# Patient Record
Sex: Male | Born: 1980 | Race: White | Hispanic: No | Marital: Married | Smoking: Current every day smoker
Health system: Southern US, Community
[De-identification: ages and names within clinical notes are randomized; demographics above are authoritative.]

## PROBLEM LIST (undated history)

## (undated) DIAGNOSIS — R0683 Snoring: Secondary | ICD-10-CM

## (undated) DIAGNOSIS — E6609 Other obesity due to excess calories: Secondary | ICD-10-CM

## (undated) DIAGNOSIS — G510 Bell's palsy: Secondary | ICD-10-CM

## (undated) DIAGNOSIS — I10 Essential (primary) hypertension: Secondary | ICD-10-CM

## (undated) HISTORY — DX: Snoring: R06.83

## (undated) HISTORY — PX: VASECTOMY: SHX75

## (undated) HISTORY — PX: WISDOM TOOTH EXTRACTION: SHX21

## (undated) HISTORY — DX: Other obesity due to excess calories: E66.09

---

## 2006-01-15 ENCOUNTER — Encounter: Admission: RE | Admit: 2006-01-15 | Discharge: 2006-01-15 | Payer: Self-pay | Admitting: Emergency Medicine

## 2006-02-04 ENCOUNTER — Emergency Department (HOSPITAL_COMMUNITY): Admission: EM | Admit: 2006-02-04 | Discharge: 2006-02-04 | Payer: Self-pay | Admitting: Emergency Medicine

## 2007-08-27 ENCOUNTER — Emergency Department (HOSPITAL_COMMUNITY): Admission: EM | Admit: 2007-08-27 | Discharge: 2007-08-27 | Payer: Self-pay | Admitting: Emergency Medicine

## 2010-02-20 ENCOUNTER — Other Ambulatory Visit: Payer: Self-pay | Admitting: Otolaryngology

## 2010-02-20 DIAGNOSIS — G510 Bell's palsy: Secondary | ICD-10-CM

## 2010-02-27 ENCOUNTER — Ambulatory Visit
Admission: RE | Admit: 2010-02-27 | Discharge: 2010-02-27 | Disposition: A | Discharge status: Home / Self Care | Payer: 59 | Source: Ambulatory Visit | Attending: Otolaryngology | Admitting: Otolaryngology

## 2010-02-27 DIAGNOSIS — G510 Bell's palsy: Secondary | ICD-10-CM

## 2010-02-27 MED ORDER — GADOBENATE DIMEGLUMINE 529 MG/ML IV SOLN
20.0000 mL | Freq: Once | INTRAVENOUS | Status: AC | PRN
  Administered 2010-02-27: 20 mL via INTRAVENOUS

## 2010-03-06 ENCOUNTER — Ambulatory Visit
Admission: RE | Admit: 2010-03-06 | Discharge: 2010-03-06 | Disposition: A | Discharge status: Home / Self Care | Payer: 59 | Source: Ambulatory Visit | Attending: Otolaryngology | Admitting: Otolaryngology

## 2010-03-06 ENCOUNTER — Other Ambulatory Visit: Payer: Self-pay | Admitting: Otolaryngology

## 2010-03-06 DIAGNOSIS — R590 Localized enlarged lymph nodes: Secondary | ICD-10-CM

## 2011-03-29 ENCOUNTER — Ambulatory Visit (INDEPENDENT_AMBULATORY_CARE_PROVIDER_SITE_OTHER): Payer: 59 | Admitting: Internal Medicine

## 2011-03-29 DIAGNOSIS — S335XXA Sprain of ligaments of lumbar spine, initial encounter: Secondary | ICD-10-CM

## 2011-03-29 MED ORDER — MELOXICAM 15 MG PO TABS: 15.0000 mg | ORAL_TABLET | Freq: Every day | ORAL | Status: DC

## 2011-03-29 MED ORDER — CYCLOBENZAPRINE HCL 10 MG PO TABS: 10.0000 mg | ORAL_TABLET | Freq: Every day | ORAL | Status: AC

## 2011-03-29 MED ORDER — HYDROCODONE-ACETAMINOPHEN 7.5-750 MG PO TABS: 1.0000 | ORAL_TABLET | Freq: Four times a day (QID) | ORAL | Status: AC | PRN

## 2011-03-29 NOTE — Progress Notes (Signed)
  Subjective:    Patient ID: Bradley Morales, male    DOB: [birth year], 29 y.o.   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 Also hurts to drive. Pain does not radiate from the low back area    Review of Systems     Objective:   Physical Exam Vital signs are stable/he is in obvious discomfort There is tenderness in the left lower back area over the iliac crest Straight leg raise is negative to 90 and hip exam is normal Pain is reproduced with twisting or forward bend       Assessment & Plan:  Acute low back muscle strain  Flexeril 10 at bedtime Mobic 15 daily Ice/heat/handout given for exercises Hydrocodone 7.5-750 to use every 6 hours as needed Followup one to 2 weeks if not well He prefers to try to continue to do his job without restrictions

## 2011-03-29 NOTE — Patient Instructions (Signed)
Back Exercises Back exercises help treat and prevent back injuries. The goal of back exercises is to increase the strength of your abdominal and back muscles and the flexibility of your back. These exercises should be started when you no longer have back pain. Back exercises include:  Pelvic Tilt. Lie on your back with your knees bent. Tilt your pelvis until the lower part of your back is against the floor. Hold this position 5 to 10 sec and repeat 5 to 10 times.   Knee to Chest. Pull first 1 knee up against your chest and hold for 20 to 30 seconds, repeat this with the other knee, and then both knees. This may be done with the other leg straight or bent, whichever feels better.   Sit-Ups or Curl-Ups. Bend your knees 90 degrees. Start with tilting your pelvis, and do a partial, slow sit-up, lifting your trunk only 30 to 45 degrees off the floor. Take at least 2 to 3 seconds for each sit-up. Do not do sit-ups with your knees out straight. If partial sit-ups are difficult, simply do the above but with only tightening your abdominal muscles and holding it as directed.   Hip-Lift. Lie on your back with your knees flexed 90 degrees. Push down with your feet and shoulders as you raise your hips a couple inches off the floor; hold for 10 seconds, repeat 5 to 10 times.   Back arches. Lie on your stomach, propping yourself up on bent elbows. Slowly press on your hands, causing an arch in your low back. Repeat 3 to 5 times. Any initial stiffness and discomfort should lessen with repetition over time.   Shoulder-Lifts. Lie face down with arms beside your body. Keep hips and torso pressed to floor as you slowly lift your head and shoulders off the floor.  Do not overdo your exercises, especially in the beginning. Exercises may cause you some mild back discomfort which lasts for a few minutes; however, if the pain is more severe, or lasts for more than 15 minutes, do not continue exercises until you see your  caregiver. Improvement with exercise therapy for back problems is slow.  See your caregivers for assistance with developing a proper back exercise program. Document Released: 02/12/2004 Document Revised: 12/24/2010 Document Reviewed: 01/04/2005 [employer] Patient Information 2012 [employer]. 

## 2011-04-11 ENCOUNTER — Ambulatory Visit: Payer: 59

## 2011-04-11 ENCOUNTER — Ambulatory Visit (INDEPENDENT_AMBULATORY_CARE_PROVIDER_SITE_OTHER): Payer: 59 | Admitting: Family Medicine

## 2011-04-11 ENCOUNTER — Encounter: Payer: Self-pay | Admitting: Family Medicine

## 2011-04-11 VITALS — BP 132/84 | HR 62 | Temp 98.3°F | Resp 18

## 2011-04-11 DIAGNOSIS — R55 Syncope and collapse: Secondary | ICD-10-CM

## 2011-04-11 DIAGNOSIS — W19XXXA Unspecified fall, initial encounter: Secondary | ICD-10-CM

## 2011-04-11 DIAGNOSIS — Y92009 Unspecified place in unspecified non-institutional (private) residence as the place of occurrence of the external cause: Secondary | ICD-10-CM

## 2011-04-11 DIAGNOSIS — M545 Low back pain, unspecified: Secondary | ICD-10-CM

## 2011-04-11 DIAGNOSIS — R079 Chest pain, unspecified: Secondary | ICD-10-CM

## 2011-04-11 MED ORDER — LISINOPRIL 40 MG PO TABS: 40.0000 mg | ORAL_TABLET | Freq: Every day | ORAL | Status: DC

## 2011-04-11 MED ORDER — TRAMADOL HCL 50 MG PO TABS
50.0000 mg | ORAL_TABLET | Freq: Three times a day (TID) | ORAL | Status: AC | PRN
Start: 2011-04-11 — End: 2011-04-21

## 2011-04-11 NOTE — Progress Notes (Signed)
31 yo man with hypertension who was voiding after getting up this morning around 10 am and passed out.  He hit left chest and mouth and cut the inside.   He takes atenolol He notes some left anterior rib pain and low back pain. O:  NAD  Patient has through and through lower lip laceration that is 2-3 mm wide and well approximated now on the outside:  dermabonded after cleansed with alcohol and water  No point tenderness or ecchymosis of ribs or back.  Breathing easily and moving well. Chest clear.  [facility] reading (PRIMARY) by  Dr. Milus Glazier  cxr :  neg [facility] reading (PRIMARY) by  Dr. Milus Glazier lumbar 2 view:  Neg   A:  Vasovagal syncope related to atenolol, time of day, and voiding  P:  Change bp med, give pain meds, explain etiology of syncope Increase fluids.

## 2011-11-03 ENCOUNTER — Encounter: Payer: Self-pay | Admitting: Internal Medicine

## 2011-11-03 ENCOUNTER — Ambulatory Visit (INDEPENDENT_AMBULATORY_CARE_PROVIDER_SITE_OTHER): Payer: 59 | Admitting: Internal Medicine

## 2011-11-03 VITALS — BP 122/70 | HR 76 | Temp 98.0°F | Resp 16 | Ht 69.5 in | Wt 321.8 lb

## 2011-11-03 DIAGNOSIS — I10 Essential (primary) hypertension: Secondary | ICD-10-CM

## 2011-11-03 DIAGNOSIS — Z6841 Body Mass Index (BMI) 40.0 and over, adult: Secondary | ICD-10-CM

## 2011-11-03 DIAGNOSIS — F17200 Nicotine dependence, unspecified, uncomplicated: Secondary | ICD-10-CM

## 2011-11-03 DIAGNOSIS — E669 Obesity, unspecified: Secondary | ICD-10-CM

## 2011-11-03 DIAGNOSIS — Z23 Encounter for immunization: Secondary | ICD-10-CM

## 2011-11-03 DIAGNOSIS — J019 Acute sinusitis, unspecified: Secondary | ICD-10-CM

## 2011-11-03 MED ORDER — HYDROCODONE-HOMATROPINE 5-1.5 MG/5ML PO SYRP: 5.0000 mL | ORAL_SOLUTION | Freq: Four times a day (QID) | ORAL | Status: AC | PRN

## 2011-11-03 MED ORDER — AMOXICILLIN 500 MG PO CAPS: 1000.0000 mg | ORAL_CAPSULE | Freq: Two times a day (BID) | ORAL | Status: AC

## 2011-11-03 MED ORDER — LISINOPRIL 20 MG PO TABS: 20.0000 mg | ORAL_TABLET | Freq: Every day | ORAL | Status: DC

## 2011-11-03 NOTE — Progress Notes (Signed)
Subjective:    Patient ID: Bradley Morales, male    DOB: December 12, 1980, 31 y.o.   MRN: [id number]  HPIHere for followup of hypertension/He has had episodes getting up at night where he would be very lightheaded/shows an extensive list of home blood pressure recordings and all are normal/some of the readings are around 105  systolic Also has had a cold with copious postnasal drainage and nocturnal cough for 8-10 days No fever/cough is mostly nonproductive  Continues to smoke almost a pack a day Wife and daughter doing well Work: Well  Review of Systems Has not been able to lose weight   Denies chest pain palpitations shortness of breath edema No headaches or visual changes Objective:   Physical Exam Blood pressure 122/70 Weight 321 pounds-BMI 46 TMs clear Nares with purulent discharge Maxillary sinus tender to percussion Lungs clear to auscultation Heart regular without murmur Extremities with good peripheral pulses and no edema       Assessment & Plan:   Patient Active Problem List  Diagnosis  . HTN (hypertension)  . Nicotine addiction  . BMI 45.0-49.9, adult    -  Sinusitis  Decrease Lisinopril to 20mg /? Hypotensive at night Meds ordered this encounter  Medications  . HYDROcodone-acetaminophen (NORCO/VICODIN) 5-325 MG per tablet    Sig:   . amoxicillin (AMOXIL) 500 MG capsule    Sig: Take 2 capsules (1,000 mg total) by mouth 2 (two) times daily.    Dispense:  40 capsule    Refill:  0  . HYDROcodone-homatropine (HYCODAN) 5-1.5 MG/5ML syrup    Sig: Take 5 mLs by mouth every 6 (six) hours as needed for cough.    Dispense:  120 mL    Refill:  0  . lisinopril (PRINIVIL,ZESTRIL) 20 MG tablet    Sig: Take 1 tablet (20 mg total) by mouth daily.    Dispense:  90 tablet    Refill:  3   Results for orders placed in visit on 11/03/11  CBC WITH DIFFERENTIAL      Component Value Range   WBC 7.3  4.0 - 10.5 K/uL   RBC 5.60  4.22 - 5.81 MIL/uL   Hemoglobin 15.9  13.0 - 17.0  g/dL   HCT 40.9  81.1 - 91.4 %   MCV 80.4  78.0 - 100.0 fL   MCH 28.4  26.0 - 34.0 pg   MCHC 35.3  30.0 - 36.0 g/dL   RDW 78.2  95.6 - 21.3 %   Platelets 216  150 - 400 K/uL   Neutrophils Relative 59  43 - 77 %   Neutro Abs 4.3  1.7 - 7.7 K/uL   Lymphocytes Relative 32  12 - 46 %   Lymphs Abs 2.3  0.7 - 4.0 K/uL   Monocytes Relative 8  3 - 12 %   Monocytes Absolute 0.6  0.1 - 1.0 K/uL   Eosinophils Relative 1  0 - 5 %   Eosinophils Absolute 0.1  0.0 - 0.7 K/uL   Basophils Relative 0  0 - 1 %   Basophils Absolute 0.0  0.0 - 0.1 K/uL   Smear Review Criteria for review not met    COMPREHENSIVE METABOLIC PANEL      Component Value Range   Sodium 140  135 - 145 mEq/L   Potassium 4.4  3.5 - 5.3 mEq/L   Chloride 104  96 - 112 mEq/L   CO2 26  19 - 32 mEq/L   Glucose, Bld 84  70 -  99 mg/dL   BUN 11  6 - 23 mg/dL   Creat 0.86  5.78 - 4.69 mg/dL   Total Bilirubin 0.5  0.3 - 1.2 mg/dL   Alkaline Phosphatase 63  39 - 117 U/L   AST 26  0 - 37 U/L   ALT 33  0 - 53 U/L   Total Protein 7.0  6.0 - 8.3 g/dL   Albumin 4.5  3.5 - 5.2 g/dL   Calcium 9.4  8.4 - 62.9 mg/dL  LIPID PANEL      Component Value Range   Cholesterol 167  0 - 200 mg/dL   Triglycerides 528 (*) <150 mg/dL   HDL 29 (*) >41 mg/dL   Total CHOL/HDL Ratio 5.8     VLDL 38  0 - 40 mg/dL   LDL Cholesterol 324 (*) 0 - 99 mg/dL   Howl notify him of Labs and a need to focus on weight loss and smoking cessation

## 2011-11-03 NOTE — Progress Notes (Deleted)
  Subjective:    Patient ID: Bradley Morales, male    DOB: 09/08/1980, 31 y.o.   MRN: [id number]  HPI    Review of Systems     Objective:   Physical Exam        Assessment & Plan:

## 2011-11-04 LAB — CBC WITH DIFFERENTIAL/PLATELET
Basophils Absolute: 0 10*3/uL (ref 0.0–0.1)
Basophils Relative: 0 % (ref 0–1)
Eosinophils Absolute: 0.1 10*3/uL (ref 0.0–0.7)
Eosinophils Relative: 1 % (ref 0–5)
Lymphocytes Relative: 32 % (ref 12–46)
Lymphs Abs: 2.3 10*3/uL (ref 0.7–4.0)
MCHC: 35.3 g/dL (ref 30.0–36.0)
Monocytes Relative: 8 % (ref 3–12)
Neutro Abs: 4.3 10*3/uL (ref 1.7–7.7)
WBC: 7.3 10*3/uL (ref 4.0–10.5)

## 2011-11-04 LAB — LIPID PANEL
Cholesterol: 167 mg/dL (ref 0–200)
HDL: 29 mg/dL — ABNORMAL LOW (ref >39)
LDL Cholesterol: 100 mg/dL — ABNORMAL HIGH (ref 0–99)
Total CHOL/HDL Ratio: 5.8 Ratio

## 2011-11-04 LAB — COMPREHENSIVE METABOLIC PANEL
Albumin: 4.5 g/dL (ref 3.5–5.2)
Alkaline Phosphatase: 63 U/L (ref 39–117)
Calcium: 9.4 mg/dL (ref 8.4–10.5)
Total Protein: 7 g/dL (ref 6.0–8.3)

## 2011-11-05 ENCOUNTER — Encounter: Payer: Self-pay | Admitting: Internal Medicine

## 2011-11-05 DIAGNOSIS — I10 Essential (primary) hypertension: Secondary | ICD-10-CM | POA: Insufficient documentation

## 2011-11-05 DIAGNOSIS — Z6841 Body Mass Index (BMI) 40.0 and over, adult: Secondary | ICD-10-CM | POA: Insufficient documentation

## 2011-11-05 DIAGNOSIS — F17200 Nicotine dependence, unspecified, uncomplicated: Secondary | ICD-10-CM | POA: Insufficient documentation

## 2011-11-06 ENCOUNTER — Encounter: Payer: Self-pay | Admitting: Internal Medicine

## 2011-11-23 ENCOUNTER — Encounter (HOSPITAL_COMMUNITY): Payer: Self-pay | Admitting: *Deleted

## 2011-11-23 DIAGNOSIS — R002 Palpitations: Secondary | ICD-10-CM | POA: Insufficient documentation

## 2011-11-23 DIAGNOSIS — Z79899 Other long term (current) drug therapy: Secondary | ICD-10-CM | POA: Insufficient documentation

## 2011-11-23 DIAGNOSIS — F17200 Nicotine dependence, unspecified, uncomplicated: Secondary | ICD-10-CM | POA: Insufficient documentation

## 2011-11-23 LAB — CBC WITH DIFFERENTIAL/PLATELET
Basophils Absolute: 0 10*3/uL (ref 0.0–0.1)
Eosinophils Absolute: 0.2 10*3/uL (ref 0.0–0.7)
Eosinophils Relative: 2 % (ref 0–5)
Lymphs Abs: 3.7 10*3/uL (ref 0.7–4.0)
MCV: 83.2 fL (ref 78.0–100.0)
Monocytes Absolute: 0.6 10*3/uL (ref 0.1–1.0)
RBC: 5.49 MIL/uL (ref 4.22–5.81)
RDW: 12.6 % (ref 11.5–15.5)

## 2011-11-23 LAB — POCT I-STAT TROPONIN I: Troponin i, poc: 0 ng/mL (ref 0.00–0.08)

## 2011-11-23 NOTE — ED Notes (Signed)
Pt states he was asleep and woke up with palpations. Pt states this is the first time this has happened to him. Pt states denies extra caffiene, stress, pt denies ETOH. Pt denies pain with the palpations, but states back pain unrelated for a week. Pt denies SOB, N/V.

## 2011-11-24 ENCOUNTER — Emergency Department (HOSPITAL_COMMUNITY)
Admission: EM | Admit: 2011-11-24 | Discharge: 2011-11-24 | Disposition: A | Discharge status: Home / Self Care | Payer: 59 | Attending: Emergency Medicine | Admitting: Emergency Medicine

## 2011-11-24 ENCOUNTER — Emergency Department (HOSPITAL_COMMUNITY): Payer: 59

## 2011-11-24 DIAGNOSIS — R002 Palpitations: Secondary | ICD-10-CM

## 2011-11-24 HISTORY — DX: Bell's palsy: G51.0

## 2011-11-24 HISTORY — DX: Essential (primary) hypertension: I10

## 2011-11-24 LAB — COMPREHENSIVE METABOLIC PANEL
AST: 22 U/L (ref 0–37)
Chloride: 100 mEq/L (ref 96–112)
GFR calc Af Amer: >90 mL/min (ref >90)
Glucose, Bld: 118 mg/dL — ABNORMAL HIGH (ref 70–99)
Sodium: 136 mEq/L (ref 135–145)

## 2011-11-24 NOTE — ED Notes (Signed)
Patient transported to X-ray 

## 2011-11-24 NOTE — ED Provider Notes (Signed)
History     CSN: [id number]  Arrival date & time 11/23/11  2303   First MD Initiated Contact with Patient 11/24/11 0154      Chief Complaint  Patient presents with  . Irregular Heart Beat    (Consider location/radiation/quality/duration/timing/severity/associated sxs/prior treatment) The history is provided by the patient.   31 year old male presents to emergency apartment with the complaint of heart palpitations. Patient was asleep and woke up with his heart going fast. This first time that this is ever happen no chest pain associated with it. No shortness of breath no nausea no vomiting. Patient's primary care doctors Dr. Merla Riches.  Past Medical History  Diagnosis Date  . Hypertension   . Bell's palsy     History reviewed. No pertinent past surgical history.  History reviewed. No pertinent family history.  History  Substance Use Topics  . Smoking status: Current Every Day Smoker -- 1.0 packs/day  . Smokeless tobacco: Not on file  . Alcohol Use: No      Review of Systems  Constitutional: Negative for fever and diaphoresis.  HENT: Negative for congestion and neck pain.   Eyes: Negative for visual disturbance.  Respiratory: Negative for shortness of breath.   Cardiovascular: Positive for palpitations. Negative for chest pain.  Gastrointestinal: Negative for nausea, vomiting and abdominal pain.  Genitourinary: Negative for dysuria.  Musculoskeletal: Positive for back pain.  Skin: Negative for rash.  Neurological: Negative for dizziness, seizures, syncope, speech difficulty, weakness, light-headedness and headaches.  Hematological: Does not bruise/bleed easily.    Allergies  Review of patient's allergies indicates no known allergies.  Home Medications   Current Outpatient Rx  Name  Route  Sig  Dispense  Refill  . VITAMIN C 1000 MG PO TABS   Oral   Take 1,000 mg by mouth daily.         Marland Kitchen VITAMIN D 1000 UNITS PO TABS   Oral   Take 2,000 Units by mouth  daily.         Marland Kitchen HYDROCODONE-ACETAMINOPHEN 5-500 MG PO TABS   Oral   Take 1 tablet by mouth daily as needed.         Marland Kitchen LISINOPRIL 40 MG PO TABS   Oral   Take 20 mg by mouth daily.           BP 134/76  Pulse 67  Temp 97.8 F (36.6 C) (Oral)  Resp 18  SpO2 100%  Physical Exam  Nursing note and vitals reviewed. Constitutional: He is oriented to person, place, and time. He appears well-developed and well-nourished. No distress.  HENT:  Head: Normocephalic and atraumatic.  Mouth/Throat: Oropharynx is clear and moist.  Eyes: Conjunctivae normal and EOM are normal. Pupils are equal, round, and reactive to light.  Neck: Normal range of motion. Neck supple.  Cardiovascular: Normal rate, regular rhythm, normal heart sounds and intact distal pulses.   No murmur heard. Pulmonary/Chest: Effort normal and breath sounds normal. No respiratory distress. He has no wheezes. He has no rales. He exhibits no tenderness.  Abdominal: Soft. Bowel sounds are normal.  Musculoskeletal: Normal range of motion.  Neurological: He is alert and oriented to person, place, and time. No cranial nerve deficit. He exhibits normal muscle tone. Coordination normal.  Skin: Skin is warm. No rash noted.    ED Course  Procedures (including critical care time)  Labs Reviewed  COMPREHENSIVE METABOLIC PANEL - Abnormal; Notable for the following:    Potassium 3.3 (*)     Glucose,  Bld 118 (*)     All other components within normal limits  CBC WITH DIFFERENTIAL  POCT I-STAT TROPONIN I   Dg Chest 2 View  11/24/2011  *RADIOLOGY REPORT*  Clinical Data: Irregular heartbeat and hypertension.  CHEST - 2 VIEW  Comparison: Chest radiograph performed 04/11/2011  Findings: The lungs are well-aerated and clear.  There is no evidence of focal opacification, pleural effusion or pneumothorax.  The heart is normal in size; the mediastinal contour is within normal limits.  No acute osseous abnormalities are seen.  IMPRESSION:  No acute cardiopulmonary process seen.   Original Report Authenticated By: Tonia Ghent, M.D.    Results for orders placed during the hospital encounter of 11/24/11  CBC WITH DIFFERENTIAL      Component Value Range   WBC 9.4  4.0 - 10.5 K/uL   RBC 5.49  4.22 - 5.81 MIL/uL   Hemoglobin 15.9  13.0 - 17.0 g/dL   HCT 16.1  09.6 - 04.5 %   MCV 83.2  78.0 - 100.0 fL   MCH 29.0  26.0 - 34.0 pg   MCHC 34.8  30.0 - 36.0 g/dL   RDW 40.9  81.1 - 91.4 %   Platelets 182  150 - 400 K/uL   Neutrophils Relative 53  43 - 77 %   Neutro Abs 5.0  1.7 - 7.7 K/uL   Lymphocytes Relative 40  12 - 46 %   Lymphs Abs 3.7  0.7 - 4.0 K/uL   Monocytes Relative 6  3 - 12 %   Monocytes Absolute 0.6  0.1 - 1.0 K/uL   Eosinophils Relative 2  0 - 5 %   Eosinophils Absolute 0.2  0.0 - 0.7 K/uL   Basophils Relative 0  0 - 1 %   Basophils Absolute 0.0  0.0 - 0.1 K/uL  COMPREHENSIVE METABOLIC PANEL      Component Value Range   Sodium 136  135 - 145 mEq/L   Potassium 3.3 (*) 3.5 - 5.1 mEq/L   Chloride 100  96 - 112 mEq/L   CO2 25  19 - 32 mEq/L   Glucose, Bld 118 (*) 70 - 99 mg/dL   BUN 13  6 - 23 mg/dL   Creatinine, Ser 7.82  0.50 - 1.35 mg/dL   Calcium 9.0  8.4 - 95.6 mg/dL   Total Protein 7.2  6.0 - 8.3 g/dL   Albumin 4.0  3.5 - 5.2 g/dL   AST 22  0 - 37 U/L   ALT 30  0 - 53 U/L   Alkaline Phosphatase 69  39 - 117 U/L   Total Bilirubin 0.3  0.3 - 1.2 mg/dL   GFR calc non Af Amer >90  >90 mL/min   GFR calc Af Amer >90  >90 mL/min  POCT I-STAT TROPONIN I      Component Value Range   Troponin i, poc 0.00  0.00 - 0.08 ng/mL   Comment 3             Date: 11/24/2011  Rate: 94  Rhythm: normal sinus rhythm and sinus arrhythmia  QRS Axis: normal  Intervals: normal  ST/T Wave abnormalities: nonspecific ST changes  Conduction Disutrbances:none  Narrative Interpretation:   Old EKG Reviewed: unchanged From 02/04/06   1. Heart palpitations       MDM  Patient with a history of heart palpitations rapid  heart rate. Resolved by the time he get here. He started shortly before arrival. Patient has any problems  with these in the past. No chest pain associated with it. Workup without any specific findings. Patient's primary care Dr. to followup with. He will return for recurrent symptoms lasting 40 minutes or longer.        Shelda Jakes, MD 11/24/11 [phone]

## 2011-11-29 ENCOUNTER — Ambulatory Visit (INDEPENDENT_AMBULATORY_CARE_PROVIDER_SITE_OTHER): Payer: 59 | Admitting: Internal Medicine

## 2011-11-29 ENCOUNTER — Encounter: Payer: Self-pay | Admitting: Internal Medicine

## 2011-11-29 VITALS — BP 142/82 | HR 79 | Temp 97.6°F | Resp 16 | Ht 71.5 in | Wt 328.0 lb

## 2011-11-29 DIAGNOSIS — E876 Hypokalemia: Secondary | ICD-10-CM

## 2011-11-29 DIAGNOSIS — M545 Low back pain: Secondary | ICD-10-CM

## 2011-11-29 DIAGNOSIS — R002 Palpitations: Secondary | ICD-10-CM

## 2011-11-29 LAB — POCT CBC
Granulocyte percent: 62 %G (ref 37–80)
Hemoglobin: 15 g/dL (ref 14.1–18.1)
Lymph, poc: 3.3 (ref 0.6–3.4)
Platelet Count, POC: 258 10*3/uL (ref 142–424)
RBC: 5.37 M/uL (ref 4.69–6.13)
RDW, POC: 13.4 %
WBC: 10.3 10*3/uL — AB (ref 4.6–10.2)

## 2011-11-29 NOTE — Progress Notes (Signed)
  Subjective:    Patient ID: Bradley Morales, male    DOB: [birth year], 31 y.o.   MRN: [id number]  HPIFollowup after emergency room visit Awakened in the overnight with a heart rate of 150 and a vague sense of disease No chest pain nausea vomiting diaphoresis ER observed overnight with no arrhythmia noted and both heart rate and blood pressure stable/chest x-ray was normal/EKG was normal/labs were normal except potassium 3.3 He has remained fine since discharge but is perplexed about what happened  He's also had back pain from lifting at work and has taken some leftover Vicodin for the discomfort He is stiff in the morning but improves during the day unless he sits for a long time and in his back will ache again There no peripheral neuro symptoms    Review of Systems No fever or night sweats No weight loss No chest pain or dizziness No new fatigue   Continues to smoke Objective:   Physical Exam Filed Vitals:   11/29/11 1836  BP: 142/82  Pulse: 79  Temp: 97.6 F (36.4 C)  Resp: 16   Weight 328 Chest clear Heart regular without murmur No thyromegaly       Assessment & Plan:   Patient Active Problem List  Diagnosis  . HTN (hypertension)  . Nicotine addiction  . BMI 45.0-49.9, adult     -  History of palpitations/probable PAT   -  Lumbar muscle strain   -  Mild hypokalemia unclear etiology  Started back on exercise program for back Explained Valsalva maneuvers if palpitations recur Will order Holter monitor if this happens Recheck potassium Check TSH

## 2011-11-30 LAB — COMPREHENSIVE METABOLIC PANEL
AST: 21 U/L (ref 0–37)
Albumin: 4.8 g/dL (ref 3.5–5.2)
Alkaline Phosphatase: 59 U/L (ref 39–117)
BUN: 13 mg/dL (ref 6–23)
CO2: 26 mEq/L (ref 19–32)
Chloride: 102 mEq/L (ref 96–112)
Creat: 0.9 mg/dL (ref 0.50–1.35)
Glucose, Bld: 78 mg/dL (ref 70–99)
Total Bilirubin: 0.5 mg/dL (ref 0.3–1.2)

## 2011-11-30 LAB — TSH: TSH: 0.762 u[IU]/mL (ref 0.350–4.500)

## 2011-12-29 ENCOUNTER — Ambulatory Visit (INDEPENDENT_AMBULATORY_CARE_PROVIDER_SITE_OTHER): Payer: 59 | Admitting: Internal Medicine

## 2011-12-29 ENCOUNTER — Encounter: Payer: Self-pay | Admitting: Internal Medicine

## 2011-12-29 VITALS — BP 126/85 | HR 81 | Temp 98.1°F | Resp 16 | Ht 71.0 in | Wt 330.0 lb

## 2011-12-29 DIAGNOSIS — R202 Paresthesia of skin: Secondary | ICD-10-CM

## 2011-12-29 DIAGNOSIS — R1011 Right upper quadrant pain: Secondary | ICD-10-CM

## 2011-12-29 DIAGNOSIS — Z6841 Body Mass Index (BMI) 40.0 and over, adult: Secondary | ICD-10-CM

## 2011-12-29 LAB — COMPREHENSIVE METABOLIC PANEL
ALT: 22 U/L (ref 0–53)
AST: 16 U/L (ref 0–37)
Albumin: 4.3 g/dL (ref 3.5–5.2)
Alkaline Phosphatase: 65 U/L (ref 39–117)
Potassium: 4.2 mEq/L (ref 3.5–5.3)
Sodium: 140 mEq/L (ref 135–145)
Total Bilirubin: 0.4 mg/dL (ref 0.3–1.2)
Total Protein: 6.9 g/dL (ref 6.0–8.3)

## 2011-12-29 NOTE — Progress Notes (Signed)
  Subjective:    Patient ID: Bradley Morales, male    DOB: 06-07-1980, 31 y.o.   MRN: [id number]  HPI complaining of right upper quadrant abdominal pain postprandially over the past month No bloating or burping/no reflux/unsure if certain food groups cause this Resolves in one to 2 hours/no nausea or vomiting/no nocturnal symptoms  Also complaining of occasional numbness in the left arm when he wakes in the morning especially after sleeping on his stomach/no neck or shoulder pain  Continues to smoke but is not interested in quitting at this point He has switched to electronic cigarettes most of the time and feels that this has allowed his appetite to increase and for him to start gaining weight= 10 pounds since October Would be interested in a supplement to treat this    Review of Systems No fever chills night sweats fatigue No chest pain or shortness of breath No peripheral edema No diarrhea or constipation/no bloody bowel movements No genitourinary symptoms    Objective:   Physical Exam Overweight at 330 pounds Rest of vital signs are stable The abdomen is soft nontender and nondistended with no organomegaly or masses No CVA tenderness to percussion The left arm has full sensation and full motor/shoulder intact/wrist intact Extension of the neck is limited by discomfort which radiates toward the left shoulder mildly       Assessment & Plan:  Problem #1 right upper quadrant abdominal pain Check CBC and metabolic profile to consider gallbladder disease  Problem #2 paresthesias-related to cervical spondylosis and sleep position  Problem #3 overweight-discussede the addition of green tea and flaxseed  Will schedule gallbladder ultrasound in addition to labs

## 2011-12-30 LAB — CBC WITH DIFFERENTIAL/PLATELET
Basophils Absolute: 0 10*3/uL (ref 0.0–0.1)
Basophils Relative: 0 % (ref 0–1)
Eosinophils Absolute: 0.1 10*3/uL (ref 0.0–0.7)
Hemoglobin: 15.2 g/dL (ref 13.0–17.0)
MCH: 28.2 pg (ref 26.0–34.0)
MCHC: 34.4 g/dL (ref 30.0–36.0)
Monocytes Relative: 9 % (ref 3–12)
Neutro Abs: 4.7 10*3/uL (ref 1.7–7.7)
Neutrophils Relative %: 57 % (ref 43–77)
Platelets: 228 10*3/uL (ref 150–400)

## 2011-12-31 NOTE — Addendum Note (Signed)
Addended by: Tonye Pearson on: 12/31/2011 01:28 PM   Modules accepted: Orders

## 2012-01-07 ENCOUNTER — Encounter: Payer: Self-pay | Admitting: Internal Medicine

## 2012-01-07 ENCOUNTER — Ambulatory Visit
Admission: RE | Admit: 2012-01-07 | Discharge: 2012-01-07 | Disposition: A | Discharge status: Home / Self Care | Payer: 59 | Source: Ambulatory Visit | Attending: Internal Medicine | Admitting: Internal Medicine

## 2012-01-07 DIAGNOSIS — R1011 Right upper quadrant pain: Secondary | ICD-10-CM

## 2012-03-06 ENCOUNTER — Ambulatory Visit (INDEPENDENT_AMBULATORY_CARE_PROVIDER_SITE_OTHER): Payer: 59 | Admitting: Internal Medicine

## 2012-03-06 VITALS — BP 128/80 | HR 111 | Temp 97.9°F | Resp 20 | Ht 70.0 in | Wt 327.2 lb

## 2012-03-06 DIAGNOSIS — L309 Dermatitis, unspecified: Secondary | ICD-10-CM

## 2012-03-06 DIAGNOSIS — M79609 Pain in unspecified limb: Secondary | ICD-10-CM

## 2012-03-06 DIAGNOSIS — L259 Unspecified contact dermatitis, unspecified cause: Secondary | ICD-10-CM

## 2012-03-06 DIAGNOSIS — M79606 Pain in leg, unspecified: Secondary | ICD-10-CM

## 2012-03-06 DIAGNOSIS — G4733 Obstructive sleep apnea (adult) (pediatric): Secondary | ICD-10-CM | POA: Insufficient documentation

## 2012-03-06 MED ORDER — FLUOCINONIDE-E 0.05 % EX CREA: TOPICAL_CREAM | Freq: Two times a day (BID) | CUTANEOUS | Status: DC

## 2012-03-06 NOTE — Progress Notes (Signed)
  Subjective:    Patient ID: Bradley Morales, male    DOB: 05/17/1980, 32 y.o.   MRN: [id number]  HPI pain and right shin for 5 days following injury Swollen/bruising Gait okay  Issue lesion left anterior thigh for one month/not spreading/no new exposures  Patient Active Problem List  Diagnosis  . HTN (hypertension)  . Nicotine addiction  . BMI 45.0-49.9, adult  . OSA (obstructive sleep apnea)   stable for now    Review of Systems No new symptoms    Objective:   Physical Exam Vital signs stable except obese Right anterior shin with ecchymoses and mild swelling over the mid quadrant/tender to touch Stressors to the bone are non-painful/gait is normal Left anterior thigh with a 1 cm area of scaling without clearly demarcated borders and no central clearing       Assessment & Plan:  Problem #1 contusion leg Should resolve without treatment Problem #2 nummular eczema Lidex cream for 2 weeks  Followup as planned for other problems

## 2012-03-15 ENCOUNTER — Emergency Department (HOSPITAL_COMMUNITY)
Admission: EM | Admit: 2012-03-15 | Discharge: 2012-03-15 | Disposition: A | Discharge status: Home / Self Care | Payer: 59 | Attending: Emergency Medicine | Admitting: Emergency Medicine

## 2012-03-15 ENCOUNTER — Encounter (HOSPITAL_COMMUNITY): Payer: Self-pay | Admitting: *Deleted

## 2012-03-15 ENCOUNTER — Emergency Department (HOSPITAL_COMMUNITY): Payer: 59

## 2012-03-15 DIAGNOSIS — I10 Essential (primary) hypertension: Secondary | ICD-10-CM | POA: Insufficient documentation

## 2012-03-15 DIAGNOSIS — F17200 Nicotine dependence, unspecified, uncomplicated: Secondary | ICD-10-CM | POA: Insufficient documentation

## 2012-03-15 DIAGNOSIS — I498 Other specified cardiac arrhythmias: Secondary | ICD-10-CM | POA: Insufficient documentation

## 2012-03-15 DIAGNOSIS — R002 Palpitations: Secondary | ICD-10-CM

## 2012-03-15 DIAGNOSIS — Z8669 Personal history of other diseases of the nervous system and sense organs: Secondary | ICD-10-CM | POA: Insufficient documentation

## 2012-03-15 DIAGNOSIS — E860 Dehydration: Secondary | ICD-10-CM

## 2012-03-15 DIAGNOSIS — Z79899 Other long term (current) drug therapy: Secondary | ICD-10-CM | POA: Insufficient documentation

## 2012-03-15 DIAGNOSIS — R42 Dizziness and giddiness: Secondary | ICD-10-CM | POA: Insufficient documentation

## 2012-03-15 LAB — COMPREHENSIVE METABOLIC PANEL
AST: 20 U/L (ref 0–37)
Albumin: 4.4 g/dL (ref 3.5–5.2)
BUN: 14 mg/dL (ref 6–23)
Chloride: 103 mEq/L (ref 96–112)
Creatinine, Ser: 1 mg/dL (ref 0.50–1.35)
GFR calc Af Amer: >90 mL/min (ref >90)
Glucose, Bld: 101 mg/dL — ABNORMAL HIGH (ref 70–99)
Sodium: 139 mEq/L (ref 135–145)
Total Bilirubin: 0.4 mg/dL (ref 0.3–1.2)
Total Protein: 8 g/dL (ref 6.0–8.3)

## 2012-03-15 LAB — CBC WITH DIFFERENTIAL/PLATELET
Basophils Absolute: 0 10*3/uL (ref 0.0–0.1)
Basophils Relative: 0 % (ref 0–1)
Eosinophils Absolute: 0.1 10*3/uL (ref 0.0–0.7)
HCT: 46.3 % (ref 39.0–52.0)
Hemoglobin: 16.5 g/dL (ref 13.0–17.0)
MCH: 29.8 pg (ref 26.0–34.0)
MCHC: 35.6 g/dL (ref 30.0–36.0)
Monocytes Absolute: 0.7 10*3/uL (ref 0.1–1.0)
Monocytes Relative: 6 % (ref 3–12)
Neutro Abs: 6.8 10*3/uL (ref 1.7–7.7)
Neutrophils Relative %: 65 % (ref 43–77)
RDW: 12.7 % (ref 11.5–15.5)

## 2012-03-15 LAB — POCT I-STAT TROPONIN I
Troponin i, poc: 0 ng/mL (ref 0.00–0.08)
Troponin i, poc: 0 ng/mL (ref 0.00–0.08)

## 2012-03-15 MED ORDER — SODIUM CHLORIDE 0.9 % IV BOLUS (SEPSIS)
1000.0000 mL | Freq: Once | INTRAVENOUS | Status: AC
  Administered 2012-03-15: 1000 mL via INTRAVENOUS

## 2012-03-15 NOTE — ED Notes (Signed)
Pt unhooked from monitor to go to restroom.

## 2012-03-15 NOTE — ED Notes (Signed)
Patient says he started feeling light headedness and

## 2012-03-15 NOTE — ED Notes (Signed)
Pt was at work and felt heart racing with dizziness.  Pt states some 'muscle pain' on left chest side,  No sob

## 2012-03-15 NOTE — ED Provider Notes (Signed)
Medical screening examination/treatment/procedure(s) were performed by non-physician practitioner and as supervising physician I was immediately available for consultation/collaboration.   Richardean Canal, MD 03/15/12 2104

## 2012-03-15 NOTE — ED Notes (Signed)
Patient is alert and orientedx4.  Patient was explained discharge instructions and they understood them with no questions.  Melissa Linson, his wife is taking him home.

## 2012-03-15 NOTE — ED Provider Notes (Signed)
History     CSN: [id number]  Arrival date & time 03/15/12  1430   First MD Initiated Contact with Patient 03/15/12 1736      Chief Complaint  Patient presents with  . Heart Racing     (Consider location/radiation/quality/duration/timing/severity/associated sxs/prior treatment) The history is provided by the patient, the spouse and medical records. No language interpreter was used.   Bradley Morales is a 32 y.o. male  with a hx of hypertension, Bell's palsy presents to the Emergency Department complaining of acute onset of lightheadedness and palpitations approximately one hour prior to arrival which resolved spontaneously within 20 minutes. He is in no other associated symptoms.  Nothing makes it better and nothing makes it worse.  Patient states he attempted vagal maneuvers as was recommended to him previously by his primary care physician and he is unsure whether or not these helped. Pt denies fever, chills, neck pain, chest pain, shortness of breath, abdominal pain, nausea, vomiting, diarrhea, weakness, dizziness, syncope, dysuria, hematuria.  Patient was seen and evaluated for an episode similar to this several months ago. The emergency department felt as if his symptoms may be treated to low potassium which they were placed in the department and he followup with his primary care physician who rechecked potassium which was within normal limits. He has not been reevaluated by cardiologist.  He has no personal or family cardiac history.  Patient smokes cigarettes, greater than one pack per day and drinks 4-6, 20 oz  Dr. Alcus Dad per day without any water intake.   Past Medical History  Diagnosis Date  . Hypertension   . Bell's palsy     Past Surgical History  Procedure Laterality Date  . Vasectomy      Family History  Problem Relation Age of Onset  . Hypertension Mother   . Hypertension Father     History  Substance Use Topics  . Smoking status: Current Every Day Smoker  .  Smokeless tobacco: Not on file     Comment: uses an electronic cigarette  . Alcohol Use: No      Review of Systems  Constitutional: Negative for fever, diaphoresis, appetite change, fatigue and unexpected weight change.  HENT: Negative for mouth sores and neck stiffness.   Eyes: Negative for visual disturbance.  Respiratory: Negative for cough, chest tightness, shortness of breath and wheezing.   Cardiovascular: Positive for palpitations. Negative for chest pain and leg swelling.  Gastrointestinal: Negative for nausea, vomiting, abdominal pain, diarrhea and constipation.  Endocrine: Negative for polydipsia, polyphagia and polyuria.  Genitourinary: Negative for dysuria, urgency, frequency and hematuria.  Musculoskeletal: Negative for back pain.  Skin: Negative for rash.  Allergic/Immunologic: Negative for immunocompromised state.  Neurological: Positive for light-headedness. Negative for syncope and headaches.  Hematological: Does not bruise/bleed easily.  Psychiatric/Behavioral: Negative for sleep disturbance. The patient is not nervous/anxious.   All other systems reviewed and are negative.    Allergies  Review of patient's allergies indicates no known allergies.  Home Medications   Current Outpatient Rx  Name  Route  Sig  Dispense  Refill  . Ascorbic Acid (VITAMIN C) 1000 MG tablet   Oral   Take 1,000 mg by mouth daily.         Janene Madeira Yeast TABS   Oral   Take 1 tablet by mouth daily.          . cholecalciferol (VITAMIN D) 1000 UNITS tablet   Oral   Take 2,000 Units by mouth daily.         Marland Kitchen  Flaxseed, Linseed, (FLAX SEED OIL PO)   Oral   Take 1 tablet by mouth daily.         . fluocinonide-emollient (LIDEX-E) 0.05 % cream   Topical   Apply 1 application topically 2 (two) times daily.         Chilton Si Tea, Camillia sinensis, (GREEN TEA PO)   Oral   Take 1 capsule by mouth daily.         Marland Kitchen lisinopril (PRINIVIL,ZESTRIL) 20 MG tablet   Oral   Take  20 mg by mouth daily.           BP 147/80  Pulse 100  Temp(Src) 98.2 F (36.8 C) (Oral)  Resp 14  SpO2 100%  Physical Exam  Nursing note and vitals reviewed. Constitutional: He is oriented to person, place, and time. He appears well-developed and well-nourished. No distress.  HENT:  Head: Normocephalic and atraumatic.  Mouth/Throat: Oropharynx is clear and moist. No oropharyngeal exudate.  Eyes: Conjunctivae and EOM are normal. Pupils are equal, round, and reactive to light. No scleral icterus.  Neck: Normal range of motion. Neck supple. No muscular tenderness present. Normal range of motion present.  Cardiovascular: Normal rate, S1 normal, S2 normal, normal heart sounds and intact distal pulses.  An irregular rhythm present. Exam reveals no gallop and no friction rub.   No murmur heard. Pulses:      Radial pulses are 2+ on the right side, and 2+ on the left side.       Dorsalis pedis pulses are 2+ on the right side, and 2+ on the left side.       Posterior tibial pulses are 2+ on the right side, and 2+ on the left side.  Sinus arrhythmia on monitor  Pulmonary/Chest: Effort normal and breath sounds normal. No accessory muscle usage. Not tachypneic. No respiratory distress. He has no decreased breath sounds. He has no wheezes. He has no rhonchi. He has no rales. He exhibits no tenderness.  Abdominal: Soft. Bowel sounds are normal. He exhibits no distension and no mass. There is no tenderness. There is no rigidity, no rebound, no guarding, no CVA tenderness, no tenderness at McBurney's point and negative Murphy's sign.  Musculoskeletal: Normal range of motion. He exhibits no edema and no tenderness.  Lymphadenopathy:    He has no cervical adenopathy.  Neurological: He is alert and oriented to person, place, and time. No cranial nerve deficit. He exhibits normal muscle tone. Coordination normal.  Speech is clear and goal oriented Moves extremities without ataxia  Skin: Skin is warm  and dry. No rash noted. He is not diaphoretic. No erythema.  Psychiatric: He has a normal mood and affect.    ED Course  Procedures (including critical care time)  Labs Reviewed  COMPREHENSIVE METABOLIC PANEL - Abnormal; Notable for the following:    Glucose, Bld 101 (*)    All other components within normal limits  CBC WITH DIFFERENTIAL  POCT I-STAT TROPONIN I  POCT I-STAT TROPONIN I   Dg Chest 2 View  03/15/2012  *RADIOLOGY REPORT*  Clinical Data: Chest pain.  CHEST - 2 VIEW  Comparison: The PA and lateral chest 11/24/2011.  Findings: Lungs are clear.  Heart size is normal.  No pneumothorax or pleural effusion.  IMPRESSION: Negative chest.   Original Report Authenticated By: Holley Dexter, M.D.    ECG:   Date: 03/15/2012  Rate: 98  Rhythm: sinus arrhythmia  QRS Axis: normal  Intervals: normal  ST/T Wave abnormalities:  nonspecific ST changes  Conduction Disutrbances:none  Narrative Interpretation:  Mild ST depression II, unchanged from previous  Old EKG Reviewed: unchanged '   1. Palpitations   2. Dehydration       MDM  Bradley C Prins presents after episode of palpitations and lightheadedness. Patient history consistent with episode of SVT.  Troponin and delta troponin negative, CMP unremarkable. The patient is not hypokalemic. CBC unremarkable, ECG nonischemic.  Chest x-ray without evidence of pneumothorax, pleural effusion, cardiomegaly or infiltrate.  Patient orthostatic here in the department.  1 L normal saline administered. Patient technically orthostatic with increase in heart rate from 71-100 however he is not tachycardic and his blood pressure remained stable during orthostatics.  Patient does not want to stay for further fluid administration. The patient is likely chronically dehydrated that may have triggered today's episode. I recommended followup with cardiology for further evaluation.  I have also discussed reasons to return immediately to the ER.  Patient  expresses understanding and agrees with plan.   1. Medications: usual home medications 2. Treatment: rest, drink plenty of fluids, decrease caffeine and nicotine intake. 3. Follow Up: Please followup with your primary doctor for discussion of your diagnoses and further evaluation after today's visit; followup with [facility] cardiology for further evaluation of today's event      Dierdre Forth, PA-C 03/15/12 2049

## 2012-03-19 ENCOUNTER — Ambulatory Visit (INDEPENDENT_AMBULATORY_CARE_PROVIDER_SITE_OTHER): Payer: 59 | Admitting: Internal Medicine

## 2012-03-19 VITALS — BP 116/76 | HR 80 | Temp 98.0°F | Resp 16 | Ht 71.5 in | Wt 326.0 lb

## 2012-03-19 DIAGNOSIS — I10 Essential (primary) hypertension: Secondary | ICD-10-CM

## 2012-03-19 MED ORDER — CYCLOBENZAPRINE HCL 10 MG PO TABS: 10.0000 mg | ORAL_TABLET | Freq: Every day | ORAL | Status: DC

## 2012-03-20 NOTE — Progress Notes (Signed)
  Subjective:    Patient ID: Bradley Morales, male    DOB: Oct 10, 1980, 32 y.o.   MRN: [id number]  HPIf/u p er visit This was a second recent visit for him to ER for reasons that included feeling dizzy with the sense that something was wrong with his heart. Both evaluations there was a suggestion that dehydration played a role, but he has no reasons that he might be dehydrated and no changes in what he usually does. 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 He brings in a list of blood pressures with Pulse since his last ER visit and over these last several days everything has been normal. He continues on Lisinopril 20(down from 40 a few months ago)   Review of Systems He describes waking with paresthesias in his left hand and neck stiffness in recent weeks. He has had ongoing problems with his neck and back associated with work but has never had the diagnosis of a herniated disc has never had any motor function problems or persistent sensory problems or even persistent pain.    Objective:   Physical Exam BP 116/76  Pulse 80  Temp(Src) 98 F (36.7 C) (Oral)  Resp 16  Ht 5' 11.5" (1.816 m)  Wt 326 lb (147.873 kg)  BMI 44.84 kg/m2 HEENT clear Heart regular without murmur or click No peripheral edema Neck has a good range of motion without radicular symptoms      Assessment & Plan:  Problem #1 recurrent episodes of dizziness He has an appointment with Dr. Graciela Husbands for cardiac evaluation  Problem #2 hypertension Problem #3 nicotine use Problem #4 near morbid obesity Problem #5 history of sleep apnea/rarely uses mask Problem #6 increasing neck pain--to start exercises, and use Flexeril at night. Recheck 1 month. Regardless of the cause of these episodes he clearly has a multitude of risk factors to address with regard to his health. He is hard-working and well-meaning, and a good husband and father, but he has no motivation to lose weight, quit  smoking, improve his sleep apnea.

## 2012-04-01 ENCOUNTER — Ambulatory Visit (INDEPENDENT_AMBULATORY_CARE_PROVIDER_SITE_OTHER): Payer: 59 | Admitting: Emergency Medicine

## 2012-04-01 VITALS — BP 122/80 | HR 86 | Temp 97.9°F | Resp 16 | Ht 70.0 in | Wt 323.0 lb

## 2012-04-01 DIAGNOSIS — L309 Dermatitis, unspecified: Secondary | ICD-10-CM

## 2012-04-01 DIAGNOSIS — R21 Rash and other nonspecific skin eruption: Secondary | ICD-10-CM

## 2012-04-01 DIAGNOSIS — L259 Unspecified contact dermatitis, unspecified cause: Secondary | ICD-10-CM

## 2012-04-01 LAB — POCT SKIN KOH: Skin KOH, POC: NEGATIVE

## 2012-04-01 MED ORDER — TRIAMCINOLONE 0.1 % CREAM:EUCERIN CREAM 1:1: 1.0000 "application " | TOPICAL_CREAM | Freq: Three times a day (TID) | CUTANEOUS | Status: DC

## 2012-04-01 NOTE — Patient Instructions (Addendum)

## 2012-04-01 NOTE — Progress Notes (Signed)
  Subjective:    Patient ID: Bradley Morales, male    DOB: 23-Sep-1980, 32 y.o.   MRN: [id number]  HPI here with a rash on his left anterior thigh. He's been using Lidex cream but apparently this causes irritation. The area does not hurt but it doesn't itch. He has had no other rash develop    Review of Systems     Objective:   Physical Exam There is a 3 x 3" circular dry patch of skin on the top of his left thigh the area is not indurated and shows no signs of infection. KOH prep was obtained.  Results for orders placed in visit on 04/01/12  POCT SKIN KOH      Result Value Range   Skin KOH, POC Negative         Assessment & Plan:  This looks like a patch of eczema. Will treat with combination of triamcinolone and Eucerin and see with a moisturizer it will improve . If this does not resolve the problems we'll go ahead and biopsy the area

## 2012-04-06 ENCOUNTER — Encounter: Payer: Self-pay | Admitting: Internal Medicine

## 2012-04-06 ENCOUNTER — Ambulatory Visit (INDEPENDENT_AMBULATORY_CARE_PROVIDER_SITE_OTHER): Payer: 59 | Admitting: Internal Medicine

## 2012-04-06 VITALS — BP 134/79 | HR 83 | Ht 70.0 in | Wt 330.1 lb

## 2012-04-06 DIAGNOSIS — R002 Palpitations: Secondary | ICD-10-CM

## 2012-04-06 DIAGNOSIS — Z6841 Body Mass Index (BMI) 40.0 and over, adult: Secondary | ICD-10-CM

## 2012-04-06 NOTE — Progress Notes (Signed)
ELECTROPHYSIOLOGY CONSULT NOTE  Patient ID: Bradley Morales, MRN: [id number], DOB/AGE: August 29, 1980 32 y.o. Admit date: (Not on file) Date of Consult: 04/06/2012  Primary Physician: Tonye Pearson, MD Primary Cardiologist: new  Chief Complaint: heart racing   HPI Bradley Morales is a 32 y.o. male  Referred from the emergency room having presented in February 2014 with a history of lightheadedness and palpitations it lasted about an hour.  He also had a similar spell in the fall of 2013 when he awakened at night. He take his blood pressure although somewhat elevated as was his heart rate--about 120. By the time he got to the emergency room on both occasions his heart rate has normalized.  Is not particularly sit. He has poorly treated sleep apnea. He has hypertension.      Past Medical History  Diagnosis Date  . Hypertension   . Bell's palsy       Surgical History:  Past Surgical History  Procedure Laterality Date  . Vasectomy       Home Meds: Prior to Admission medications   Medication Sig Start Date End Date Taking? Authorizing Provider  Ascorbic Acid (VITAMIN C) 1000 MG tablet Take 1,000 mg by mouth daily.   Yes Historical Provider, MD  Brewers Yeast TABS Take 1 tablet by mouth daily.    Yes Historical Provider, MD  cholecalciferol (VITAMIN D) 1000 UNITS tablet Take 2,000 Units by mouth daily.   Yes Historical Provider, MD  cyclobenzaprine (FLEXERIL) 10 MG tablet Take 1 tablet (10 mg total) by mouth at bedtime. 03/19/12  Yes Tonye Pearson, MD  Flaxseed, Linseed, (FLAX SEED OIL PO) Take 1 tablet by mouth daily.   Yes Historical Provider, MD  Chilton Si Tea, Camillia sinensis, (GREEN TEA PO) Take 1 capsule by mouth daily.   Yes Historical Provider, MD  lisinopril (PRINIVIL,ZESTRIL) 20 MG tablet Take 20 mg by mouth daily.   Yes Historical Provider, MD  Triamcinolone Acetonide (TRIAMCINOLONE 0.1 % CREAM : EUCERIN) CREA Apply 1 application topically 3 (three) times daily. 04/01/12   Yes Collene Gobble, MD    Allergies: No Known Allergies  History   Social History  . Marital Status: Married    Spouse Name: N/A    Number of Children: N/A  . Years of Education: N/A   Occupational History  . 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 Smoking status: Current Every Day Smoker  . Smokeless tobacco: Not on file     Comment: uses an electronic cigarette  . Alcohol Use: No  . Drug Use: No  . Sexually Active: Yes     Comment: 1 partner in last 12 months   Other Topics Concern  . Not on file   Social History Narrative  . No narrative on file     Family History  Problem Relation Age of Onset  . Hypertension Mother   . Hypertension Father      ROS:  Please see the history of present illness.   Negative except fatigue  All other systems reviewed and negative.    Physical Exam:   Blood pressure 134/79, pulse 83, height 5\' 10"  (1.778 m), weight 330 lb 1.9 oz (149.741 kg). General: Well developed, morbildy obese well nourished male in no acute distress. Head: Normocephalic, atraumatic, sclera non-icteric, no xanthomas, nares are without discharge. EENT: normal Lymph Nodes:  none Back: without scoliosis/kyphosis , no CVA tendersness Neck: Negative for carotid bruits. JVD not elevated. Lungs: Clear  bilaterally to auscultation without wheezes, rales, or rhonchi. Breathing is unlabored. Heart: RRR with S1 S2. No  murmur , rubs, or gallops appreciated. Abdomen: Soft, non-tender, non-distended with normoactive bowel sounds. No hepatomegaly. No rebound/guarding. No obvious abdominal masses. Msk:  Strength and tone appear normal for age. Extremities: No clubbing or cyanosis. No edema.  Distal pedal pulses are 2+ and equal bilaterally. Skin: Warm and Dry;tatooed Neuro: Alert and oriented X 3. CN III-XII intact Grossly normal sensory and motor function . Psych:  Responds to questions appropriately with a normal affect.      Labs: Cardiac  Enzymes No results found for this basename: CKTOTAL, CKMB, TROPONINI,  in the last 72 hours CBC Lab Results  Component Value Date   WBC 10.4 03/15/2012   HGB 16.5 03/15/2012   HCT 46.3 03/15/2012   MCV 83.6 03/15/2012   PLT 210 03/15/2012   PROTIME: No results found for this basename: LABPROT, INR,  in the last 72 hours Chemistry No results found for this basename: NA, K, CL, CO2, BUN, CREATININE, CALCIUM, LABALBU, PROT, BILITOT, ALKPHOS, ALT, AST, GLUCOSE,  in the last 168 hours Lipids Lab Results  Component Value Date   CHOL 167 11/03/2011   HDL 29* 11/03/2011   LDLCALC 100* 11/03/2011   TRIG 191* 11/03/2011   BNP No results found for this basename: probnp   Miscellaneous No results found for this basename: DDIMER    Radiology/Studies:  Dg Chest 2 View  03/15/2012  *RADIOLOGY REPORT*  Clinical Data: Chest pain.  CHEST - 2 VIEW  Comparison: The PA and lateral chest 11/24/2011.  Findings: Lungs are clear.  Heart size is normal.  No pneumothorax or pleural effusion.  IMPRESSION: Negative chest.   Original Report Authenticated By: Holley Dexter, M.D.     EKG: sinus rhythm at 79 Intervals 15/08/35 Otherwise normal. Electrocardiograms were reviewed from November 13 in February 14 over which demonstrated sinus rhythm. There is some variability in RR intervals no changes and he was morphologies.  Assessment and Plan:    Sherryl Manges   hypertension

## 2012-04-06 NOTE — Assessment & Plan Note (Signed)
i am not sure what this was,  I have asked him to with a recurrence to present himself to the local firedepartment for a recording  We would be glad to see him after this

## 2012-04-06 NOTE — Patient Instructions (Addendum)
Your physician wants you to follow-up in: 1 year, You will receive a reminder letter in the mail two months in advance. If you don't receive a letter, please call our office to schedule the follow-up appointment.  Your physician recommends that you continue on your current medications as directed. Please refer to the Current Medication list given to you today. 

## 2012-04-06 NOTE — Assessment & Plan Note (Signed)
Encouraged him to lose weight

## 2012-04-13 ENCOUNTER — Other Ambulatory Visit: Payer: Self-pay | Admitting: Internal Medicine

## 2012-04-18 ENCOUNTER — Ambulatory Visit (INDEPENDENT_AMBULATORY_CARE_PROVIDER_SITE_OTHER): Payer: 59 | Admitting: Physician Assistant

## 2012-04-18 VITALS — BP 138/90 | HR 73 | Temp 97.7°F | Resp 16 | Ht 70.0 in | Wt 321.0 lb

## 2012-04-18 DIAGNOSIS — J069 Acute upper respiratory infection, unspecified: Secondary | ICD-10-CM

## 2012-04-18 MED ORDER — HYDROCODONE-HOMATROPINE 5-1.5 MG/5ML PO SYRP: 5.0000 mL | ORAL_SOLUTION | Freq: Every evening | ORAL | Status: DC | PRN

## 2012-04-18 MED ORDER — IPRATROPIUM BROMIDE 0.03 % NA SOLN: 2.0000 | Freq: Two times a day (BID) | NASAL | Status: DC

## 2012-04-18 MED ORDER — BENZONATATE 100 MG PO CAPS: 100.0000 mg | ORAL_CAPSULE | Freq: Three times a day (TID) | ORAL | Status: DC | PRN

## 2012-04-18 NOTE — Progress Notes (Signed)
  Subjective:    Patient ID: Bradley Morales, male    DOB: August 23, 1980, 32 y.o.   MRN: [id number]  HPI   Mr. Bradley Morales is a 32 yr old male here with concern for illness.  States that his daughter was diagnosed with strep 7 days ago, treated with antibiotics.  About 5-6 days ago he began feeling bad.  Started taking some amoxicillin that he had left over at his house.  Has taken this for about 5 days, does not think it is helping.  States that during the day he feels ok, but at night he coughs a lot.  And in the mornings he blows a lot of yellow congestion out of his nose.  He is concerned for infection.  Denies fever or chills.  Denies sore throat.  Denies SOB, wheezing.     Review of Systems  Constitutional: Negative for fever and chills.  HENT: Positive for ear pain, congestion and rhinorrhea. Negative for sore throat.   Respiratory: Positive for cough. Negative for shortness of breath and wheezing.   Cardiovascular: Negative.   Gastrointestinal: Negative.   Musculoskeletal: Negative.   Skin: Negative.   Neurological: Negative.        Objective:   Physical Exam  Vitals reviewed. Constitutional: He is oriented to person, place, and time. He appears well-developed and well-nourished. No distress.  HENT:  Head: Normocephalic and atraumatic.  Right Ear: Tympanic membrane and ear canal normal.  Left Ear: Tympanic membrane and ear canal normal.  Nose: Mucosal edema and rhinorrhea present. Right sinus exhibits no maxillary sinus tenderness and no frontal sinus tenderness. Left sinus exhibits no maxillary sinus tenderness and no frontal sinus tenderness.  Mouth/Throat: Uvula is midline, oropharynx is clear and moist and mucous membranes are normal.  Eyes: Conjunctivae are normal. No scleral icterus.  Neck: Neck supple.  Cardiovascular: Normal rate, regular rhythm and normal heart sounds.  Exam reveals no gallop and no friction rub.   No murmur heard. Pulmonary/Chest: Effort normal and breath sounds  normal. He has no wheezes. He has no rales.  Lymphadenopathy:    He has no cervical adenopathy.  Neurological: He is alert and oriented to person, place, and time.  Skin: Skin is warm and dry.  Psychiatric: He has a normal mood and affect. His behavior is normal.     Filed Vitals:   04/18/12 0753  BP: 138/90  Pulse: 73  Temp: 97.7 F (36.5 C)  Resp: 16        Assessment & Plan:  Acute upper respiratory infections of unspecified site - Plan: ipratropium (ATROVENT) 0.03 % nasal spray, benzonatate (TESSALON) 100 MG capsule, HYDROcodone-homatropine (HYCODAN) 5-1.5 MG/5ML syrup   Mr. Bradley Morales is a 32 yr old male here with URI.  He is afebrile, lungs CTA, no sinus tenderness.  Instructed pt to stop taking his left over amoxicillin as this will not help his viral URI.  Will start Atrovent nasal spray for congestion.  Tessalon and Hycodan for cough.  Push fluids.  Plenty of rest.  If worsening or not improving, pt will RTC.

## 2012-04-18 NOTE — Patient Instructions (Addendum)
Begin using the Atrovent nasal spray twice daily to help with nasal congestion.  Tessalon Perles for cough during the day.  Hycodan for cough at night - this will make you sleepy so only use at night.  Plenty of fluids (water is best!) and rest.   STOP taking the left over amoxicillin.  Please let us know if you are worsening or not improving.   Upper Respiratory Infection, Adult An upper respiratory infection (URI) is also sometimes known as the common cold. The upper respiratory tract includes the nose, sinuses, throat, trachea, and bronchi. Bronchi are the airways leading to the lungs. Most people improve within 1 week, but symptoms can last up to 2 weeks. A residual cough may last even longer.  CAUSES Many different viruses can infect the tissues lining the upper respiratory tract. The tissues become irritated and inflamed and often become very moist. Mucus production is also common. A cold is contagious. You can easily spread the virus to others by oral contact. This includes kissing, sharing a glass, coughing, or sneezing. Touching your mouth or nose and then touching a surface, which is then touched by another person, can also spread the virus. SYMPTOMS  Symptoms typically develop 1 to 3 days after you come in contact with a cold virus. Symptoms vary from person to person. They may include:  Runny nose.  Sneezing.  Nasal congestion.  Sinus irritation.  Sore throat.  Loss of voice (laryngitis).  Cough.  Fatigue.  Muscle aches.  Loss of appetite.  Headache.  Low-grade fever. DIAGNOSIS  You might diagnose your own cold based on familiar symptoms, since most people get a cold 2 to 3 times a year. Your caregiver can confirm this based on your exam. Most importantly, your caregiver can check that your symptoms are not due to another disease such as strep throat, sinusitis, pneumonia, asthma, or epiglottitis. Blood tests, throat tests, and X-rays are not necessary to diagnose a  common cold, but they may sometimes be helpful in excluding other more serious diseases. Your caregiver will decide if any further tests are required. RISKS AND COMPLICATIONS  You may be at risk for a more severe case of the common cold if you smoke cigarettes, have chronic heart disease (such as heart failure) or lung disease (such as asthma), or if you have a weakened immune system. The very young and very old are also at risk for more serious infections. Bacterial sinusitis, middle ear infections, and bacterial pneumonia can complicate the common cold. The common cold can worsen asthma and chronic obstructive pulmonary disease (COPD). Sometimes, these complications can require emergency medical care and may be life-threatening. PREVENTION  The best way to protect against getting a cold is to practice good hygiene. Avoid oral or hand contact with people with cold symptoms. Wash your hands often if contact occurs. There is no clear evidence that vitamin C, vitamin E, echinacea, or exercise reduces the chance of developing a cold. However, it is always recommended to get plenty of rest and practice good nutrition. TREATMENT  Treatment is directed at relieving symptoms. There is no cure. Antibiotics are not effective, because the infection is caused by a virus, not by bacteria. Treatment may include:  Increased fluid intake. Sports drinks offer valuable electrolytes, sugars, and fluids.  Breathing heated mist or steam (vaporizer or shower).  Eating chicken soup or other clear broths, and maintaining good nutrition.  Getting plenty of rest.  Using gargles or lozenges for comfort.  Controlling fevers with  ibuprofen or acetaminophen as directed by your caregiver.  Increasing usage of your inhaler if you have asthma. Zinc gel and zinc lozenges, taken in the first 24 hours of the common cold, can shorten the duration and lessen the severity of symptoms. Pain medicines may help with fever, muscle  aches, and throat pain. A variety of non-prescription medicines are available to treat congestion and runny nose. Your caregiver can make recommendations and may suggest nasal or lung inhalers for other symptoms.  HOME CARE INSTRUCTIONS   Only take over-the-counter or prescription medicines for pain, discomfort, or fever as directed by your caregiver.  Use a warm mist humidifier or inhale steam from a shower to increase air moisture. This may keep secretions moist and make it easier to breathe.  Drink enough water and fluids to keep your urine clear or pale yellow.  Rest as needed.  Return to work when your temperature has returned to normal or as your caregiver advises. You may need to stay home longer to avoid infecting others. You can also use a face mask and careful hand washing to prevent spread of the virus. SEEK MEDICAL CARE IF:   After the first few days, you feel you are getting worse rather than better.  You need your caregiver's advice about medicines to control symptoms.  You develop chills, worsening shortness of breath, or brown or red sputum. These may be signs of pneumonia.  You develop yellow or brown nasal discharge or pain in the face, especially when you bend forward. These may be signs of sinusitis.  You develop a fever, swollen neck glands, pain with swallowing, or white areas in the back of your throat. These may be signs of strep throat. SEEK IMMEDIATE MEDICAL CARE IF:   You have a fever.  You develop severe or persistent headache, ear pain, sinus pain, or chest pain.  You develop wheezing, a prolonged cough, cough up blood, or have a change in your usual mucus (if you have chronic lung disease).  You develop sore muscles or a stiff neck. Document Released: 06/30/2000 Document Revised: 03/29/2011 Document Reviewed: 05/08/2010 [facility] Patient Information 2013 [place], [place].

## 2012-05-14 ENCOUNTER — Ambulatory Visit (INDEPENDENT_AMBULATORY_CARE_PROVIDER_SITE_OTHER): Payer: 59 | Admitting: Family Medicine

## 2012-05-14 VITALS — BP 128/84 | HR 93 | Temp 98.1°F | Resp 18 | Ht 70.25 in | Wt 324.0 lb

## 2012-05-14 DIAGNOSIS — J329 Chronic sinusitis, unspecified: Secondary | ICD-10-CM

## 2012-05-14 MED ORDER — AMOXICILLIN 875 MG PO TABS: 875.0000 mg | ORAL_TABLET | Freq: Two times a day (BID) | ORAL | Status: DC

## 2012-05-14 NOTE — Patient Instructions (Signed)

## 2012-05-14 NOTE — Progress Notes (Signed)
Patient ID: [place] C Maroney MRN: [id number], DOB: Jul 24, 1980, 32 y.o. Date of Encounter: 05/14/2012, 8:03 AM  Primary Physician: Tonye Pearson, MD  Chief Complaint:  Chief Complaint  Patient presents with  . Cough    3 days  . Headache  . Sinusitis    HPI: 32 y.o. year old male presents with 3 day history of nasal congestion, post nasal drip, sore throat, sinus pressure, and cough. Afebrile. No chills. Nasal congestion thick and green/yellow. Sinus pressure is the worst symptom. Cough is productive secondary to post nasal drip and not associated with time of day. Ears feel full, leading to sensation of muffled hearing. Has tried OTC cold preps without success. No GI complaints. Appetite less with the green tea  No recent antibiotics, recent travels, or sick contacts   No leg trauma, sedentary periods, h/o cancer, or tobacco use.  Past Medical History  Diagnosis Date  . Hypertension   . Bell's palsy      Home Meds: Prior to Admission medications   Medication Sig Start Date End Date Taking? Authorizing Provider  Ascorbic Acid (VITAMIN C) 1000 MG tablet Take 1,000 mg by mouth daily.   Yes Historical Provider, MD  Brewers Yeast TABS Take 1 tablet by mouth daily.    Yes Historical Provider, MD  cholecalciferol (VITAMIN D) 1000 UNITS tablet Take 2,000 Units by mouth daily.   Yes Historical Provider, MD  cyclobenzaprine (FLEXERIL) 10 MG tablet Take 1 tablet (10 mg total) by mouth at bedtime. 03/19/12  Yes Tonye Pearson, MD  Flaxseed, Linseed, (FLAX SEED OIL PO) Take 1 tablet by mouth daily.   Yes Historical Provider, MD  Chilton Si Tea, Camillia sinensis, (GREEN TEA PO) Take 1 capsule by mouth daily.   Yes Historical Provider, MD  HYDROcodone-homatropine (HYCODAN) 5-1.5 MG/5ML syrup Take 5 mLs by mouth at bedtime as needed for cough. 04/18/12  Yes Eleanore E Egan, PA-C  ipratropium (ATROVENT) 0.03 % nasal spray Place 2 sprays into the nose 2 (two) times daily. 04/18/12  Yes Eleanore E  Egan, PA-C  lisinopril (PRINIVIL,ZESTRIL) 20 MG tablet Take 20 mg by mouth daily.   Yes Historical Provider, MD  benzonatate (TESSALON) 100 MG capsule Take 1-2 capsules (100-200 mg total) by mouth 3 (three) times daily as needed for cough. 04/18/12   Eleanore Delia Chimes, PA-C  Triamcinolone Acetonide (TRIAMCINOLONE 0.1 % CREAM : EUCERIN) CREA Apply 1 application topically 3 (three) times daily. 04/01/12   Collene Gobble, MD    Allergies: No Known Allergies  History   Social History  . Marital Status: Married    Spouse Name: N/A    Number of Children: N/A  . Years of Education: N/A   Occupational History  . 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 Smoking status: Current Every Day Smoker  . Smokeless tobacco: Not on file     Comment: uses an electronic cigarette  . Alcohol Use: No  . Drug Use: No  . Sexually Active: Yes     Comment: 1 partner in last 12 months   Other Topics Concern  . Not on file   Social History Narrative  . No narrative on file     Review of Systems:  Constitutional: negative for chills, fever, night sweats or weight changes Cardiovascular: negative for chest pain or palpitations Respiratory: negative for hemoptysis, wheezing, or shortness of breath Abdominal: negative for abdominal pain, nausea, vomiting or diarrhea Dermatological: negative for rash Neurologic: negative  for headache   Physical Exam: Blood pressure 128/84, pulse 93, temperature 98.1 F (36.7 C), temperature source Oral, resp. rate 18, height 5' 10.25" (1.784 m), weight 324 lb (146.965 kg), SpO2 99.00%., Body mass index is 46.18 kg/(m^2). General: Well developed, well nourished, in no acute distress. Head: Normocephalic, atraumatic, eyes without discharge, sclera non-icteric, nares are congested. Bilateral auditory canals clear, TM's are without perforation, pearly grey with reflective cone of light bilaterally. Serous effusion bilaterally behind TM's. Maxillary sinus  TTP. Oral cavity moist, dentition normal. Posterior pharynx with post nasal drip and mild erythema. No peritonsillar abscess or tonsillar exudate. Neck: Supple. No thyromegaly. Full ROM. No lymphadenopathy. Lungs: Clear bilaterally to auscultation without wheezes, rales, or rhonchi. Breathing is unlabored.  Heart: RRR with S1 S2. No murmurs, rubs, or gallops appreciated. Msk:  Strength and tone normal for age. Extremities: No clubbing or cyanosis. No edema. Neuro: Alert and oriented X 3. Moves all extremities spontaneously. CNII-XII grossly in tact. Psych:  Responds to questions appropriately with a normal affect.   Labs:   ASSESSMENT AND PLAN:  32 y.o. year old male with sinusitis -No diagnosis found. Amoxicillin  875 bid x 10 days.   -Tylenol/Motrin prn -Rest/fluids -RTC precautions -RTC 3-5 days if no improvement  Signed, Elvina Sidle, MD 05/14/2012 8:03 AM

## 2012-05-21 ENCOUNTER — Ambulatory Visit (INDEPENDENT_AMBULATORY_CARE_PROVIDER_SITE_OTHER): Payer: 59 | Admitting: Emergency Medicine

## 2012-05-21 VITALS — BP 127/82 | HR 82 | Temp 97.8°F | Resp 16 | Ht 70.0 in | Wt 322.0 lb

## 2012-05-21 DIAGNOSIS — N476 Balanoposthitis: Secondary | ICD-10-CM

## 2012-05-21 DIAGNOSIS — R197 Diarrhea, unspecified: Secondary | ICD-10-CM

## 2012-05-21 DIAGNOSIS — N481 Balanitis: Secondary | ICD-10-CM

## 2012-05-21 DIAGNOSIS — R51 Headache: Secondary | ICD-10-CM

## 2012-05-21 DIAGNOSIS — R1011 Right upper quadrant pain: Secondary | ICD-10-CM

## 2012-05-21 DIAGNOSIS — J309 Allergic rhinitis, unspecified: Secondary | ICD-10-CM

## 2012-05-21 LAB — CLOSTRIDIUM DIFFICILE EIA: CDIFTX: NEGATIVE

## 2012-05-21 LAB — POCT CBC
Granulocyte percent: 66 %G (ref 37–80)
HCT, POC: 47.4 % (ref 43.5–53.7)
Hemoglobin: 15.2 g/dL (ref 14.1–18.1)
MCV: 86.3 fL (ref 80–97)
Platelet Count, POC: 250 10*3/uL (ref 142–424)
RBC: 5.49 M/uL (ref 4.69–6.13)
WBC: 8.4 10*3/uL (ref 4.6–10.2)

## 2012-05-21 MED ORDER — KETOCONAZOLE 2 % EX CREA: TOPICAL_CREAM | Freq: Every day | CUTANEOUS | Status: DC

## 2012-05-21 MED ORDER — FLUTICASONE PROPIONATE 50 MCG/ACT NA SUSP: 2.0000 | Freq: Every day | NASAL | Status: DC

## 2012-05-21 NOTE — Patient Instructions (Addendum)
Diarrhea Infections caused by germs (bacterial) or a virus commonly cause diarrhea. Your caregiver has determined that with time, rest and fluids, the diarrhea should improve. In general, eat normally while drinking more water than usual. Although water may prevent dehydration, it does not contain salt and minerals (electrolytes). Broths, weak tea without caffeine and oral rehydration solutions (ORS) replace fluids and electrolytes. Small amounts of fluids should be taken frequently. Large amounts at one time may not be tolerated. Plain water may be harmful in infants and the elderly. Oral rehydrating solutions (ORS) are available at pharmacies and grocery stores. ORS replace water and important electrolytes in proper proportions. Sports drinks are not as effective as ORS and may be harmful due to sugars worsening diarrhea.  ORS is especially recommended for use in children with diarrhea. As a general guideline for children, replace any new fluid losses from diarrhea and/or vomiting with ORS as follows:  If your child weighs 22 pounds or under (10 kg or less), give 60-120 mL ( -  cup or 2 - 4 ounces) of ORS for each episode of diarrheal stool or vomiting episode.  If your child weighs more than 22 pounds (more than 10 kgs), give 120-240 mL ( - 1 cup or 4 - 8 ounces) of ORS for each diarrheal stool or episode of vomiting.  While correcting for dehydration, children should eat normally. However, foods high in sugar should be avoided because this may worsen diarrhea. Large amounts of carbonated soft drinks, juice, gelatin desserts and other highly sugared drinks should be avoided.  After correction of dehydration, other liquids that are appealing to the child may be added. Children should drink small amounts of fluids frequently and fluids should be increased as tolerated. Children should drink enough fluids to keep urine clear or pale yellow.  Adults should eat normally while drinking more fluids than  usual. Drink small amounts of fluids frequently and increase as tolerated. Drink enough fluids to keep urine clear or pale yellow. Broths, weak decaffeinated tea, lemon lime soft drinks (allowed to go flat) and ORS replace fluids and electrolytes.  Avoid:  Carbonated drinks.  Juice.  Extremely hot or cold fluids.  Caffeine drinks.  Fatty, greasy foods.  Alcohol.  Tobacco.  Too much intake of anything at one time.  Gelatin desserts.  Probiotics are active cultures of beneficial bacteria. They may lessen the amount and number of diarrheal stools in adults. Probiotics can be found in yogurt with active cultures and in supplements.  Wash hands well to avoid spreading bacteria and virus.  Anti-diarrheal medications are not recommended for infants and children.  Only take over-the-counter or prescription medicines for pain, discomfort or fever as directed by your caregiver. Do not give aspirin to children because it may cause Reye's Syndrome.  For adults, ask your caregiver if you should continue all prescribed and over-the-counter medicines.  If your caregiver has given you a follow-up appointment, it is very important to keep that appointment. Not keeping the appointment could result in a chronic or permanent injury, and disability. If there is any problem keeping the appointment, you must call back to this facility for assistance. SEEK IMMEDIATE MEDICAL CARE IF:   You or your child is unable to keep fluids down or other symptoms or problems become worse in spite of treatment.  Vomiting or diarrhea develops and becomes persistent.  There is vomiting of blood or bile (green material).  There is blood in the stool or the stools are black and   tarry.  There is no urine output in 6-8 hours or there is only a small amount of very dark urine.  Abdominal pain develops, increases or localizes.  You have a fever.  Your baby is older than 3 months with a rectal temperature of 102 F  (38.9 C) or higher.  Your baby is 3 months old or younger with a rectal temperature of 100.4 F (38 C) or higher.  You or your child develops excessive weakness, dizziness, fainting or extreme thirst.  You or your child develops a rash, stiff neck, severe headache or become irritable or sleepy and difficult to awaken. MAKE SURE YOU:   Understand these instructions.  Will watch your condition.  Will get help right away if you are not doing well or get worse. Document Released: 12/25/2001 Document Revised: 03/29/2011 Document Reviewed: 11/11/2008 [employer] Patient Information 2013 [employer].  

## 2012-05-21 NOTE — Progress Notes (Signed)
Subjective:    Patient ID: Bradley Morales, male    DOB: 01/31/1980, 32 y.o.   MRN: [id number]  HPI patient seen one week ago with allergy symptoms and sinusitis. He was placed on amoxicillin he has been taking this twice a day. He now is having 4-5 watery brown stools per day. He has some mild crampy abdominal pain but not severe. He denies any fever or chills he continues to have sinus symptoms with sinus congestion and has choking sensation in the morning when he wakes up with his CPAP machine. He is also having a flare of his balanitis which she has a history of and would like a refill on his feet, sulfa cream.    Review of Systems     Objective:   Physical Exam patient is alert and cooperative . His mucous membranes are moist. He does have some nasal congestion. His chest is clear. Heart regular rate no murmurs. The abdomen is obese there are no definite areas of tenderness and no masses are felt .  Results for orders placed in visit on 05/21/12  POCT CBC      Result Value Range   WBC 8.4  4.6 - 10.2 K/uL   Lymph, poc 2.1  0.6 - 3.4   POC LYMPH PERCENT 25.0  10 - 50 %L   MID (cbc) 0.8  0 - 0.9   POC MID % 9.0  0 - 12 %M   POC Granulocyte 5.5  2 - 6.9   Granulocyte percent 66.0  37 - 80 %G   RBC 5.49  4.69 - 6.13 M/uL   Hemoglobin 15.2  14.1 - 18.1 g/dL   HCT, POC 78.2  95.6 - 53.7 %   MCV 86.3  80 - 97 fL   MCH, POC 27.7  27 - 31.2 pg   MCHC 32.1  31.8 - 35.4 g/dL   RDW, POC 21.3     Platelet Count, POC 250  142 - 424 K/uL   MPV 10.9  0 - 99.8 fL  GLUCOSE, POCT (MANUAL RESULT ENTRY)      Result Value Range   POC Glucose 80  70 - 99 mg/dl        Assessment & Plan:   Results for orders placed in visit on 05/21/12  POCT CBC      Result Value Range   WBC 8.4  4.6 - 10.2 K/uL   Lymph, poc 2.1  0.6 - 3.4   POC LYMPH PERCENT 25.0  10 - 50 %L   MID (cbc) 0.8  0 - 0.9   POC MID % 9.0  0 - 12 %M   POC Granulocyte 5.5  2 - 6.9   Granulocyte percent 66.0  37 - 80 %G   RBC  5.49  4.69 - 6.13 M/uL   Hemoglobin 15.2  14.1 - 18.1 g/dL   HCT, POC 08.6  57.8 - 53.7 %   MCV 86.3  80 - 97 fL   MCH, POC 27.7  27 - 31.2 pg   MCHC 32.1  31.8 - 35.4 g/dL   RDW, POC 46.9     Platelet Count, POC 250  142 - 424 K/uL   MPV 10.9  0 - 99.8 fL  GLUCOSE, POCT (MANUAL RESULT ENTRY)      Result Value Range   POC Glucose 80  70 - 99 mg/dl   patient given a Brat diet. We'll check a stool for C. difficile. He was given Flonase to  help with his allergy symptoms and given a refill of his ketoconazole cream he uses for balanitis

## 2012-05-24 ENCOUNTER — Emergency Department (HOSPITAL_COMMUNITY): Payer: 59

## 2012-05-24 ENCOUNTER — Emergency Department (HOSPITAL_COMMUNITY)
Admission: EM | Admit: 2012-05-24 | Discharge: 2012-05-24 | Disposition: A | Discharge status: Home / Self Care | Payer: 59 | Attending: Emergency Medicine | Admitting: Emergency Medicine

## 2012-05-24 ENCOUNTER — Encounter (HOSPITAL_COMMUNITY): Payer: Self-pay | Admitting: Emergency Medicine

## 2012-05-24 DIAGNOSIS — M79609 Pain in unspecified limb: Secondary | ICD-10-CM | POA: Insufficient documentation

## 2012-05-24 DIAGNOSIS — I10 Essential (primary) hypertension: Secondary | ICD-10-CM | POA: Insufficient documentation

## 2012-05-24 DIAGNOSIS — Z8669 Personal history of other diseases of the nervous system and sense organs: Secondary | ICD-10-CM | POA: Insufficient documentation

## 2012-05-24 DIAGNOSIS — R079 Chest pain, unspecified: Secondary | ICD-10-CM | POA: Insufficient documentation

## 2012-05-24 DIAGNOSIS — R209 Unspecified disturbances of skin sensation: Secondary | ICD-10-CM | POA: Insufficient documentation

## 2012-05-24 DIAGNOSIS — F17200 Nicotine dependence, unspecified, uncomplicated: Secondary | ICD-10-CM | POA: Insufficient documentation

## 2012-05-24 DIAGNOSIS — IMO0002 Reserved for concepts with insufficient information to code with codable children: Secondary | ICD-10-CM | POA: Insufficient documentation

## 2012-05-24 DIAGNOSIS — Z79899 Other long term (current) drug therapy: Secondary | ICD-10-CM | POA: Insufficient documentation

## 2012-05-24 DIAGNOSIS — R197 Diarrhea, unspecified: Secondary | ICD-10-CM | POA: Insufficient documentation

## 2012-05-24 DIAGNOSIS — R51 Headache: Secondary | ICD-10-CM | POA: Insufficient documentation

## 2012-05-24 LAB — POCT I-STAT TROPONIN I

## 2012-05-24 LAB — CBC WITH DIFFERENTIAL/PLATELET
Basophils Absolute: 0 10*3/uL (ref 0.0–0.1)
HCT: 43.2 % (ref 39.0–52.0)
Hemoglobin: 15.1 g/dL (ref 13.0–17.0)
Lymphocytes Relative: 33 % (ref 12–46)
Lymphs Abs: 2.8 10*3/uL (ref 0.7–4.0)
Monocytes Absolute: 0.6 10*3/uL (ref 0.1–1.0)
Neutro Abs: 4.8 10*3/uL (ref 1.7–7.7)
RBC: 5.32 MIL/uL (ref 4.22–5.81)
RDW: 12.7 % (ref 11.5–15.5)
WBC: 8.3 10*3/uL (ref 4.0–10.5)

## 2012-05-24 LAB — COMPREHENSIVE METABOLIC PANEL
ALT: 22 U/L (ref 0–53)
AST: 19 U/L (ref 0–37)
CO2: 27 mEq/L (ref 19–32)
Chloride: 100 mEq/L (ref 96–112)
Creatinine, Ser: 1.03 mg/dL (ref 0.50–1.35)
GFR calc non Af Amer: >90 mL/min (ref >90)
Glucose, Bld: 121 mg/dL — ABNORMAL HIGH (ref 70–99)
Sodium: 136 mEq/L (ref 135–145)
Total Bilirubin: 0.6 mg/dL (ref 0.3–1.2)

## 2012-05-24 MED ORDER — DIPHENHYDRAMINE HCL 50 MG/ML IJ SOLN
12.5000 mg | Freq: Once | INTRAMUSCULAR | Status: AC
  Administered 2012-05-24: 12.5 mg via INTRAVENOUS
  Filled 2012-05-24: qty 1

## 2012-05-24 MED ORDER — METOCLOPRAMIDE HCL 5 MG/ML IJ SOLN
5.0000 mg | Freq: Once | INTRAMUSCULAR | Status: AC
  Administered 2012-05-24: 5 mg via INTRAVENOUS
  Filled 2012-05-24: qty 2

## 2012-05-24 MED ORDER — SODIUM CHLORIDE 0.9 % IV BOLUS (SEPSIS)
500.0000 mL | Freq: Once | INTRAVENOUS | Status: AC
  Administered 2012-05-24: 500 mL via INTRAVENOUS

## 2012-05-24 NOTE — ED Provider Notes (Signed)
Medical screening examination/treatment/procedure(s) were performed by non-physician practitioner and as supervising physician I was immediately available for consultation/collaboration.  Jones Skene, M.D.     Jones Skene, MD 05/24/12 [phone]

## 2012-05-24 NOTE — ED Notes (Signed)
Pt. States he went to see he PMD recently for c/o nasal congestion and was diagnosed with sinusitis and placed on Augmentin. He took it for 7/10 days and was having diarrhea and his PMD took him off of it and placed him on a nasal spray only. States the diarrhea stopped.

## 2012-05-24 NOTE — ED Notes (Signed)
PT. REPORTS DIARRHEA , HEADACHE AND BILATERAL LEG PAIN FOR SEVERAL DAYS , DENIES INJURY , NO FEVER OR CHILLS. AMBULATORY .

## 2012-05-24 NOTE — ED Notes (Signed)
Patient is resting comfortably. Family at bedside.  

## 2012-05-24 NOTE — ED Provider Notes (Addendum)
History     CSN: [id number]  Arrival date & time 05/24/12  0248   First MD Initiated Contact with Patient 05/24/12 [phone]      Chief Complaint  Patient presents with  . Diarrhea  . Headache  . Leg Pain    (Consider location/radiation/quality/duration/timing/severity/associated sxs/prior treatment) HPI Comments: Patient states, that he was treated by his primary care physician for sinusitis with Augmentin, which gave him diarrhea.  He, subsequently stopped taking the meds in the diarrhea has stopped, but he has noticed, that for the past 3, days.  He has had a left sided headache.  He has a daily morning cough and congestion.  He recently stopped smoking for cough and congestion.  Has gotten worse since that time.  He also has left arm tingling, and point tenderness in his left chest for the last 12 hours, not associated with nausea, or vomiting, or diaphoresis.  He also reports, that he's had 3-4, days of bilateral leg discomfort.  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  She's had laser correction surgery on one, leg.  He's not had laser correction surgery on the other leg on a daily basis while at work.  He wears a compression stocking on the leg.  It is not have surgery on the weekends.  He does not wear his compression stocking.  Does not report an increase in edema to his lower extremities.  No shortness of breath.  He was seen by her cardiologist, without a definitive diagnosis for his ongoing chest pain, that he has been experiencing for the past year.  His cardiologist told him that the next time.  He had this discomfort to brush to the emergency department, and get an EKG so that they can see if there is any change in his rhythm.  He has not had a stress test and echocardiogram are other definitive cardiac workup.  He, states he thinks his physician.  Has drawn, TSH, but is unsure  Patient is a 32 y.o. male presenting with diarrhea, headaches, and leg pain. The history is provided by the  patient.  Diarrhea Progression:  Improving Associated symptoms: headaches   Headache Associated symptoms: diarrhea   Leg Pain   Past Medical History  Diagnosis Date  . Hypertension   . Bell's palsy     Past Surgical History  Procedure Laterality Date  . Vasectomy      Family History  Problem Relation Age of Onset  . Hypertension Mother   . Hypertension Father     History  Substance Use Topics  . Smoking status: Current Every Day Smoker  . Smokeless tobacco: Not on file     Comment: uses an electronic cigarette  . Alcohol Use: No      Review of Systems  Gastrointestinal: Positive for diarrhea.  Neurological: Positive for headaches.  All other systems reviewed and are negative.    Allergies  Review of patient's allergies indicates no known allergies.  Home Medications   Current Outpatient Rx  Name  Route  Sig  Dispense  Refill  . Ascorbic Acid (VITAMIN C) 1000 MG tablet   Oral   Take 1,000 mg by mouth daily.         Janene Madeira Yeast TABS   Oral   Take 1 tablet by mouth daily.          . cholecalciferol (VITAMIN D) 1000 UNITS tablet   Oral   Take 2,000 Units by mouth daily.         Marland Kitchen  Flaxseed, Linseed, (FLAX SEED OIL PO)   Oral   Take 1 tablet by mouth daily.         . fluticasone (FLONASE) 50 MCG/ACT nasal spray   Nasal   Place 2 sprays into the nose daily.   16 g   6   . Green Tea, Camillia sinensis, (GREEN TEA PO)   Oral   Take 1 capsule by mouth daily.         Marland Kitchen ketoconazole (NIZORAL) 2 % cream   Topical   Apply topically daily.   30 g   0   . lisinopril (PRINIVIL,ZESTRIL) 10 MG tablet   Oral   Take 10 mg by mouth daily.         . butalbital-acetaminophen-caffeine (FIORICET, ESGIC) 50-325-40 MG per tablet   Oral   Take 1 tablet by mouth every 6 (six) hours as needed for headache.   20 tablet   0   . Eszopiclone 3 MG TABS   Oral   Take 1 tablet (3 mg total) by mouth at bedtime. Take immediately before bedtime    30 tablet   2     BP 122/62  Pulse 65  Temp(Src) 97.7 F (36.5 C) (Oral)  Resp 15  SpO2 98%  Physical Exam  Nursing note and vitals reviewed. Constitutional: He appears well-developed and well-nourished.  HENT:  Head: Normocephalic.  Neck: Normal range of motion.  Cardiovascular: Normal rate.   Pulmonary/Chest: Effort normal.  Musculoskeletal: Normal range of motion. He exhibits no edema and no tenderness.  Neurological: He is alert.  Skin: Skin is warm and dry. No rash noted. No erythema.    ED Course  Procedures (including critical care time)  Labs Reviewed  COMPREHENSIVE METABOLIC PANEL - Abnormal; Notable for the following:    Glucose, Bld 121 (*)    All other components within normal limits  CBC WITH DIFFERENTIAL  TSH  T4, FREE  POCT I-STAT TROPONIN I   No results found.   1. Headache   2. Chest pain       MDM   Signed out to oncoming shift for disposition        Arman Filter, NP 05/26/12 2140  Arman Filter, NP 06/05/12 2005  Arman Filter, NP 06/09/12 1610

## 2012-05-24 NOTE — ED Notes (Signed)
Patient is alert and orientedx4.  Patient was explained discharge instructions and they understood them with no questions.  The patient's wife, Oddie Bottger, is taking the patient home.

## 2012-05-24 NOTE — ED Provider Notes (Signed)
6:14 AM Patient signed out to me by Earley Favor, NP. Patient will be discharged after receiving medications and IV fluids.   8:04 AM Patient feeling better after IV fluids and medication. Patient instructed to follow up with PCP and return to the ED with worsening or concerning symptoms. Patient afebrile with stable vitals.    Date: 05/24/2012  Rate: 66  Rhythm: normal sinus rhythm  QRS Axis: normal  Intervals: normal  ST/T Wave abnormalities: normal  Conduction Disutrbances:none  Narrative Interpretation: NSR unchanged from previous  Old EKG Reviewed: unchanged    Emilia Beck, PA-C 05/24/12 [phone]

## 2012-05-29 ENCOUNTER — Ambulatory Visit (INDEPENDENT_AMBULATORY_CARE_PROVIDER_SITE_OTHER): Payer: 59 | Admitting: Internal Medicine

## 2012-05-29 VITALS — BP 148/94 | HR 70 | Temp 98.4°F | Resp 18 | Ht 71.0 in | Wt 325.0 lb

## 2012-05-29 DIAGNOSIS — R51 Headache: Secondary | ICD-10-CM

## 2012-05-29 DIAGNOSIS — G4700 Insomnia, unspecified: Secondary | ICD-10-CM

## 2012-05-29 DIAGNOSIS — I10 Essential (primary) hypertension: Secondary | ICD-10-CM

## 2012-05-29 MED ORDER — BUTALBITAL-APAP-CAFFEINE 50-325-40 MG PO TABS: 1.0000 | ORAL_TABLET | Freq: Four times a day (QID) | ORAL | Status: DC | PRN

## 2012-05-29 MED ORDER — ESZOPICLONE 3 MG PO TABS: 3.0000 mg | ORAL_TABLET | Freq: Every day | ORAL | Status: DC

## 2012-05-29 NOTE — Progress Notes (Signed)
  Subjective:    Patient ID: Bradley Morales, male    DOB: [birth year], 32 y.o.   MRN: [id number]  HPI  Bradley Morales to er for heart racing/dizzy-again. He was given reglan and benadryl and that helped his head. CT head,CXR and EKG were normal--BP was 90/60 at one point---see hx recent dx HTN but erratic med response-dose recently lowered. Brings in home BPs off meds and all readings (12) are normal off meds.  dx w/ AR-Patient did not get nasal spray cause one Side eff was headaches.  He is having a bad headache today.  No drainage but is sneezing.    He has headache for more than a week. Wakes up with it. Bifrontal. No N,V, Vision chg, photophobia.   He cannot sleep for 1 month or more. Has known sleep apnea.  He tosses and turns constantly and pulls mask off.( Trying to use the cpap).  Has taken lunesta before but Remus Loffler does not work for him.     Was here two weeks ago and seen by Dr. Cleta Alberts.  Took him off of antibiotic Augmentin because of stomach issues.   Stomach has him straining in the am and then bowel movements are ok rest of day.   Legs have been hurting him.  He complains about pain after work.  Hurts in big muscles going into calves.  Some slight swelling. See hx varicosities.   Review of Systems No fever No purulent d/c No cough No further chest pain or palpitations Appetite OK     Objective:   Physical Exam BP 148/94  Pulse 70  Temp(Src) 98.4 F (36.9 C) (Oral)  Resp 18  Ht 5\' 11"  (1.803 m)  Wt 325 lb (147.419 kg)  BMI 45.35 kg/m2  SpO2 99% HEENT clear Heart regular Neuro intact       Assessment & Plan:  P#1 Insomnia/sleep apnea/mask problems--HAs due to sleep deprivation  lunesta  Use mask  esgic prn at work  Reck 3-4 weeks P#2 prolonged GI upset s/p antibios  Add probiotics P#3 HTN unlikely  -stay off meds/follow home BPs P#4 leg pain -unclear etio-follow  Needs to work on weight and smoking as before Meds ordered this encounter  Medications  . Eszopiclone 3  MG TABS    Sig: Take 1 tablet (3 mg total) by mouth at bedtime. Take immediately before bedtime    Dispense:  30 tablet    Refill:  2  . butalbital-acetaminophen-caffeine (FIORICET, ESGIC) 50-325-40 MG per tablet    Sig: Take 1 tablet by mouth every 6 (six) hours as needed for headache.    Dispense:  20 tablet    Refill:  0

## 2012-05-30 ENCOUNTER — Encounter: Payer: Self-pay | Admitting: Internal Medicine

## 2012-05-30 NOTE — ED Provider Notes (Signed)
Medical screening examination/treatment/procedure(s) were performed by non-physician practitioner and as supervising physician I was immediately available for consultation/collaboration.  Jones Skene, M.D.    Jones Skene, MD 05/30/12 1516

## 2012-06-06 NOTE — ED Provider Notes (Signed)
Medical screening examination/treatment/procedure(s) were performed by non-physician practitioner and as supervising physician I was immediately available for consultation/collaboration.  Jones Skene, M.D.     Jones Skene, MD 06/06/12 [phone]

## 2012-06-19 NOTE — ED Provider Notes (Signed)
Medical screening examination/treatment/procedure(s) were performed by non-physician practitioner and as supervising physician I was immediately available for consultation/collaboration.  Jones Skene, M.D.     Jones Skene, MD 06/19/12 1610

## 2012-07-11 ENCOUNTER — Ambulatory Visit (INDEPENDENT_AMBULATORY_CARE_PROVIDER_SITE_OTHER): Payer: 59 | Admitting: Internal Medicine

## 2012-07-11 VITALS — BP 152/98 | HR 72 | Temp 98.1°F | Resp 16 | Ht 70.0 in | Wt 316.0 lb

## 2012-07-11 DIAGNOSIS — R7309 Other abnormal glucose: Secondary | ICD-10-CM

## 2012-07-11 DIAGNOSIS — M25569 Pain in unspecified knee: Secondary | ICD-10-CM

## 2012-07-11 DIAGNOSIS — R739 Hyperglycemia, unspecified: Secondary | ICD-10-CM

## 2012-07-11 DIAGNOSIS — I10 Essential (primary) hypertension: Secondary | ICD-10-CM

## 2012-07-11 DIAGNOSIS — R3915 Urgency of urination: Secondary | ICD-10-CM

## 2012-07-11 DIAGNOSIS — M25561 Pain in right knee: Secondary | ICD-10-CM

## 2012-07-11 LAB — POCT CBC
Lymph, poc: 2.4 (ref 0.6–3.4)
MCH, POC: 29.1 pg (ref 27–31.2)
MCV: 88.9 fL (ref 80–97)
MID (cbc): 0.6 (ref 0–0.9)
POC LYMPH PERCENT: 28.4 %L (ref 10–50)
Platelet Count, POC: 214 10*3/uL (ref 142–424)
RBC: 5.15 M/uL (ref 4.69–6.13)
WBC: 8.3 10*3/uL (ref 4.6–10.2)

## 2012-07-11 LAB — POCT URINALYSIS DIPSTICK
Ketones, UA: NEGATIVE
Leukocytes, UA: NEGATIVE
Nitrite, UA: NEGATIVE
Protein, UA: NEGATIVE
Urobilinogen, UA: 0.2

## 2012-07-11 LAB — POCT UA - MICROSCOPIC ONLY: Yeast, UA: NEGATIVE

## 2012-07-11 LAB — COMPREHENSIVE METABOLIC PANEL
Albumin: 4.5 g/dL (ref 3.5–5.2)
CO2: 23 mEq/L (ref 19–32)
Glucose, Bld: 82 mg/dL (ref 70–99)
Sodium: 141 mEq/L (ref 135–145)
Total Bilirubin: 0.8 mg/dL (ref 0.3–1.2)
Total Protein: 7 g/dL (ref 6.0–8.3)

## 2012-07-11 MED ORDER — LISINOPRIL 5 MG PO TABS: 5.0000 mg | ORAL_TABLET | Freq: Every day | ORAL | Status: DC

## 2012-07-11 NOTE — Patient Instructions (Addendum)
For your gastrointestinal tract start taking a probiotic called Align We will call your appointment for the ultrasound

## 2012-07-11 NOTE — Progress Notes (Signed)
Subjective:    Patient ID: Bradley Morales, male    DOB: 02/26/1980, 32 y.o.   MRN: [id number]  HPIc/o bruise behind R knee for 6 weeks without injury//in the last 2 weeks has had cramping in the calf and thigh both day and night History of venous insufficiency with varicosities on the left cared for by Dr. Ardyth Gal 2 yr ago No chest pain cough or palpitations or shortness of breath  HAs continued but are better not requiring medicine at the current time GI-since the episode of diarrhea for 5 weeks ago he has continued to have the feeling that he has to have a BM but without much results/no constipation/stool still soft/no blood  At last office visit blood sugar was 121  He has had episodes about every 2 months of urinary urgency with small or no void for about 30 or 40 minutes occurring mainly at work one time then disappearing  Has noticed a gradual drift of blood pressure back to high levels since medicine was discontinued 3 months ago/now 145/90 at night home though still normal in the morning///remember that blood pressure medicine was discontinued when he was having syncope, dizziness with blood pressure of 90-100 systolic  Review of Systems No fever chills or night sweats 10 pound weight loss due to heat Still has poor adaptation to CPAP started 3-4 months ago (advised to call home health) Waking frequently     Objective:   Physical Exam BP 152/98  Pulse 72  Temp(Src) 98.1 F (36.7 C) (Oral)  Resp 16  Ht 5\' 10"  (1.778 m)  Wt 316 lb (143.337 kg)  BMI 45.34 kg/m2  SpO2 99% HEENT clear Heart regular Lungs clear Right lower extremity is not swollen and has no edema but there is a 2 cm ecchymoses in the right popliteal area and he is tender deep in the calf and deep in the posterior thigh without demonstrable cord or mass and with a negative Homans Good peripheral pulses       Results for orders placed in visit on 07/11/12  POCT CBC      Result Value Range   WBC 8.3   4.6 - 10.2 K/uL   Lymph, poc 2.4  0.6 - 3.4   POC LYMPH PERCENT 28.4  10 - 50 %L   MID (cbc) 0.6  0 - 0.9   POC MID % 7.8  0 - 12 %M   POC Granulocyte 5.3  2 - 6.9   Granulocyte percent 63.8  37 - 80 %G   RBC 5.15  4.69 - 6.13 M/uL   Hemoglobin 15.0  14.1 - 18.1 g/dL   HCT, POC 78.2  95.6 - 53.7 %   MCV 88.9  80 - 97 fL   MCH, POC 29.1  27 - 31.2 pg   MCHC 32.8  31.8 - 35.4 g/dL   RDW, POC 21.3     Platelet Count, POC 214  142 - 424 K/uL   MPV 10.0  0 - 99.8 fL  GLUCOSE, POCT (MANUAL RESULT ENTRY)      Result Value Range   POC Glucose 81  70 - 99 mg/dl  POCT GLYCOSYLATED HEMOGLOBIN (HGB A1C)      Result Value Range   Hemoglobin A1C 4.8    POCT URINALYSIS DIPSTICK      Result Value Range   Color, UA yellow     Clarity, UA clear     Glucose, UA neg     Bilirubin, UA neg  Ketones, UA neg     Spec Grav, UA 1.020     Blood, UA neg     pH, UA 6.5     Protein, UA neg     Urobilinogen, UA 0.2     Nitrite, UA neg     Leukocytes, UA Negative    POCT UA - MICROSCOPIC ONLY      Result Value Range   WBC, Ur, HPF, POC 0-1     RBC, urine, microscopic 0-2     Bacteria, U Microscopic neg     Mucus, UA neg     Epithelial cells, urine per micros 0-1     Crystals, Ur, HPF, POC neg     Casts, Ur, LPF, POC neg     Yeast, UA neg      Assessment & Plan:  Morbid obesity--needs to continue this weight loss  Urgency of urination - reassured with normal lab  Pain in joint, lower leg, right - Plan: US Venous Img Lower Unilateral Right,-hopefully at Dr. Ranelle Oyster office where he had this done before  Hyperglycemia -with studies revealing normal fasting glucose and normal hemoglobin A1c at this visit  HTN (hypertension) -restart lisinopril at 5 mg and follow home blood pressures  Obstructive sleep apnea with poor response to CPAP--- to have home health [facility] troubleshoot  Post diarrhea symptoms of stool discomfort-will try probiotics before further workup  Metabolic profile  pending

## 2012-07-12 NOTE — Progress Notes (Signed)
Left msg for pt to schedule 6-7 week f/up with Dr. Merla Riches.

## 2012-07-13 ENCOUNTER — Telehealth: Payer: Self-pay | Admitting: Radiology

## 2012-07-13 ENCOUNTER — Telehealth: Payer: Self-pay

## 2012-07-13 NOTE — Telephone Encounter (Signed)
Please get this report to me when it arrives.

## 2012-07-13 NOTE — Telephone Encounter (Signed)
Carollee Herter has called from [facility] and was wanting to let Dr. Merla Riches know that they are about to fax over a report from Jorah's visit. Call back number if needed is [phone]

## 2012-07-13 NOTE — Telephone Encounter (Signed)
Patient has no DVT or superficial clots. He is advised. 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 Left message for him to advise no clots, do you have anything further to recommend?

## 2012-07-13 NOTE — Telephone Encounter (Signed)
At your desk on top of your keyboard at the nurses station.

## 2012-07-13 NOTE — Telephone Encounter (Signed)
You can call me or text me w/ results if not normal

## 2012-07-14 NOTE — Telephone Encounter (Signed)
He has 3 other things to focus on HTN (hypertension) -restart lisinopril at 5 mg and follow home blood pressures  Obstructive sleep apnea with poor response to CPAP--- to have home health [facility] troubleshoot  Post diarrhea symptoms of stool discomfort-will try probiotics before further workup

## 2012-07-14 NOTE — Telephone Encounter (Signed)
Pt CB and I gave him instr's from Dr Merla Riches. Pt was aware of these plans and is in agreement. He has scheduled a 6 week f/up.

## 2012-07-14 NOTE — Telephone Encounter (Signed)
Called pt left mssg for him to call me back.

## 2012-07-19 ENCOUNTER — Ambulatory Visit (INDEPENDENT_AMBULATORY_CARE_PROVIDER_SITE_OTHER): Payer: 59 | Admitting: Emergency Medicine

## 2012-07-19 ENCOUNTER — Telehealth: Payer: Self-pay | Admitting: Family Medicine

## 2012-07-19 VITALS — BP 126/80 | HR 76 | Temp 98.1°F | Resp 16 | Ht 70.0 in | Wt 320.0 lb

## 2012-07-19 DIAGNOSIS — S335XXA Sprain of ligaments of lumbar spine, initial encounter: Secondary | ICD-10-CM

## 2012-07-19 DIAGNOSIS — J029 Acute pharyngitis, unspecified: Secondary | ICD-10-CM

## 2012-07-19 MED ORDER — NAPROXEN SODIUM 550 MG PO TABS: 550.0000 mg | ORAL_TABLET | Freq: Two times a day (BID) | ORAL | Status: DC

## 2012-07-19 MED ORDER — CYCLOBENZAPRINE HCL 10 MG PO TABS: 10.0000 mg | ORAL_TABLET | Freq: Three times a day (TID) | ORAL | Status: DC | PRN

## 2012-07-19 MED ORDER — HYDROCODONE-ACETAMINOPHEN 5-325 MG PO TABS: 1.0000 | ORAL_TABLET | ORAL | Status: DC | PRN

## 2012-07-19 NOTE — Progress Notes (Signed)
[facility] [address], [place] [address] [phone]  Date:  07/19/2012   Name:  Bradley Morales   DOB:  1980/06/16   MRN:  [id number]  PCP:  Tonye Pearson, MD    Chief Complaint: Back Pain   History of Present Illness:  Bradley C Dehner is a 32 y.o. very pleasant male patient who presents with the following:  Pain in low back into left leg since Friday.  Says that he awakens in the morning and the leg is asleep.  Thinks he may have hurt his back fashioning a pig cooker out of a 275 gallon fuel tank and fitting it to a trailer.  No improvement with over the counter medications or other home remedies. No weakness in leg.  Says pain in leg worse with supine posture and not sitting or standing.  Denies other complaint or health concern today.   Patient Active Problem List   Diagnosis Date Noted  . Palpitations 04/06/2012  . OSA (obstructive sleep apnea) 03/06/2012  . Nicotine addiction 11/05/2011  . BMI 45.0-49.9, adult 11/05/2011    Past Medical History  Diagnosis Date  . Hypertension   . Bell's palsy     Past Surgical History  Procedure Laterality Date  . Vasectomy      History  Substance Use Topics  . Smoking status: Current Every Day Smoker  . Smokeless tobacco: Not on file     Comment: uses an electronic cigarette  . Alcohol Use: No    Family History  Problem Relation Age of Onset  . Hypertension Mother   . Hypertension Father     No Known Allergies  Medication list has been reviewed and updated.  Current Outpatient Prescriptions on File Prior to Visit  Medication Sig Dispense Refill  . Ascorbic Acid (VITAMIN C) 1000 MG tablet Take 1,000 mg by mouth daily.      Janene Madeira Yeast TABS Take 1 tablet by mouth daily.       . cholecalciferol (VITAMIN D) 1000 UNITS tablet Take 2,000 Units by mouth daily.      . Eszopiclone 3 MG TABS Take 1 tablet (3 mg total) by mouth at bedtime. Take immediately before bedtime  30 tablet  2  .  Flaxseed, Linseed, (FLAX SEED OIL PO) Take 1 tablet by mouth daily.      Chilton Si Tea, Camillia sinensis, (GREEN TEA PO) Take 1 capsule by mouth daily.      Marland Kitchen lisinopril (PRINIVIL,ZESTRIL) 5 MG tablet Take 1 tablet (5 mg total) by mouth daily.  90 tablet  3  . butalbital-acetaminophen-caffeine (FIORICET, ESGIC) 50-325-40 MG per tablet Take 1 tablet by mouth every 6 (six) hours as needed for headache.  20 tablet  0  . fluticasone (FLONASE) 50 MCG/ACT nasal spray Place 2 sprays into the nose daily.  16 g  6  . ketoconazole (NIZORAL) 2 % cream Apply topically daily.  30 g  0   No current facility-administered medications on file prior to visit.    Review of Systems:  As per HPI, otherwise negative.    Physical Examination: Filed Vitals:   07/19/12 1608  BP: 126/80  Pulse: 76  Temp: 98.1 F (36.7 C)  Resp: 16   Filed Vitals:   07/19/12 1608  Height: 5\' 10"  (1.778 m)  Weight: 320 lb (145.151 kg)   Body mass index is 45.92 kg/(m^2). Ideal Body Weight: Weight in (lb) to have BMI = 25: 173.9  GEN: WDWN, NAD, Non-toxic, Alert & Oriented x 3 HEENT: Atraumatic, Normocephalic.  Ears and Nose: No external deformity. EXTR: No clubbing/cyanosis/edema NEURO: Normal gait.  PSYCH: Normally interactive. Conversant. Not depressed or anxious appearing.  Calm demeanor.  LEFT lower back tender sacral area.  Neuro intact   Assessment and Plan: Lumbar strain Anaprox Flexeril Hydrocodone  Signed,  Phillips Odor, MD

## 2012-07-19 NOTE — Telephone Encounter (Signed)
Message copied by PETTY, Stann Mainland on Wed Jul 19, 2012  1:52 PM ------      Message from: Tonye Pearson      Created: Tue Jul 11, 2012  9:06 PM       Needs f/u appt at 104 for HTN in 6-7 weeks ------

## 2012-07-19 NOTE — Patient Instructions (Addendum)

## 2012-07-19 NOTE — Telephone Encounter (Signed)
Message copied by PETTY, Stann Mainland on Wed Jul 19, 2012  2:41 PM ------      Message from: Tonye Pearson      Created: Tue Jul 11, 2012  9:06 PM       Needs f/u appt at 104 for HTN in 6-7 weeks ------

## 2012-07-19 NOTE — Telephone Encounter (Signed)
Pt has an appt. In Aug. with Dr. Merla Riches

## 2012-07-19 NOTE — Telephone Encounter (Signed)
LM to CB

## 2012-07-23 ENCOUNTER — Ambulatory Visit: Payer: 59

## 2012-07-23 ENCOUNTER — Ambulatory Visit (INDEPENDENT_AMBULATORY_CARE_PROVIDER_SITE_OTHER): Payer: 59 | Admitting: Family Medicine

## 2012-07-23 VITALS — BP 136/80 | HR 84 | Temp 98.2°F | Resp 16 | Ht 70.0 in | Wt 319.0 lb

## 2012-07-23 DIAGNOSIS — M25529 Pain in unspecified elbow: Secondary | ICD-10-CM

## 2012-07-23 DIAGNOSIS — M25522 Pain in left elbow: Secondary | ICD-10-CM

## 2012-07-23 DIAGNOSIS — K5900 Constipation, unspecified: Secondary | ICD-10-CM

## 2012-07-23 DIAGNOSIS — R202 Paresthesia of skin: Secondary | ICD-10-CM

## 2012-07-23 DIAGNOSIS — R209 Unspecified disturbances of skin sensation: Secondary | ICD-10-CM

## 2012-07-23 MED ORDER — MELOXICAM 7.5 MG PO TABS: ORAL_TABLET | ORAL | Status: DC

## 2012-07-23 NOTE — Patient Instructions (Addendum)
Ulnar Nerve Contusion with Rehab The ulnar nerve lies near the surface of the skin as it passes by the elbow. This location causes it to be susceptible to injury. An ulnar nerve contusion is a bruise of the nerve. It is the result of direct trauma to the elbow. Ulnar nerve contusions are characterized by pain, weakness, and loss of feeling in the hand. SYMPTOMS   Signs of nerve damage include: tingling, numbness, weakness, and/or loss of feeling in the hand, specifically the little finger and ring finger.  Sharp pains that may shoot from the elbow to the wrist and hand.  Decreased hand function.  Tenderness and/ or inflammation in the elbow.  Muscle wasting (atrophy) in the hand. CAUSES  Ulnar nerve contusions are caused by direct trauma to the elbow that results in bleeding which enters the nerve. RISK INCREASES WITH:  Contact sports (football, soccer, or rugby).  Bleeding disorders.  Taking blood thinning medicine (warfarin [Coumadin], aspirin, or nonsteroidal anti-inflammatory medications).  Diabetes mellitus.  Underactive thyroid gland (hypothyroidism). PREVENTION  Wear properly fitted and padded protective equipment.  Only take blood thinning medication when necessary. PROGNOSIS  Ulnar nerve contusions usually heal within 6 weeks. Healing often occurs spontaneously, but treatment helps reduce symptoms.  RELATED COMPLICATIONS   Permanent nerve damage, including pain, numbness, tingling, or weakness in the hand (rare).  Weak grip.  Prolonged healing time, if improperly treated or re-injured. TREATMENT  Treatment initially involves resting from any activities that aggravate the symptoms, and the use of ice and medications to help reduce pain and inflammation. The use of strengthening and stretching exercises may help reduce pain with activity. These exercises may be performed at home or with referral to a therapist. Your caregiver may recommend that you splint the elbow at  night to help healing of the nerve. If symptoms persist despite conservative (non-surgical) treatment, then surgery may be recommended to free the nerve. MEDICATION   If pain medication is necessary, then nonsteroidal anti-inflammatory medications, such as aspirin and ibuprofen, or other minor pain relievers, such as acetaminophen, are often recommended.  Do not take pain medication within 7 days before surgery.  Prescription pain relievers may be given if deemed necessary by your caregiver. Use only as directed and only as much as you need. COLD THERAPY  Cold treatment (icing) relieves pain and reduces inflammation. Cold treatment should be applied for 10 to 15 minutes every 2 to 3 hours for inflammation and pain and immediately after any activity that aggravates your symptoms. Use ice packs or massage the area with a piece of ice (ice massage). SEEK MEDICAL CARE IF:   Treatment seems to offer no benefit, or the condition worsens.  Any medications produce adverse side effects. EXERCISES RANGE OF MOTION (ROM) AND STRETCHING EXERCISES - Ulnar Nerve Contusion These exercises may help you when beginning to rehabilitate your injury. Do not begin these exercises until your physician, physical therapist or athletic trainer advises you to do so. Discontinue any exercise that worsens your symptoms. Contact your physician with instructions on how to continue. Your symptoms may resolve with or without further involvement from your physician, physical therapist or athletic trainer. While completing these exercises, remember:  Restoring tissue flexibility helps normal motion to return to the joints. This allows healthier, less painful movement and activity.  An effective stretch should be held for at least 30 seconds.  A stretch should never be painful. You should only feel a gentle lengthening or release in the stretched tissue. RANGE   OF MOTION  Extension  Hold your right / left arm at your side and  straighten your elbow as far as you can using your right / left arm muscles.  Straighten the right / left elbow farther by gently pushing down on your forearm until you feel a gentle stretch on the inside of your elbow. Hold this position for __________ seconds.  Slowly return to the starting position. Repeat __________ times. Complete this exercise __________ times per day.  RANGE OF MOTION  Flexion  Hold your right / left arm at your side and bend your elbow as far as you can using your right / left arm muscles.  Bend the right / left elbow farther by gently pushing up on your forearm until you feel a gentle stretch on the outside of your elbow. Hold this position for __________ seconds.  Slowly return to the starting position. Repeat __________ times. Complete this exercise __________ times per day.  RANGE OF MOTION  Wrist Flexion, Active-Assisted  Extend your right / left elbow with your fingers pointing down.*  Gently pull the back of your hand towards you until you feel a gentle stretch on the top of your forearm.  Hold this position for __________ seconds. Repeat __________ times. Complete this exercise __________ times per day.  *If directed by your physician, physical therapist or athletic trainer, complete this stretch with your elbow bent rather than extended. RANGE OF MOTION  Wrist Extension, Active-Assisted  Extend your right / left elbow and turn your palm upwards.*  Gently pull your palm/fingertips back so your wrist extends and your fingers point more toward the ground.  You should feel a gentle stretch on the inside of your forearm.  Hold this position for __________ seconds. Repeat __________ times. Complete this exercise __________ times per day. *If directed by your physician, physical therapist or athletic trainer, complete this stretch with your elbow bent, rather than extended. RANGE OF MOTION  Supination, Active  Stand or sit with your elbows at your side.  Bend your right / left elbow to 90 degrees.  Turn your palm upward until you feel a gentle stretch on the inside of your forearm.  Hold this position for __________ seconds. Slowly release and return to the starting position. Repeat __________ times. Complete this stretch __________ times per day.  RANGE OF MOTION  Pronation, Active  Stand or sit with your elbows at your side. Bend your right / left elbow to 90 degrees.  Turn your palm downward until you feel a gentle stretch on the top of your forearm.  Hold this position for __________ seconds. Slowly release and return to the starting position. Repeat __________ times. Complete this stretch __________ times per day.  STRETCH - Wrist Flexion   Place the back of your right / left hand on a tabletop leaving your elbow slightly bent. Your fingers should point away from your body.  Gently press the back of your hand down onto the table by straightening your elbow. You should feel a stretch on the top of your forearm.  Hold this position for __________ seconds. Repeat __________ times. Complete this stretch __________ times per day.  STRETCH  Wrist Extension   Place your right / left fingertips on a tabletop leaving your elbow slightly bent. Your fingers should point backwards.  Gently press your fingers and palm down onto the table by straightening your elbow. You should feel a stretch on the inside of your forearm.  Hold this position for __________   seconds. Repeat __________ times. Complete this stretch __________ times per day.  STRENGTHENING EXERCISES - Ulnar Nerve Contusion These exercises may help you when beginning to rehabilitate your injury. Do not begin these exercises until your physician, physical therapist or athletic trainer advises you to do so. Discontinue any exercise that worsens your symptoms. Contact your physician for instructions on how to continue. They may resolve your symptoms with or without further involvement  from your physician, physical therapist or athletic trainer. While completing these exercises, remember:   Muscles can gain both the endurance and the strength needed for everyday activities through controlled exercises.  Complete these exercises as instructed by your physician, physical therapist or athletic trainer. Progress with the resistance and repetition exercises only as your caregiver advises. STRENGTH Wrist Flexors  Sit with your right / left forearm palm-up and fully supported. Your elbow should be resting below the height of your shoulder. Allow your wrist to extend over the edge of the surface.  Loosely holding a __________ weight or a piece of rubber exercise band/tubing, slowly curl your hand up toward your forearm.  Hold this position for __________ seconds. Slowly lower the wrist back to the starting position in a controlled manner. Repeat __________ times. Complete this exercise __________ times per day.  STRENGTH  Wrist Extensors  Sit with your right / left forearm palm-down and fully supported. Your elbow should be resting below the height of your shoulder. Allow your wrist to extend over the edge of the surface.  Loosely holding a __________ weight or a piece of rubber exercise band/tubing, slowly curl your hand up toward your forearm.  Hold this position for __________ seconds. Slowly lower the wrist back to the starting position in a controlled manner. Repeat __________ times. Complete this exercise __________ times per day.  STRENGTH - Ulnar Deviators  Stand with a ____________________ weight in your right / left hand or sit holding on to the rubber exercise band/tubing with your opposite arm supported.  Move your wrist so that your pinkie travels toward your forearm and your thumb moves away from your forearm.  Hold this position for __________ seconds and then slowly lower the wrist back to the starting position. Repeat __________ times. Complete this exercise  __________ times per day STRENGTH - Radial Deviators  Stand with a ____________________ weight in your right / left hand or sit holding on to the rubber exercise band/tubing with your arm supported.  Raise your hand upward in front of you or pull up on the rubber tubing.  Hold this position for __________ seconds and then slowly lower the wrist back to the starting position. Repeat __________ times. Complete this exercise __________ times per day. STRENGTH - Grip  Grasp a tennis ball, a dense sponge, or a large, rolled sock in your hand.  Squeeze as hard as you can without increasing any pain.  Hold this position for __________ seconds. Release your grip slowly. Repeat __________ times. Complete this exercise __________ times per day.  Document Released: 01/04/2005 Document Revised: 03/29/2011 Document Reviewed: 04/18/2008 [facility] Patient Information 2014 [place], [place]. Constipation, Adult Constipation is when a person has fewer than 3 bowel movements a week; has difficulty having a bowel movement; or has stools that are dry, hard, or larger than normal. As people grow older, constipation is more common. If you try to fix constipation with medicines that make you have a bowel movement (laxatives), the problem may get worse. Long-term laxative use may cause the muscles of the colon  to become weak. A low-fiber diet, not taking in enough fluids, and taking certain medicines may make constipation worse. CAUSES   Certain medicines, such as antidepressants, pain medicine, iron supplements, antacids, and water pills.   Certain diseases, such as diabetes, irritable bowel syndrome (IBS), thyroid disease, or depression.   Not drinking enough water.   Not eating enough fiber-rich foods.   Stress or travel.  Lack of physical activity or exercise.  Not going to the restroom when there is the urge to have a bowel movement.  Ignoring the urge to have a bowel movement.  Using laxatives  too much. SYMPTOMS   Having fewer than 3 bowel movements a week.   Straining to have a bowel movement.   Having hard, dry, or larger than normal stools.   Feeling full or bloated.   Pain in the lower abdomen.  Not feeling relief after having a bowel movement. DIAGNOSIS  Your caregiver will take a medical history and perform a physical exam. Further testing may be done for severe constipation. Some tests may include:   A barium enema X-ray to examine your rectum, colon, and sometimes, your small intestine.  A sigmoidoscopy to examine your lower colon.  A colonoscopy to examine your entire colon. TREATMENT  Treatment will depend on the severity of your constipation and what is causing it. Some dietary treatments include drinking more fluids and eating more fiber-rich foods. Lifestyle treatments may include regular exercise. If these diet and lifestyle recommendations do not help, your caregiver may recommend taking over-the-counter laxative medicines to help you have bowel movements. Prescription medicines may be prescribed if over-the-counter medicines do not work.  HOME CARE INSTRUCTIONS   Increase dietary fiber in your diet, such as fruits, vegetables, whole grains, and beans. Limit high-fat and processed sugars in your diet, such as [place] fries, hamburgers, cookies, candies, and soda.   A fiber supplement may be added to your diet if you cannot get enough fiber from foods.   Drink enough fluids to keep your urine clear or pale yellow.   Exercise regularly or as directed by your caregiver.   Go to the restroom when you have the urge to go. Do not hold it.  Only take medicines as directed by your caregiver. Do not take other medicines for constipation without talking to your caregiver first. SEEK IMMEDIATE MEDICAL CARE IF:   You have bright red blood in your stool.   Your constipation lasts for more than 4 days or gets worse.   You have abdominal or rectal pain.    You have thin, pencil-like stools.  You have unexplained weight loss. MAKE SURE YOU:   Understand these instructions.  Will watch your condition.  Will get help right away if you are not doing well or get worse. Document Released: 10/03/2003 Document Revised: 03/29/2011 Document Reviewed: 12/08/2010 [facility] Patient Information 2014 [place], [place].

## 2012-07-23 NOTE — Progress Notes (Signed)
[facility]:  Office Visit  Chief Complaint:  Chief Complaint  Patient presents with  . Abdominal Pain    x 1 week  . Constipation  . Numbness    Left 5th digit-x 3-4 days    HPI: Bradley Morales is a 32 y.o. male who complains of: 1. Abdominal pain x 1 week. He feels like food he eats gets stuck in the middle, + burping, Denies nausea, Deneis diarrhea. He has to strain during [facility], he had one this morning. Dr. Merla Riches put him on probiotics for 1 month. No melena, no hematuria. 2. He is right handed.  He has numbness and occ tingling in left pinky finger x 3-4 days.  He has some neck pain, he uses his CPAP and has not gotten used to it. He can't sleep very well. He has some shoulder problems with certain ROM. Achy He does a lot of repetitive motion, sanding at work. Achey  pain that radiates around shoulder joint. Mainly on left but sometimes on both. + weakness, but every once in a while he loses his grip 1-2 time a day.   Past Medical History  Diagnosis Date  . Hypertension   . Bell's palsy    Past Surgical History  Procedure Laterality Date  . Vasectomy     History   Social History  . Marital Status: Married    Spouse Name: N/A    Number of Children: N/A  . Years of Education: N/A   Occupational History  . 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 Smoking status: Current Every Day Smoker  . Smokeless tobacco: None     Comment: uses an electronic cigarette  . Alcohol Use: No  . Drug Use: No  . Sexually Active: Yes    Birth Control/ Protection: None     Comment: 1 partner in last 12 months   Other Topics Concern  . None   Social History Narrative  . None   Family History  Problem Relation Age of Onset  . Hypertension Mother   . Hypertension Father    No Known Allergies Prior to Admission medications   Medication Sig Start Date End Date Taking? Authorizing Provider  Ascorbic Acid (VITAMIN C) 1000 MG tablet Take 1,000  mg by mouth daily.   Yes Historical Provider, MD  Brewers Yeast TABS Take 1 tablet by mouth daily.    Yes Historical Provider, MD  cholecalciferol (VITAMIN D) 1000 UNITS tablet Take 2,000 Units by mouth daily.   Yes Historical Provider, MD  cyclobenzaprine (FLEXERIL) 10 MG tablet Take 1 tablet (10 mg total) by mouth 3 (three) times daily as needed for muscle spasms. 07/19/12  Yes Phillips Odor, MD  Eszopiclone 3 MG TABS Take 1 tablet (3 mg total) by mouth at bedtime. Take immediately before bedtime 05/29/12  Yes Tonye Pearson, MD  [place] Si Tea, Camillia sinensis, (GREEN TEA PO) Take 1 capsule by mouth daily.   Yes Historical Provider, MD  HYDROcodone-acetaminophen (NORCO) 5-325 MG per tablet Take 1-2 tablets by mouth every 4 (four) hours as needed for pain. 07/19/12  Yes Phillips Odor, MD  lisinopril (PRINIVIL,ZESTRIL) 5 MG tablet Take 1 tablet (5 mg total) by mouth daily. 07/11/12  Yes Tonye Pearson, MD  naproxen sodium (ANAPROX DS) 550 MG tablet Take 1 tablet (550 mg total) by mouth 2 (two) times daily with a meal. 07/19/12 07/19/13 Yes Phillips Odor, MD  butalbital-acetaminophen-caffeine (FIORICET, ESGIC) 50-325-40 MG per  tablet Take 1 tablet by mouth every 6 (six) hours as needed for headache. 05/29/12   Tonye Pearson, MD     ROS: The patient denies fevers, chills, night sweats, unintentional weight loss, chest pain, palpitations, wheezing, dyspnea on exertion, nausea, vomiting, abdominal pain, dysuria, hematuria, melena, weakness; + numbness, , +  tingling.   All other systems have been reviewed and were otherwise negative with the exception of those mentioned in the HPI and as above.    PHYSICAL EXAM: Filed Vitals:   07/23/12 0852  BP: 136/80  Pulse: 84  Temp: 98.2 F (36.8 C)  Resp: 16   Filed Vitals:   07/23/12 0852  Height: 5\' 10"  (1.778 m)  Weight: 319 lb (144.697 kg)   Body mass index is 45.77 kg/(m^2).  General: Alert, no acute distress HEENT:  Normocephalic,  atraumatic, oropharynx patent.  Cardiovascular:  Regular rate and rhythm, no rubs murmurs or gallops.  No Carotid bruits, radial pulse intact. No pedal edema.  Respiratory: Clear to auscultation bilaterally.  No wheezes, rales, or rhonchi.  No cyanosis, no use of accessory musculature GI: No organomegaly, abdomen is soft and non-tender, positive bowel sounds.  No masses. Skin: No rashes. Neurologic: Facial musculature symmetric. Psychiatric: Patient is appropriate throughout our interaction. Lymphatic: No cervical lymphadenopathy Musculoskeletal: Gait intact. Neck-nl, neg spurling Shoulder-nl, no impingement Elbow-+ tinel's on ulnar side Wrist-normal 5/5 strength, 2/2 DTRs   LABS: Results for orders placed in visit on 07/11/12  COMPREHENSIVE METABOLIC PANEL      Result Value Range   Sodium 141  135 - 145 mEq/L   Potassium 4.1  3.5 - 5.3 mEq/L   Chloride 107  96 - 112 mEq/L   CO2 23  19 - 32 mEq/L   Glucose, Bld 82  70 - 99 mg/dL   BUN 6  6 - 23 mg/dL   Creat 4.09  8.11 - 9.14 mg/dL   Total Bilirubin 0.8  0.3 - 1.2 mg/dL   Alkaline Phosphatase 62  39 - 117 U/L   AST 23  0 - 37 U/L   ALT 27  0 - 53 U/L   Total Protein 7.0  6.0 - 8.3 g/dL   Albumin 4.5  3.5 - 5.2 g/dL   Calcium 9.5  8.4 - 78.2 mg/dL  POCT CBC      Result Value Range   WBC 8.3  4.6 - 10.2 K/uL   Lymph, poc 2.4  0.6 - 3.4   POC LYMPH PERCENT 28.4  10 - 50 %L   MID (cbc) 0.6  0 - 0.9   POC MID % 7.8  0 - 12 %M   POC Granulocyte 5.3  2 - 6.9   Granulocyte percent 63.8  37 - 80 %G   RBC 5.15  4.69 - 6.13 M/uL   Hemoglobin 15.0  14.1 - 18.1 g/dL   HCT, POC 95.6  21.3 - 53.7 %   MCV 88.9  80 - 97 fL   MCH, POC 29.1  27 - 31.2 pg   MCHC 32.8  31.8 - 35.4 g/dL   RDW, POC 08.6     Platelet Count, POC 214  142 - 424 K/uL   MPV 10.0  0 - 99.8 fL  GLUCOSE, POCT (MANUAL RESULT ENTRY)      Result Value Range   POC Glucose 81  70 - 99 mg/dl  POCT GLYCOSYLATED HEMOGLOBIN (HGB A1C)      Result Value Range    Hemoglobin A1C 4.8  POCT URINALYSIS DIPSTICK      Result Value Range   Color, UA yellow     Clarity, UA clear     Glucose, UA neg     Bilirubin, UA neg     Ketones, UA neg     Spec Grav, UA 1.020     Blood, UA neg     pH, UA 6.5     Protein, UA neg     Urobilinogen, UA 0.2     Nitrite, UA neg     Leukocytes, UA Negative    POCT UA - MICROSCOPIC ONLY      Result Value Range   WBC, Ur, HPF, POC 0-1     RBC, urine, microscopic 0-2     Bacteria, U Microscopic neg     Mucus, UA neg     Epithelial cells, urine per micros 0-1     Crystals, Ur, HPF, POC neg     Casts, Ur, LPF, POC neg     Yeast, UA neg       EKG/XRAY:   Primary read interpreted by Dr. Conley Rolls at [facility]. No dislocation/fractures   ASSESSMENT/PLAN: Encounter Diagnoses  Name Primary?  . Paresthesia of hand Yes  . Elbow joint pain, left   . Unspecified constipation    Rx Mobic 7.5 mg x 2 pills PO daily then adjust to 1 pill daily prn DC hydrocodone or at least hold until constipation resolved Miralax and colace F/u prn    Halia Franey PHUONG, DO 07/23/2012 10:38 AM

## 2012-08-20 ENCOUNTER — Ambulatory Visit (INDEPENDENT_AMBULATORY_CARE_PROVIDER_SITE_OTHER): Payer: 59 | Admitting: Internal Medicine

## 2012-08-20 ENCOUNTER — Ambulatory Visit: Payer: 59

## 2012-08-20 VITALS — BP 133/75 | HR 74 | Temp 98.0°F | Resp 18 | Wt 313.0 lb

## 2012-08-20 DIAGNOSIS — M549 Dorsalgia, unspecified: Secondary | ICD-10-CM

## 2012-08-20 MED ORDER — IBUPROFEN 600 MG PO TABS: 600.0000 mg | ORAL_TABLET | Freq: Three times a day (TID) | ORAL | Status: DC | PRN

## 2012-08-20 MED ORDER — METHOCARBAMOL 750 MG PO TABS: 750.0000 mg | ORAL_TABLET | Freq: Four times a day (QID) | ORAL | Status: DC

## 2012-08-20 NOTE — Progress Notes (Signed)
  Subjective:    Patient ID: Bradley Morales, male    DOB: Dec 26, 1980, 32 y.o.   MRN: [id number]  HPI  32 YO male patient here with lumbar back and left hip pain. He was building a deck at his residence and fell off the deck. It was roughly 3.5 feet. He landed on his left side. He denies hitting his head. He continued to work on his deck the day of the injury.   It hurts more in the mornings. He has taken Norco and Flexeril to try to relieve the pain. States that he has some tingling in his left leg. Denies any trouble with bowel or bladder control.   He has a history of back injury. In March this year he strained his back lifting materials. He wants to go to work tomorrow, does truck body work. No weakness, numbness, incontinence, or function loss.  Review of Systems     Objective:   Physical Exam  Constitutional: He is oriented to person, place, and time. He appears well-developed and well-nourished.  HENT:  Right Ear: External ear normal.  Left Ear: External ear normal.  Eyes: EOM are normal. No scleral icterus.  Neck: Neck supple.  Cardiovascular: Normal rate.   Pulmonary/Chest: Effort normal.  Musculoskeletal: He exhibits tenderness. He exhibits no edema.       Lumbar back: He exhibits decreased range of motion, tenderness, bony tenderness, pain and spasm. He exhibits no swelling, no edema, no deformity and normal pulse.  Neurological: He is alert and oriented to person, place, and time. He has normal strength and normal reflexes. No cranial nerve deficit or sensory deficit. He exhibits normal muscle tone. Coordination and gait abnormal.  Psychiatric: He has a normal mood and affect.    [facility] reading (PRIMARY) by  Dr Perrin Maltese no fx seen        Assessment & Plan:  Back manual and care/RICE Robaxin/Motrin

## 2012-08-20 NOTE — Patient Instructions (Addendum)
Back Pain, Adult  Low back pain is very common. About 1 in 5 people have back pain. The cause of low back pain is rarely dangerous. The pain often gets better over time. About half of people with a sudden onset of back pain feel better in just 2 weeks. About 8 in 10 people feel better by 6 weeks.   CAUSES  Some common causes of back pain include:  · Strain of the muscles or ligaments supporting the spine.  · Wear and tear (degeneration) of the spinal discs.  · Arthritis.  · Direct injury to the back.  DIAGNOSIS  Most of the time, the direct cause of low back pain is not known. However, back pain can be treated effectively even when the exact cause of the pain is unknown. Answering your caregiver's questions about your overall health and symptoms is one of the most accurate ways to make sure the cause of your pain is not dangerous. If your caregiver needs more information, he or she may order lab work or imaging tests (X-rays or MRIs). However, even if imaging tests show changes in your back, this usually does not require surgery.  HOME CARE INSTRUCTIONS  For many people, back pain returns. Since low back pain is rarely dangerous, it is often a condition that people can learn to manage on their own.   · Remain active. It is stressful on the back to sit or stand in one place. Do not sit, drive, or stand in one place for more than 30 minutes at a time. Take short walks on level surfaces as soon as pain allows. Try to increase the length of time you walk each day.  · Do not stay in bed. Resting more than 1 or 2 days can delay your recovery.  · Do not avoid exercise or work. Your body is made to move. It is not dangerous to be active, even though your back may hurt. Your back will likely heal faster if you return to being active before your pain is gone.  · Pay attention to your body when you  bend and lift. Many people have less discomfort when lifting if they bend their knees, keep the load close to their bodies, and  avoid twisting. Often, the most comfortable positions are those that put less stress on your recovering back.  · Find a comfortable position to sleep. Use a firm mattress and lie on your side with your knees slightly bent. If you lie on your back, put a pillow under your knees.  · Only take over-the-counter or prescription medicines as directed by your caregiver. Over-the-counter medicines to reduce pain and inflammation are often the most helpful. Your caregiver may prescribe muscle relaxant drugs. These medicines help dull your pain so you can more quickly return to your normal activities and healthy exercise.  · Put ice on the injured area.  · Put ice in a plastic bag.  · Place a towel between your skin and the bag.  · Leave the ice on for 15-20 minutes, 3-4 times a day for the first 2 to 3 days. After that, ice and heat may be alternated to reduce pain and spasms.  · Ask your caregiver about trying back exercises and gentle massage. This may be of some benefit.  · Avoid feeling anxious or stressed. Stress increases muscle tension and can worsen back pain. It is important to recognize when you are anxious or stressed and learn ways to manage it. Exercise is a great option.  SEEK MEDICAL CARE IF:  · You have pain that is not relieved with rest or   medicine.  · You have pain that does not improve in 1 week.  · You have new symptoms.  · You are generally not feeling well.  SEEK IMMEDIATE MEDICAL CARE IF:   · You have pain that radiates from your back into your legs.  · You develop new bowel or bladder control problems.  · You have unusual weakness or numbness in your arms or legs.  · You develop nausea or vomiting.  · You develop abdominal pain.  · You feel faint.  Document Released: 01/04/2005 Document Revised: 07/06/2011 Document Reviewed: 05/25/2010  ExitCare® Patient Information ©2014 [employer].

## 2012-08-21 ENCOUNTER — Ambulatory Visit: Payer: 59

## 2012-08-21 ENCOUNTER — Ambulatory Visit (INDEPENDENT_AMBULATORY_CARE_PROVIDER_SITE_OTHER): Payer: 59 | Admitting: Family Medicine

## 2012-08-21 VITALS — BP 132/86 | HR 95 | Temp 97.9°F | Resp 18 | Ht 71.0 in | Wt 315.0 lb

## 2012-08-21 DIAGNOSIS — M545 Low back pain: Secondary | ICD-10-CM

## 2012-08-21 DIAGNOSIS — M25552 Pain in left hip: Secondary | ICD-10-CM

## 2012-08-21 DIAGNOSIS — M25559 Pain in unspecified hip: Secondary | ICD-10-CM

## 2012-08-21 MED ORDER — PREDNISONE 20 MG PO TABS: ORAL_TABLET | ORAL | Status: DC

## 2012-08-21 MED ORDER — HYDROCODONE-ACETAMINOPHEN 5-325 MG PO TABS: ORAL_TABLET | ORAL | Status: DC

## 2012-08-21 NOTE — Patient Instructions (Addendum)
If you are not feeling better in the next few days please let us know-Sooner if worse.

## 2012-08-21 NOTE — Progress Notes (Signed)
[facility] [address], [place] [address] [phone]  Date:  08/21/2012   Name:  Bradley Morales   DOB:  14-Jul-1980   MRN:  [id number]  PCP:  Tonye Pearson, MD    Chief Complaint: Follow-up   History of Present Illness:  Bradley C Sommers is a 32 y.o. very pleasant male patient who presents with the following:  On 08/18/12 he was building a deck at his home.  He fell backwards off of the deck and landed on his left buttock- he broke his fall with his arm.  He was here yesterday due to back pain, but is here today with pain that seems to be more into his left leg.   He notes pain now going down his left leg. No numbness of his leg- "maybe a little tingling."   No bowel or bladder incontinence.    He has had some back strains in the past, but never had any surgery, etc.    He took flexeril last night- he has not yet picked up the robaxin.  He had leftover hydrocodone at home which he has used- he just has a couple of these left.     Patient Active Problem List   Diagnosis Date Noted  . Palpitations 04/06/2012  . OSA (obstructive sleep apnea) 03/06/2012  . Nicotine addiction 11/05/2011  . BMI 45.0-49.9, adult 11/05/2011    Past Medical History  Diagnosis Date  . Hypertension   . Bell's palsy     Past Surgical History  Procedure Laterality Date  . Vasectomy      History  Substance Use Topics  . Smoking status: Current Every Day Smoker  . Smokeless tobacco: Not on file     Comment: uses an electronic cigarette  . Alcohol Use: Yes    Family History  Problem Relation Age of Onset  . Hypertension Mother   . Hypertension Father     No Known Allergies  Medication list has been reviewed and updated.  Current Outpatient Prescriptions on File Prior to Visit  Medication Sig Dispense Refill  . Ascorbic Acid (VITAMIN C) 1000 MG tablet Take 1,000 mg by mouth daily.      Janene Madeira Yeast TABS Take 1 tablet by mouth daily.       .  butalbital-acetaminophen-caffeine (FIORICET, ESGIC) 50-325-40 MG per tablet Take 1 tablet by mouth every 6 (six) hours as needed for headache.  20 tablet  0  . cholecalciferol (VITAMIN D) 1000 UNITS tablet Take 2,000 Units by mouth daily.      . cyclobenzaprine (FLEXERIL) 10 MG tablet Take 1 tablet (10 mg total) by mouth 3 (three) times daily as needed for muscle spasms.  30 tablet  0  . Eszopiclone 3 MG TABS Take 1 tablet (3 mg total) by mouth at bedtime. Take immediately before bedtime  30 tablet  2  . Green Tea, Camillia sinensis, (GREEN TEA PO) Take 1 capsule by mouth daily.      Marland Kitchen HYDROcodone-acetaminophen (NORCO) 5-325 MG per tablet Take 1-2 tablets by mouth every 4 (four) hours as needed for pain.  30 tablet  0  . ibuprofen (ADVIL,MOTRIN) 600 MG tablet Take 1 tablet (600 mg total) by mouth every 8 (eight) hours as needed for pain.  30 tablet  0  . lisinopril (PRINIVIL,ZESTRIL) 5 MG tablet Take 1 tablet (5 mg total) by mouth daily.  90 tablet  3  . meloxicam (MOBIC) 7.5 MG tablet Take 2 tabs  PO daily then prn  30 tablet  0  . methocarbamol (ROBAXIN-750) 750 MG tablet Take 1 tablet (750 mg total) by mouth 4 (four) times daily.  40 tablet  1   No current facility-administered medications on file prior to visit.    Review of Systems:  As per HPI- otherwise negative.   Physical Examination: Filed Vitals:   08/21/12 1406  BP: 152/80  Pulse: 95  Temp: 97.9 F (36.6 C)  Resp: 18   Filed Vitals:   08/21/12 1406  Height: 5\' 11"  (1.803 m)  Weight: 315 lb (142.883 kg)   Body mass index is 43.95 kg/(m^2). Ideal Body Weight: Weight in (lb) to have BMI = 25: 178.9  GEN: WDWN, NAD, Non-toxic, A & O x 3, obese HEENT: Atraumatic, Normocephalic. Neck supple. No masses, No LAD. Ears and Nose: No external deformity. CV: RRR, No M/G/R. No JVD. No thrill. No extra heart sounds. PULM: CTA B, no wheezes, crackles, rhonchi. No retractions. No resp. distress. No accessory muscle use. ABD: S, NT,  ND, +BS. No rebound. No HSM. EXTR: No c/c/e NEURO Normal gait. He has normal LE strength and sensation, normal DTR.   Back: he has tenderness over the left lumbar muscles, more so over the left sciatic notch.  Positive pain with SLR on the left.   PSYCH: Normally interactive. Conversant. Not depressed or anxious appearing.  Calm demeanor.   [facility] reading (PRIMARY) by  Dr. Patsy Lager. Left hip: negative LEFT HIP - COMPLETE 2+ VIEW  Comparison: None  Findings: Normal alignment and no fracture. Negative for AVN. Mild joint space narrowing in the hips bilaterally.  IMPRESSION: Mild degenerative change of both hips. No acute abnormality.  Clinically significant discrepancy from primary report, if provided: None  Assessment and Plan: Pain in left hip - Plan: DG Hip Complete Left, CANCELED: DG Pelvis 1-2 Views  Back pain, lumbosacral - Plan: predniSONE (DELTASONE) 20 MG tablet, HYDROcodone-acetaminophen (NORCO) 5-325 MG per tablet  Pain in left hip and lower back after a fall from a porch.  He has no used the ibuprofen, but is just taking some leftover flexeril and hydrocodone that he had at home.  Refilled hydrocodone and gave rx for prednisone, avoid NSAIDs while on prednisone.  Let me know if not better.  Cautioned regarding sedation with these medications.    Signed Abbe Amsterdam, MD

## 2012-08-30 ENCOUNTER — Ambulatory Visit (INDEPENDENT_AMBULATORY_CARE_PROVIDER_SITE_OTHER): Payer: 59 | Admitting: Internal Medicine

## 2012-08-30 ENCOUNTER — Encounter: Payer: Self-pay | Admitting: Internal Medicine

## 2012-08-30 VITALS — BP 142/84 | HR 102 | Temp 97.9°F | Resp 16 | Ht 71.0 in | Wt 308.0 lb

## 2012-08-30 DIAGNOSIS — M549 Dorsalgia, unspecified: Secondary | ICD-10-CM

## 2012-08-30 DIAGNOSIS — J019 Acute sinusitis, unspecified: Secondary | ICD-10-CM

## 2012-08-30 DIAGNOSIS — B079 Viral wart, unspecified: Secondary | ICD-10-CM

## 2012-08-30 DIAGNOSIS — I10 Essential (primary) hypertension: Secondary | ICD-10-CM

## 2012-08-30 MED ORDER — TRAMADOL HCL 50 MG PO TABS: 50.0000 mg | ORAL_TABLET | Freq: Four times a day (QID) | ORAL | Status: DC | PRN

## 2012-08-30 MED ORDER — AMOXICILLIN 500 MG PO CAPS: 1000.0000 mg | ORAL_CAPSULE | Freq: Two times a day (BID) | ORAL | Status: AC

## 2012-08-30 NOTE — Progress Notes (Signed)
  Subjective:    Patient ID: Bradley Morales, male    DOB: 1980/09/27, 32 y.o.   MRN: [id number]  HPI followup for hypertension Also complaining of persistent warty growths on both feet that are sensitive/he has cut these out but they continue to come back/never any bleeding/ His abdominal symptoms are better at this point He continues to use CPAP for most of the night but not for the last few hours however he denies daytime hypersomnolence at this point  Home blood pressures are good  For the past week he has had purulent postnasal drainage with sinus pressure causing headaches and followed catching a virus from his child.  Note recent visits where he had a fall injury created back pain. He is slowly improving.   Review of Systems No weight loss No chest pain or palpitations No daytime hypersomnolence Unable to quit smoking Has a habit of eating a large meal at bedtime but otherwise does not eat during the day    Objective:   Physical Exam BP 142/84  Pulse 102  Temp(Src) 97.9 F (36.6 C) (Oral)  Resp 16  Ht 5\' 11"  (1.803 m)  Wt 308 lb (139.708 kg)  BMI 42.98 kg/m2  SpO2 99% Nares boggy with purulent discharge/tender maxillary sinuses bilaterally Oropharynx clear No thyromegaly Heart regular without murmur Lungs clear No peripheral edema/good peripheral pulses Straight leg raise to 90 within normal limits bilaterally Tender generally over the lumbar area No peripheral sensory or motor losses Both plantar surfaces have growths that are 0.5 cm and clear on the surface although the right side is tender      Assessment & Plan:  HTN (hypertension)--- no change in medication  Acute sinusitis, unspecified--- amoxicillin  Back pain--continue medication/continue exercises/okay to refill Tram  Wart versus callus--will wear moleskin for several weeks to have surface more defined

## 2012-09-27 ENCOUNTER — Other Ambulatory Visit: Payer: Self-pay | Admitting: Internal Medicine

## 2012-10-18 ENCOUNTER — Encounter: Payer: Self-pay | Admitting: Internal Medicine

## 2012-12-27 ENCOUNTER — Ambulatory Visit (INDEPENDENT_AMBULATORY_CARE_PROVIDER_SITE_OTHER): Payer: 59 | Admitting: Internal Medicine

## 2012-12-27 ENCOUNTER — Encounter: Payer: Self-pay | Admitting: Internal Medicine

## 2012-12-27 VITALS — BP 138/87 | HR 81 | Temp 97.8°F | Resp 16 | Ht 70.0 in | Wt 308.0 lb

## 2012-12-27 DIAGNOSIS — F17200 Nicotine dependence, unspecified, uncomplicated: Secondary | ICD-10-CM

## 2012-12-27 DIAGNOSIS — Z0000 Encounter for general adult medical examination without abnormal findings: Secondary | ICD-10-CM

## 2012-12-27 DIAGNOSIS — Z6841 Body Mass Index (BMI) 40.0 and over, adult: Secondary | ICD-10-CM

## 2012-12-27 DIAGNOSIS — I10 Essential (primary) hypertension: Secondary | ICD-10-CM

## 2012-12-27 DIAGNOSIS — G4733 Obstructive sleep apnea (adult) (pediatric): Secondary | ICD-10-CM

## 2012-12-27 NOTE — Progress Notes (Signed)
   Subjective:    Patient ID: Bradley Morales, male    DOB: 22-Mar-1980, 32 y.o.   MRN: [id number]  HPI    Review of Systems  HENT: Positive for sinus pressure.   Musculoskeletal: Positive for back pain and neck stiffness.  Allergic/Immunologic: Positive for environmental allergies.       Objective:   Physical Exam        Assessment & Plan:

## 2012-12-27 NOTE — Progress Notes (Signed)
   Subjective:    Patient ID: Bradley Morales, male    DOB: 01-08-1981, 32 y.o.   MRN: [id number]  HPI Patient presents today for CPE  Last CPE- never Flu vaccine- this year Tetanus- 01/18/10 Dentist- this year Eye- this year  Blood pressure runs 105-140s/70-80s, brought in extensive record today. Notices that it increases in the afternoons.  Has had CPAP machine for 6 years-apria. Doesn't feel like it works well. Thinks he needs a new machine. Making funny noise. Has never beeen happy with this machine.  eval 2008--Dr D---8cm pressure  Exercise- none x work  Diet- doesn't watch what he eats. Drinks 3-4 20 oz regular Dr. Reino Kent sodas a day. Eats piece of bread in the morning, no lunch, large dinner.  Married with 36 year old daughter.  Fell 3-4 months ago while building a deck. Landed on left hip/buttock. Has pain with straight leg raise, no numbness, no tingling.  Pain is slowly improving, doesn't require any meds for pain. Sometimes radicular fr L LS.  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 Does not require meds for pain.  Seasonal allergies not currently bothering him. Has nasal congestion in the morning, believes this is from CPAP.  Review of Systems  Constitutional: Negative.   HENT: Positive for sinus pressure.   Eyes: Negative.   Respiratory: Negative.   Cardiovascular: Negative.   Gastrointestinal: Negative.   Endocrine: Negative.   Genitourinary: Negative.   Musculoskeletal: Positive for back pain and neck pain.  Allergic/Immunologic: Positive for environmental allergies.  Neurological: Negative.   Hematological: Negative.   Psychiatric/Behavioral: Negative.        Objective:   Physical Exam  Nursing note and vitals reviewed. Constitutional: He is oriented to person, place, and time. He appears well-developed and well-nourished.  HENT:  Head: Normocephalic.  Right Ear: Tympanic membrane, external ear and ear canal normal.  Left Ear: Tympanic  membrane, external ear and ear canal normal.  Nose: Nose normal.  Mouth/Throat: Oropharynx is clear and moist.  Eyes: Pupils are equal, round, and reactive to light.  Neck: Normal range of motion. Neck supple.  Cardiovascular: Normal rate, regular rhythm, normal heart sounds and intact distal pulses.   Pulmonary/Chest: Effort normal and breath sounds normal.  Abdominal: Soft. Bowel sounds are normal.  Musculoskeletal: Normal range of motion. He exhibits no edema and no tenderness.  Tender L gr troch to palp and hip stretch slr pos 85  Neurological: He is alert and oriented to person, place, and time. He has normal reflexes.  Skin: Skin is warm and dry.  Soles of feet with thickened skin. No cracking or redness.  Psychiatric: He has a normal mood and affect. His behavior is normal. Judgment and thought content normal.   Weight down 22 pounds this year.     Assessment & Plan:  CPE -HTN (hypertension)-mild /cont low dose  Continue lisinopril 5 mg and continue to monitor blood pressure  -BMI 45.0-49.9, adult Encouraged weight loss- decreased soda consumption, decreased portions, increased activity.  -OSA-needs reeval//ref dr Dohmeier  -Hx bell's pals w/ sl residual  -GERD stable  -Varicosities under rx Dr Beaulah Corin.  -Nic Addic hx  Has meds     -I have completed the patient encounter in its entirety as documented by FNP Leone Payor with editing by me where necessary. Lequita Meadowcroft P. Merla Riches, M.D.

## 2013-01-03 ENCOUNTER — Ambulatory Visit (INDEPENDENT_AMBULATORY_CARE_PROVIDER_SITE_OTHER): Payer: 59 | Admitting: Neurology

## 2013-01-03 ENCOUNTER — Encounter: Payer: Self-pay | Admitting: Neurology

## 2013-01-03 ENCOUNTER — Encounter (INDEPENDENT_AMBULATORY_CARE_PROVIDER_SITE_OTHER): Payer: Self-pay

## 2013-01-03 VITALS — BP 129/72 | HR 62 | Resp 16 | Ht 70.0 in | Wt 314.0 lb

## 2013-01-03 DIAGNOSIS — G4733 Obstructive sleep apnea (adult) (pediatric): Secondary | ICD-10-CM

## 2013-01-03 DIAGNOSIS — R0609 Other forms of dyspnea: Secondary | ICD-10-CM

## 2013-01-03 DIAGNOSIS — R0683 Snoring: Secondary | ICD-10-CM | POA: Insufficient documentation

## 2013-01-03 DIAGNOSIS — G510 Bell's palsy: Secondary | ICD-10-CM

## 2013-01-03 NOTE — Progress Notes (Addendum)
[facility]  Provider:  Melvyn Novas, M D  Referring Provider: Tonye Pearson, MD Primary Care Physician:  Tonye Pearson, MD  Chief Complaint  Patient presents with  . SLEEP CONSULT    INT.REFERAL/OSA    HPI:  Bradley Morales is a 32 y.o. male  Is seen here as a referral  from Dr. Merla Riches for a sleep consultation :  The [facility] an [facility]. 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 He is followed by the D.O T. recommendations. The patient has a history of obesity as well as for her variable blood pressure is. About 6 years ago he was evaluated for sleep apnea after he developed chest pains, was tested  at the [facility] heart and sleep lab and after the sleep study was contacted  To pick up a CPAP machine - He concluded  He was given an explanation, per prescription he as provided a CPAP  machine through a durable medical equipment company.  [employer] Allegra) . He states that he has not found any usable assistance at the DME .  First of all  : only one interface  had been tried on him - he was never refitted , settings were never  changed and the machine was making funny noises:  in other words he has never been happy or compliant with his machines. Over 6 years ago it was set  to 8 cm pressure. The patient has used and nasal mask and he does have facial hair which would make a full face mask heart effect.  Is remarkable that the patient uses the machine about  Half of the night before he will take the mask off , mostly  the after bathroom break.  Sleep habits extremity normal : Bedroom is cool, quiet and dark.   Bedtime 9.30 PM,  he will promptly fall asleep. He will pretty regularly wake up at 2 in the morning and has no external  reason for it, he will have one bathroom break at  2 AM, but his  bathroom call oesn't wake him.  He has to arise at 5:30 in the morning to work. He seems to  be mostly spontaneously up and awake before his alarm rings.  He does not use any coffee in the morning. Drinks water during the day mostly,  sometimes  A SODA, does not longer exceed one  Every day.  He will have toast in the morning,  he often skips lunch, dinner is her regular family affair and his largest meal of the day.  The patient shares a bedroom with his wife. His wife reports that he is a sleep talker, restless, kicking or boxing has not been noticed. The patient reports that it doesn't seem to make a difference if he uses CPAP are not in that he is not salt refreshed not in the morning. He went one year without using it and didn't see a difference at all. Meanwhile in comparison to a study 6 years ago he has gained some additional weight and this is likely a sign that he may half apnea perhaps even to a higher degree. He does remember that he was told initially that his sleep apnea was borderline and that treatment was optional.  Review of Systems: Out of a complete 14 system review, the patient complains of only the following symptoms, and all other reviewed systems are negative. He endorsed  today the upper sleepiness score at 1 single point and the fatigue severity score 18 points. No recent cardiac symptoms chest pain and no depression.   He has been a sleepy person since adulthood, no cataplexy or sleep paralysis, his father has witnessed apneas but was never tested. No other sleep disorders.      History   Social History  . Marital Status: Married    Spouse Name: N/A    Number of Children: N/A  . Years of Education: N/A   Occupational History  . 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 Smoking status: Current Every Day Smoker  . Smokeless tobacco: Not on file     Comment: uses an electronic cigarette  . Alcohol Use: Yes  . Drug Use: No  . Sexual Activity: Yes    Birth Control/ Protection: None     Comment: 1 partner in last 12 months   Other  Topics Concern  . Not on file   Social History Narrative  . No narrative on file    Family History  Problem Relation Age of Onset  . Hypertension Mother   . Hypertension Father   . Diabetes Father     Past Medical History  Diagnosis Date  . Hypertension   . Bell's palsy   . Snoring disorder     Past Surgical History  Procedure Laterality Date  . Vasectomy      Current Outpatient Prescriptions  Medication Sig Dispense Refill  . Ascorbic Acid (VITAMIN C) 1000 MG tablet Take 1,000 mg by mouth daily.      Janene Madeira Yeast TABS Take 1 tablet by mouth daily.       . cholecalciferol (VITAMIN D) 1000 UNITS tablet Take 2,000 Units by mouth daily.      Chilton Si Tea, Camillia sinensis, (GREEN TEA PO) Take 1 capsule by mouth daily.      Marland Kitchen lisinopril (PRINIVIL,ZESTRIL) 5 MG tablet Take 1 tablet (5 mg total) by mouth daily.  90 tablet  3   No current facility-administered medications for this visit.    Allergies as of 01/03/2013  . (No Known Allergies)    Vitals: BP 129/72  Pulse 62  Resp 16  Ht 5\' 10"  (1.778 m)  Wt 314 lb (142.429 kg)  BMI 45.05 kg/m2 Last Weight:  Wt Readings from Last 1 Encounters:  01/03/13 314 lb (142.429 kg)   Last Height:   Ht Readings from Last 1 Encounters:  01/03/13 5\' 10"  (1.778 m)     Physical exam:  General: The patient is awake, alert and appears not in acute distress. The patient is well groomed. Head: Normocephalic, atraumatic. Neck is supple. Mallampati 1 , neck circumference: 17, no retrognathia.  Cardiovascular:  Regular rate and rhythm , without  murmurs or carotid bruit, and without distended neck veins. Respiratory: Lungs are clear to auscultation. Skin:  Without evidence of edema, or rash Trunk: BMI is elevated , normal posture.  Neurologic exam : The patient is awake and alert, oriented to place and time.  Memory subjective   described as intact.  There is a normal attention span & concentration ability. Speech is fluent  without   dysarthria, dysphonia or aphasia. Mood and affect are appropriate.  Cranial nerves: Pupils are equal and briskly reactive to light. Funduscopic exam without  evidence of pallor or edema.  Extraocular movements  in vertical and horizontal planes intact and without nystagmus. Visual fields by finger perimetry are intact. Hearing  to finger rub intact.  Facial sensation intact to fine touch. Facial motor strength - patient has a right bell's palsy , a residual since he was in 8th grade. His tongue and uvula move midline.  Motor exam:    Normal tone and  muscle bulk and symmetric strength in all extremities.  Sensory:  Fine touch, pinprick and vibration were tested in all extremities. Proprioception is normal.  Coordination: Rapid alternating movements in the fingers/hands is tested and normal. Finger-to-nose maneuver without evidence of ataxia, dysmetria or tremor.  Gait and station: Patient walks without assistive device .  Deep tendon reflexes: in the  upper and lower extremities are symmetric and intact. Babinski maneuver response is downgoing.   Assessment:  After physical and neurologic examination, review of laboratory studies, imaging, neurophysiology testing and pre-existing records, assessment is  1)  Bell's palsy right face in 8th grade , left face in 2013.  Sarcoid, sjoegren's, ANA were possibly tested , but I cannot see these records.  2) normal upper airway, mallompotti 1 ,  Surprising as he is a loud snorer in the supine position  ,less loud on the side. He likes to sleep prone.  He has almost chronic nasal congestion.   Plan:  Treatment plan and additional workup : nasonex or rhinocort. Repeat sleep study , SPLIT [employer] at 20 and 4%,  Unless a HST is covered.

## 2013-01-03 NOTE — Patient Instructions (Signed)
Sleep Apnea  Sleep apnea is disorder that affects a person's sleep. A person with sleep apnea has abnormal pauses in their breathing when they sleep. It is hard for them to get a good sleep. This makes a person tired during the day. It also can lead to other physical problems. There are three types of sleep apnea. One type is when breathing stops for a short time because your airway is blocked (obstructive sleep apnea). Another type is when the brain sometimes fails to give the normal signal to breathe to the muscles that control your breathing (central sleep apnea). The third type is a combination of the other two types.  HOME CARE  · Do not sleep on your back. Try to sleep on your side.  · Take all medicine as told by your doctor.  · Avoid alcohol, calming medicines (sedatives), and depressant drugs.  · Try to lose weight if you are overweight. Talk to your doctor about a healthy weight goal.  Your doctor may have you use a device that helps to open your airway. It can help you get the air that you need. It is called a positive airway pressure (PAP) device. There are three types of PAP devices:  · Continuous positive airway pressure (CPAP) device.  · Nasal expiratory positive airway pressure (EPAP) device.  · Bilevel positive airway pressure (BPAP) device.  MAKE SURE YOU:  · Understand these instructions.  · Will watch your condition.  · Will get help right away if you are not doing well or get worse.  Document Released: 10/14/2007 Document Revised: 12/22/2011 Document Reviewed: 05/08/2011  ExitCare® Patient Information ©2014 [employer].

## 2013-01-07 ENCOUNTER — Ambulatory Visit (INDEPENDENT_AMBULATORY_CARE_PROVIDER_SITE_OTHER): Payer: 59 | Admitting: Emergency Medicine

## 2013-01-07 VITALS — BP 126/74 | HR 76 | Temp 98.2°F | Resp 16 | Ht 71.0 in | Wt 320.0 lb

## 2013-01-07 DIAGNOSIS — J209 Acute bronchitis, unspecified: Secondary | ICD-10-CM

## 2013-01-07 DIAGNOSIS — J029 Acute pharyngitis, unspecified: Secondary | ICD-10-CM

## 2013-01-07 LAB — POCT RAPID STREP A (OFFICE): Rapid Strep A Screen: NEGATIVE

## 2013-01-07 MED ORDER — AMOXICILLIN 875 MG PO TABS: 875.0000 mg | ORAL_TABLET | Freq: Two times a day (BID) | ORAL | Status: DC

## 2013-01-07 MED ORDER — HYDROCODONE-HOMATROPINE 5-1.5 MG/5ML PO SYRP: 5.0000 mL | ORAL_SOLUTION | Freq: Three times a day (TID) | ORAL | Status: DC | PRN

## 2013-01-07 MED ORDER — FLUTICASONE PROPIONATE 50 MCG/ACT NA SUSP: 2.0000 | Freq: Every day | NASAL | Status: DC

## 2013-01-07 NOTE — Patient Instructions (Signed)
Please take your medication as instructed. Return to clinic in 3-4 days if not feeling better

## 2013-01-07 NOTE — Progress Notes (Signed)
   Subjective:    Patient ID: Bradley Morales, male    DOB: [birth year], 32 y.o.   MRN: [id number]  HPI  Pt presents today with ST and head congestion that started Thurs night. This morning woke up with chest congestion and cough. Cough worse in the mornings.  No fever or myalgias. Pt did have a flu shot this year. He's tried OTC allergy meds and flu meds. No h/o of winter allergies. Typically has allergies in the summer. Daughter has been sick at home. Some of the phlegm he has been producing has had a slight yellowish color.     Review of Systems     Objective:   Physical Exam patient is alert and cooperative not ill-appearing his TMs are clear nose is congested posterior pharynx is slightly red .Marland Kitchen His neck is supple chest exam is clear to auscultation and percussion. Heart regular rate no murmurs. Abdomen soft nontender.  Results for orders placed in visit on 01/07/13  POCT RAPID STREP A (OFFICE)      Result Value Range   Rapid Strep A Screen Negative  Negative         Assessment & Plan:  This sounds like an upper respiratory infection with developing bronchitis. We'll treat with amoxicillin and Hycodan at night for cough Flonase to help with nasal congestion.

## 2013-01-21 ENCOUNTER — Other Ambulatory Visit: Payer: Self-pay | Admitting: Internal Medicine

## 2013-01-22 NOTE — Telephone Encounter (Signed)
Okay to refill one time Meds ordered this encounter  Medications  . traMADol (ULTRAM) 50 MG tablet    Sig: TAKE 1 TABLET BY MOUTH EVERY 6 HOURS AS NEEDED FOR PAIN    Dispense:  30 tablet    Refill:  0

## 2013-01-22 NOTE — Telephone Encounter (Signed)
Dr Laney Pastor, at 08/30/12 OV you said OK to RF Tramadol, but in 12/27/12 notes you mention pt doesn't need pain med any more. Do you want to RF?

## 2013-01-23 NOTE — Telephone Encounter (Signed)
faxed

## 2013-02-16 ENCOUNTER — Ambulatory Visit (INDEPENDENT_AMBULATORY_CARE_PROVIDER_SITE_OTHER): Payer: 59

## 2013-02-16 DIAGNOSIS — R0683 Snoring: Secondary | ICD-10-CM

## 2013-02-16 DIAGNOSIS — G510 Bell's palsy: Secondary | ICD-10-CM

## 2013-02-16 DIAGNOSIS — G4733 Obstructive sleep apnea (adult) (pediatric): Secondary | ICD-10-CM

## 2013-02-17 ENCOUNTER — Other Ambulatory Visit: Payer: Self-pay | Admitting: Internal Medicine

## 2013-02-20 NOTE — Telephone Encounter (Signed)
faxed

## 2013-02-25 ENCOUNTER — Ambulatory Visit (INDEPENDENT_AMBULATORY_CARE_PROVIDER_SITE_OTHER): Payer: 59 | Admitting: Family Medicine

## 2013-02-25 VITALS — BP 130/80 | HR 86 | Temp 98.0°F | Resp 18 | Ht 70.25 in | Wt 307.0 lb

## 2013-02-25 DIAGNOSIS — M5410 Radiculopathy, site unspecified: Secondary | ICD-10-CM

## 2013-02-25 DIAGNOSIS — IMO0002 Reserved for concepts with insufficient information to code with codable children: Secondary | ICD-10-CM

## 2013-02-25 DIAGNOSIS — M545 Low back pain, unspecified: Secondary | ICD-10-CM

## 2013-02-25 DIAGNOSIS — M5430 Sciatica, unspecified side: Secondary | ICD-10-CM

## 2013-02-25 MED ORDER — CYCLOBENZAPRINE HCL 10 MG PO TABS: 10.0000 mg | ORAL_TABLET | Freq: Three times a day (TID) | ORAL | Status: DC | PRN

## 2013-02-25 NOTE — Progress Notes (Signed)
Chief Complaint:  Chief Complaint  Patient presents with  . Back Pain    Sciatica - left leg  - not getting any better    HPI: Bradley Morales is a 33 y.o. male who is here for  Sciatica pain in his left leg. He states that he is need of a muscle relaxer to help with the pain. Denies CP, SOB. He has no other complaints. He fell 8 mnths ago off deck and was originally given prednisone which helped but he has pain that starts on left buttock and has constant shooting pain that radiates to  To the front after it passes the knee, Mainily 6-7/10 sharp pain but sometimes can be 10/10. Worse if he is sitting and better if he is staning. Mom had a problem. Denies numbness but has pain  Past Medical History  Diagnosis Date  . Hypertension   . Bell's palsy   . Snoring disorder    Past Surgical History  Procedure Laterality Date  . Vasectomy     History   Social History  . Marital Status: Married    Spouse Name: N/A    Number of Children: N/A  . Years of Education: N/A   Occupational History  . 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 Smoking status: Current Every Day Smoker  . Smokeless tobacco: None     Morales: uses an electronic cigarette  . Alcohol Use: Yes  . Drug Use: No  . Sexual Activity: Yes    Birth Control/ Protection: None     Morales: 1 partner in last 12 months   Other Topics Concern  . None   Social History Narrative  . None   Family History  Problem Relation Age of Onset  . Hypertension Mother   . Hypertension Father   . Diabetes Father    No Known Allergies Prior to Admission medications   Medication Sig Start Date End Date Taking? Authorizing Provider  Ascorbic Acid (VITAMIN C) 1000 MG tablet Take 1,000 mg by mouth daily.   Yes Historical Provider, MD  cholecalciferol (VITAMIN D) 1000 UNITS tablet Take 2,000 Units by mouth daily.   Yes Historical Provider, MD  fluticasone (FLONASE) 50 MCG/ACT nasal spray Place 2 sprays  into both nostrils daily. 01/07/13  Yes Bradley Russian, MD  lisinopril (PRINIVIL,ZESTRIL) 5 MG tablet Take 1 tablet (5 mg total) by mouth daily. 07/11/12  Yes Bradley Koyanagi, MD  traMADol (ULTRAM) 50 MG tablet TAKE 1 TABLET BY MOUTH EVERY 6 HOURS AS NEEDED FOR PAIN 02/17/13  Yes Bradley Koyanagi, MD  amoxicillin (AMOXIL) 875 MG tablet Take 1 tablet (875 mg total) by mouth 2 (two) times daily. 01/07/13   Bradley Russian, MD  Brewers Yeast TABS Take 1 tablet by mouth daily.     Historical Provider, MD  Nyoka Cowden Tea, Camillia sinensis, (GREEN TEA PO) Take 1 capsule by mouth daily.    Historical Provider, MD  HYDROcodone-homatropine (HYCODAN) 5-1.5 MG/5ML syrup Take 5 mLs by mouth every 8 (eight) hours as needed for cough. 01/07/13   Bradley Russian, MD     ROS: The patient denies fevers, chills, night sweats, unintentional weight loss, chest pain, palpitations, wheezing, dyspnea on exertion, nausea, vomiting, abdominal pain, dysuria, hematuria, melena, numbness, weakness, .  + pain   All other systems have been reviewed and were otherwise negative with the exception of those mentioned in the HPI and as above.  PHYSICAL EXAM: Filed Vitals:   02/25/13 0839  BP: 130/80  Pulse: 86  Temp: 98 F (36.7 C)  Resp: 18   Filed Vitals:   02/25/13 0839  Height: 5' 10.25" (1.784 m)  Weight: 307 lb (139.254 kg)   Body mass index is 43.75 kg/(m^2).  General: Alert, no acute distress HEENT:  Normocephalic, atraumatic, oropharynx patent. EOMI, PERRLA Cardiovascular:  Regular rate and rhythm, no rubs murmurs or gallops.  No Carotid bruits, radial pulse intact. No pedal edema.  Respiratory: Clear to auscultation bilaterally.  No wheezes, rales, or rhonchi.  No cyanosis, no use of accessory musculature GI: No organomegaly, abdomen is soft and non-tender, positive bowel sounds.  No masses. Skin: No rashes. Neurologic: Facial musculature asymmetric. Right sided Bells Palsy Psychiatric: Patient is  appropriate throughout our interaction. Lymphatic: No cervical lymphadenopathy Musculoskeletal: Gait intact. paramsk non tenderness  Full ROM 5/5 strength, 2/2 DTRs No saddle anesthesia Straight leg positive    LABS: Results for orders placed in visit on 01/07/13  POCT RAPID STREP A (OFFICE)      Result Value Range   Rapid Strep A Screen Negative  Negative     EKG/XRAY:   Primary read interpreted by Bradley Morales at [facility].   ASSESSMENT/PLAN: Encounter Diagnoses  Name Primary?  . Lumbago Yes  . Radicular low back pain   . Sciatica    Rx Flexeril He does not want anything stronger, he recently got Tramadol refills from Dr Bradley Morales Narcotic Profile pulled--nothing abnormal F/u prn   Gross sideeffects, risk and benefits, and alternatives of medications d/w patient. Patient is aware that all medications have potential sideeffects and we are unable to predict every sideeffect or drug-drug interaction that may occur.  LE, [place], DO 02/25/2013 11:09 AM

## 2013-02-27 ENCOUNTER — Encounter: Payer: Self-pay | Admitting: *Deleted

## 2013-02-27 ENCOUNTER — Telehealth: Payer: Self-pay | Admitting: Neurology

## 2013-02-27 NOTE — Telephone Encounter (Signed)
I called and spoke with the patient about his recent sleep study results I informed the patient that the study revealed mild obstructive sleep apnea and CPAP treatment is optional. Dr. Brett Fairy recommends REM suppressant medication for those who are not happy using CPAP. Patient has scheduled a follow up appointment with Dr. Brett Fairy to discuss treatment ( 03-12-13@10 :3). I will fax a copy to Dr. Ninfa Meeker office and I will mail a copy to the patient.

## 2013-03-12 ENCOUNTER — Ambulatory Visit (INDEPENDENT_AMBULATORY_CARE_PROVIDER_SITE_OTHER): Payer: 59 | Admitting: Neurology

## 2013-03-12 ENCOUNTER — Encounter: Payer: Self-pay | Admitting: Neurology

## 2013-03-12 VITALS — BP 130/85 | HR 89 | Ht 71.75 in | Wt 314.0 lb

## 2013-03-12 DIAGNOSIS — R519 Headache, unspecified: Secondary | ICD-10-CM

## 2013-03-12 DIAGNOSIS — E6609 Other obesity due to excess calories: Secondary | ICD-10-CM | POA: Insufficient documentation

## 2013-03-12 DIAGNOSIS — G4733 Obstructive sleep apnea (adult) (pediatric): Secondary | ICD-10-CM

## 2013-03-12 DIAGNOSIS — R51 Headache: Secondary | ICD-10-CM

## 2013-03-12 MED ORDER — AMITRIPTYLINE HCL 10 MG PO TABS: 10.0000 mg | ORAL_TABLET | Freq: Every day | ORAL | Status: DC

## 2013-03-12 NOTE — Progress Notes (Signed)
[facility]  Provider:  Larey Seat, M D  Referring Provider: Leandrew Koyanagi, MD Primary Care Physician:  Leandrew Koyanagi, MD  Chief Complaint  Patient presents with  . Follow-up    room 10  . snoring disorder    HPI:  Bradley Morales is a 33 y.o. male and seen here as a revisit  from Dr. Laney Pastor after a  a sleep study.  Bradley Morales is  a 33 year old, right-handed Caucasian gentleman , who underwent a polysomnography evaluation on 02-16-13 . This PSG was not SPLIT as it  revealed an AHI of only  9.2 and an RDI of 11.5.  In REM sleep his AHI was 27 per hour,  non-REM sleep AHI was 3.8.  The supine position also exacerbated his AHI was 10.3 versus a non-supine AHI of 3.2.  Oxygen desaturation was its lowest at 90% and did not pull subcritical desaturation level.  Heart rate stayed in normal sinus rhythm. The patient did not retain CO2.  Based on this study,  I felt that CPAP is an option but should not be a mandatory treatment.   The patient knows about the higher risk of Obstructive sleep apnea related to his high BMI and he certainly is interested in losing weight to sustain a healthier nocturnal breathing pattern.  He prefers prone or lateral sleep position over the supine sleep position,  he stated. He will implement the tennis ball methods to avoid supine sleep inadvertently. I do certainly feel that poor sleep benefit from. Given that the AHI is 4 or 54 non-REM sleep in nonsupine sleep should be sufficient treatment. I have offered a REM suppressant for this patient, or already takes Flexeril at night. He denies any sleepiness and daytime interfering with his ability to drive.  He again endorsed  today the Epworth sleepiness score at one point, and the fatigue severity score at 33 points. He has not used Flexaril regularly.    Last consult note:  The [facility] an [facility]. 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 He is followed by the D.O T. recommendations. The patient has a history of obesity and  variable blood pressure.  About 6 years ago he was evaluated for sleep apnea after he developed chest pains, was evaluated  at the [facility] heart and sleep lab , after wards he was contacted to pick up a CPAP machine - He concluded he had no follow up since Huey Romans).  Sleep habits extremity normal : Bedroom is cool, quiet and dark.   Bedtime 9.30 PM,  he will promptly fall asleep. He will pretty regularly wake up at 2 in the morning and has no external  reason for it, he will have one bathroom break at  2 AM, but his  bathroom call oesn't wake him. He has to rise at 5:30 in the morning to work. He seems to be mostly spontaneously up and awake before his alarm rings.  He does not use any coffee in the morning. Drinks water during the day mostly,  sometimes a soda, does not longer exceed one every day.  He will have toast in the morning,  he often skips lunch, dinner is her regular family affair and his largest meal of the day.  The patient shares a bedroom with his wife. His wife reports that he is a sleep talker, restless, kicking or boxing has  not been noticed. The patient reports that it doesn't seem to make a difference if he uses CPAP are not in that he is not salt refreshed not in the morning. He went one year without using it and didn't see a difference at all. Meanwhile in comparison to a study 6 years ago he has gained some additional weight and this is likely a sign that he may half apnea perhaps even to a higher degree. He does remember that he was told initially that his sleep apnea was borderline and that treatment was optional.  Review of Systems: Out of a complete 14 system review, the patient complains of only the following symptoms, and all other reviewed systems are negative. He endorsed today the upper sleepiness score at 1 single point and the fatigue  severity score 18 points. No recent cardiac symptoms chest pain and no depression.   He has been a sleepy person since adulthood, no cataplexy or sleep paralysis, his father has witnessed apneas but was never tested. No other sleep disorders.      History   Social History  . Marital Status: Married    Spouse Name: Melissa    Number of Children: 1  . Years of Education: 12   Occupational History  . 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 Smoking status: Former Smoker    Quit date: 02/19/2012  . Smokeless tobacco: Not on file     Comment: uses an electronic cigarette  . Alcohol Use: Yes  . Drug Use: No  . Sexual Activity: Yes    Birth Control/ Protection: None     Comment: 1 partner in last 12 months   Other Topics Concern  . Not on file   Social History Narrative   Patient is married Programme researcher, broadcasting/film/video) and lives at home with his family.   Patient has one child.   Patient is working full-time.   Patient has a high school education.   Patient is right-handed.   Patient drinks 3 cups of soda per day.    Family History  Problem Relation Age of Onset  . Hypertension Mother   . Hypertension Father   . Diabetes Father     Past Medical History  Diagnosis Date  . Hypertension   . Bell's palsy   . Snoring disorder   . OSA (obstructive sleep apnea)   . Obesity due to excess calories     Past Surgical History  Procedure Laterality Date  . Vasectomy      Current Outpatient Prescriptions  Medication Sig Dispense Refill  . Ascorbic Acid (VITAMIN C) 1000 MG tablet Take 1,000 mg by mouth daily.      . cholecalciferol (VITAMIN D) 1000 UNITS tablet Take 2,000 Units by mouth daily.      . cyclobenzaprine (FLEXERIL) 10 MG tablet Take 1 tablet (10 mg total) by mouth 3 (three) times daily as needed for muscle spasms.  30 tablet  2  . GARCINIA CAMBOGIA-CHROMIUM PO Take 3 tablets by mouth daily.      Marland Kitchen lisinopril  (PRINIVIL,ZESTRIL) 5 MG tablet Take 1 tablet (5 mg total) by mouth daily.  90 tablet  3  . amitriptyline (ELAVIL) 10 MG tablet Take 1 tablet (10 mg total) by mouth at bedtime.  30 tablet  3   No current facility-administered medications for this visit.    Allergies as of 03/12/2013  . (No Known Allergies)    Vitals: BP 130/85  Pulse 89  Ht 5' 11.75" (1.822 m)  Wt 314 lb (142.429 kg)  BMI 42.90 kg/m2 Last Weight:  Wt Readings from Last 1 Encounters:  03/12/13 314 lb (142.429 kg)   Last Height:   Ht Readings from Last 1 Encounters:  03/12/13 5' 11.75" (1.822 m)     Physical exam:  General: The patient is awake, alert and appears not in acute distress. The patient is well groomed. Head: Normocephalic, atraumatic. Neck is supple. Mallampati 1 , neck circumference: 17, no retrognathia.  Cardiovascular:  Regular rate and rhythm , without  murmurs or carotid bruit, and without distended neck veins. Respiratory: Lungs are clear to auscultation. Skin:  Without evidence of edema, or rash Trunk: BMI is elevated , normal posture.  Neurologic exam : The patient is awake and alert, oriented to place and time.  Memory subjective   described as intact.  There is a normal attention span & concentration ability. Speech is fluent without   dysarthria, dysphonia or aphasia. Mood and affect are appropriate.  Cranial nerves: Pupils are equal and briskly reactive to light. Funduscopic exam without  evidence of pallor or edema.  Extraocular movements  in vertical and horizontal planes intact and without nystagmus. Visual fields by finger perimetry are intact. Hearing to finger rub intact.  Facial sensation intact to fine touch. Facial motor strength - patient has a right bell's palsy , a residual since he was in 8th grade. His tongue and uvula move midline.  Motor exam:    Normal tone and  muscle bulk and symmetric strength in all extremities.  Sensory:  Fine touch, pinprick and vibration were  tested in all extremities. Proprioception is normal.  Coordination: Rapid alternating movements in the fingers/hands is tested and normal. Finger-to-nose maneuver without evidence of ataxia, dysmetria or tremor.  Gait and station: Patient walks without assistive device .  Deep tendon reflexes: in the  upper and lower extremities are symmetric and intact. Babinski maneuver response is downgoing.   Assessment:  After physical and neurologic examination, review of laboratory studies, imaging, neurophysiology testing and pre-existing records, assessment is  1)  Bell's palsy right face in 8th grade , left face in 2013.  2) Sarcoid, sjoegren's, ANA were possibly tested , but I cannot see these records.  3) normal upper airway, mallompotti 1 ,  Surprising as he is a loud snorer in the supine position , less loud on the side. He likes to sleep prone.  4)  He has almost chronic nasal congestion.   Plan:  Treatment plan and additional workup : nasonex or rhinocort. He will avoid supine sleep position with the tennisball methode. He was given a prescription for amitriptyline instead of Flexaril. .  Weight loss advise - evaluate the 2 and 5 diet . Dr. Berline Chough

## 2013-04-27 ENCOUNTER — Other Ambulatory Visit: Payer: Self-pay | Admitting: Internal Medicine

## 2013-05-01 NOTE — Telephone Encounter (Signed)
Faxed

## 2013-05-29 ENCOUNTER — Other Ambulatory Visit: Payer: Self-pay | Admitting: Internal Medicine

## 2013-05-30 NOTE — Telephone Encounter (Signed)
Called in.

## 2013-06-05 ENCOUNTER — Encounter: Payer: Self-pay | Admitting: Internal Medicine

## 2013-06-05 ENCOUNTER — Ambulatory Visit (INDEPENDENT_AMBULATORY_CARE_PROVIDER_SITE_OTHER): Payer: 59 | Admitting: Internal Medicine

## 2013-06-05 VITALS — BP 146/80 | HR 80 | Ht 70.0 in | Wt 308.0 lb

## 2013-06-05 DIAGNOSIS — R002 Palpitations: Secondary | ICD-10-CM

## 2013-06-05 NOTE — Patient Instructions (Signed)
Your physician recommends that you continue on your current medications as directed. Please refer to the Current Medication list given to you today.  Follow up as needed with Dr. Klein.  

## 2013-06-05 NOTE — Progress Notes (Signed)
      Patient Care Team: Leandrew Koyanagi, MD as PCP - General (Family Medicine)   HPI  Bradley Morales is a 33 y.o. male Seen in followup for palpitations.  He snores   sleep apnea testing was undertaken with an AHI 27 with REM sleep and overall 9.2. CPAP was recommended but not as an obligatory thing  He has episodic atypical left upper chest discomfort. In his mind is related to musculoskeletal discomfort Past Medical History  Diagnosis Date  . Hypertension   . Bell's palsy   . Snoring disorder   . OSA (obstructive sleep apnea)   . Obesity due to excess calories     Past Surgical History  Procedure Laterality Date  . Vasectomy      Current Outpatient Prescriptions  Medication Sig Dispense Refill  . amitriptyline (ELAVIL) 10 MG tablet Take 1 tablet (10 mg total) by mouth at bedtime.  30 tablet  3  . Ascorbic Acid (VITAMIN C) 1000 MG tablet Take 1,000 mg by mouth daily.      . cyclobenzaprine (FLEXERIL) 10 MG tablet Take 1 tablet (10 mg total) by mouth 3 (three) times daily as needed for muscle spasms.  30 tablet  2  . GARCINIA CAMBOGIA-CHROMIUM PO Take 3 tablets by mouth daily.      Marland Kitchen lisinopril (PRINIVIL,ZESTRIL) 5 MG tablet Take 1 tablet (5 mg total) by mouth daily.  90 tablet  3  . traMADol (ULTRAM) 50 MG tablet TAKE 1 TABLET BY MOUTH EVERY 6 HOURS AS NEEDED FOR PAIN  30 tablet  0   No current facility-administered medications for this visit.    No Known Allergies  Review of Systems negative except from HPI and PMH  Physical Exam BP 146/80  Pulse 80  Ht 5\' 10"  (1.778 m)  Wt 308 lb (139.708 kg)  BMI 44.19 kg/m2 Well developed and well nourished in no acute distress HENT normal E scleral and icterus clear Neck Supple JVP flat; carotids brisk and full Clear to ausculation  Regular rate and rhythm, no murmurs gallops or rub Soft with active bowel sounds No clubbing cyanosis  Edema Alert and oriented, grossly normal motor and sensory function Skin Warm and  Dry  ECg  Normla\  79 14/08/34  Assessment and  Plan   Hypertension  Reasonably controlled  Sleep apnea  Encourage weight loss  Palpitations  quiescient  Chest pain  Atypical non cardiac

## 2013-07-03 ENCOUNTER — Other Ambulatory Visit: Payer: Self-pay | Admitting: Internal Medicine

## 2013-07-05 NOTE — Telephone Encounter (Signed)
Called in.

## 2013-07-23 ENCOUNTER — Other Ambulatory Visit: Payer: Self-pay | Admitting: Internal Medicine

## 2013-07-31 ENCOUNTER — Other Ambulatory Visit: Payer: Self-pay | Admitting: Internal Medicine

## 2013-08-01 NOTE — Telephone Encounter (Signed)
Faxed

## 2013-08-26 ENCOUNTER — Other Ambulatory Visit: Payer: Self-pay | Admitting: Internal Medicine

## 2013-08-26 NOTE — Telephone Encounter (Signed)
Phone call to patient. He has requested Lisinopril. Advised him he is due for his 6 mo follow up for BP, he states he did not think he needed to come in until his physical in Dec. Advised him we can send in 2 week (per protocol) supply until he can come in. He voiced understanding and states he will try to come in.

## 2014-01-02 ENCOUNTER — Encounter: Payer: Self-pay | Admitting: Internal Medicine

## 2014-01-02 ENCOUNTER — Ambulatory Visit (INDEPENDENT_AMBULATORY_CARE_PROVIDER_SITE_OTHER): Payer: 59 | Admitting: Internal Medicine

## 2014-01-02 VITALS — BP 139/82 | HR 69 | Temp 98.2°F | Resp 18 | Ht 70.25 in | Wt 312.8 lb

## 2014-01-02 DIAGNOSIS — Z0000 Encounter for general adult medical examination without abnormal findings: Secondary | ICD-10-CM

## 2014-01-02 DIAGNOSIS — Z1329 Encounter for screening for other suspected endocrine disorder: Secondary | ICD-10-CM

## 2014-01-02 DIAGNOSIS — E782 Mixed hyperlipidemia: Secondary | ICD-10-CM

## 2014-01-02 DIAGNOSIS — Z130 Encounter for screening for diseases of the blood and blood-forming organs and certain disorders involving the immune mechanism: Secondary | ICD-10-CM

## 2014-01-02 DIAGNOSIS — Z6841 Body Mass Index (BMI) 40.0 and over, adult: Secondary | ICD-10-CM

## 2014-01-02 DIAGNOSIS — I10 Essential (primary) hypertension: Secondary | ICD-10-CM

## 2014-01-02 LAB — POCT UA - MICROSCOPIC ONLY
BACTERIA, U MICROSCOPIC: NEGATIVE
CRYSTALS, UR, HPF, POC: NEGATIVE
Casts, Ur, LPF, POC: NEGATIVE
Epithelial cells, urine per micros: NEGATIVE
Mucus, UA: NEGATIVE
RBC, urine, microscopic: NEGATIVE
WBC, Ur, HPF, POC: NEGATIVE
Yeast, UA: NEGATIVE

## 2014-01-02 LAB — CBC
HEMATOCRIT: 44.4 % (ref 39.0–52.0)
HEMOGLOBIN: 15.2 g/dL (ref 13.0–17.0)
MCH: 28.1 pg (ref 26.0–34.0)
MCHC: 34.2 g/dL (ref 30.0–36.0)
MCV: 82.1 fL (ref 78.0–100.0)
MPV: 10.7 fL (ref 9.4–12.4)
Platelets: 238 10*3/uL (ref 150–400)
RBC: 5.41 MIL/uL (ref 4.22–5.81)
RDW: 13.2 % (ref 11.5–15.5)
WBC: 7.6 10*3/uL (ref 4.0–10.5)

## 2014-01-02 LAB — POCT URINALYSIS DIPSTICK
Bilirubin, UA: NEGATIVE
Glucose, UA: NEGATIVE
Ketones, UA: NEGATIVE
Leukocytes, UA: NEGATIVE
Nitrite, UA: NEGATIVE
PH UA: 7
PROTEIN UA: NEGATIVE
Urobilinogen, UA: 0.2

## 2014-01-02 MED ORDER — TRAMADOL HCL 50 MG PO TABS: 50.0000 mg | ORAL_TABLET | Freq: Four times a day (QID) | ORAL | Status: DC | PRN

## 2014-01-02 MED ORDER — LISINOPRIL 5 MG PO TABS: 5.0000 mg | ORAL_TABLET | Freq: Every day | ORAL | Status: DC

## 2014-01-02 NOTE — Progress Notes (Deleted)
   Subjective:    Patient ID: Bradley Morales, male    DOB: May 03, 1980, 33 y.o.   MRN: [id number]  HPI    Review of Systems  Constitutional: Negative.   HENT: Negative.   Eyes: Negative.   Respiratory: Negative.   Cardiovascular: Negative.   Gastrointestinal: Negative.   Endocrine: Negative.   Genitourinary: Negative.   Musculoskeletal: Negative.   Skin: Negative.   Allergic/Immunologic: Negative.   Neurological: Negative.   Hematological: Negative.   Psychiatric/Behavioral: Negative.        Objective:   Physical Exam        Assessment & Plan:

## 2014-01-02 NOTE — Patient Instructions (Addendum)
Keeping you healthy  Get these tests  Blood pressure- Have your blood pressure checked once a year by your healthcare provider.  Normal blood pressure is 120/80.  Weight- Have your body mass index (BMI) calculated to screen for obesity.  BMI is a measure of body fat based on height and weight. You can also calculate your own BMI at [employer]velBags.it.  Cholesterol- Have your cholesterol checked regularly starting at age 31, sooner may be necessary if you have diabetes, high blood pressure, if a family member developed heart diseases at an early age or if you smoke.   Chlamydia, HIV, and other sexual transmitted disease- Get screened each year until the age of 35 then within three months of each new sexual partner.  Diabetes- Have your blood sugar checked regularly if you have high blood pressure, high cholesterol, a family history of diabetes or if you are overweight.  Get these vaccines  Flu shot- Every fall.  Tetanus shot- Every 10 years.  Menactra- Single dose; prevents meningitis.  Take these steps  Don't smoke- If you do smoke, ask your healthcare provider about quitting. For tips on how to quit, go to www.smokefree.gov or call 1-800-QUIT-NOW.  Be physically active- Exercise 5 days a week for at least 30 minutes.  If you are not already physically active start slow and gradually work up to 30 minutes of moderate physical activity.  Examples of moderate activity include walking briskly, mowing the yard, dancing, swimming bicycling, etc.  Eat a healthy diet- Eat a variety of healthy foods such as fruits, vegetables, low fat milk, low fat cheese, yogurt, lean meats, poultry, fish, beans, tofu, etc.  For more information on healthy eating, go to www.thenutritionsource.org  Drink alcohol in moderation- Limit alcohol intake two drinks or less a day.  Never drink and drive.  Dentist- Brush and floss teeth twice daily; visit your dentis twice a year.  Depression-Your emotional  health is as important as your physical health.  If you're feeling down, losing interest in things you normally enjoy please talk with your healthcare provider.  Gun Safety- If you keep a gun in your home, keep it unloaded and with the safety lock on.  Bullets should be stored separately.  Helmet use- Always wear a helmet when riding a motorcycle, bicycle, rollerblading or skateboarding.  Safe sex- If you may be exposed to a sexually transmitted infection, use a condom  Seat belts- Seat bels can save your life; always wear one.  Smoke/Carbon Monoxide detectors- These detectors need to be installed on the appropriate level of your home.  Replace batteries at least once a year.  Skin Cancer- When out in the sun, cover up and use sunscreen SPF 15 or higher.  Violence- If anyone is threatening or hurting you, please tell your healthcare provider.   Morning back routine: 1)leg lift sitting up on edge of bed-count 25x2 on each side 2)leg on bed L then R to 25 each side twice 3)forward bend hanging for count to 25

## 2014-01-02 NOTE — Progress Notes (Signed)
MRN: [id number] DOB: 03-02-1980  Subjective:   Bradley Morales is a 33 y.o. male presenting for annual physical exam.  He currently has no concerns or complaints at this time.    Diet: Contributes weight gain to late night eating, then going to sleep directly after.  290 with using the cigarettes, but when he switched to e-cigarettes, the weight went up.  He has no eating restrictions.  Vegetables are not always included in his daily diet.  He intermittently takes the garcinia cambogia for weight loss, but states that this has not helped.    Exercise: He is not exercising due to lack of time--works long hours, according to patient.  No chest pain, palpitaitons, or leg swellings--uses compression stockings to right leg.  No complaints bilaterally   Last eye exam was 1.5 years.  Wears glasses at night.  Denies any change in vision.  Bradley has a current medication list which includes the following prescription(s): vitamin c, garcinia cambogia-chromium, lisinopril, tramadol, amitriptyline, and cyclobenzaprine.  He has No Known Allergies.  Bradley  has a past medical history of Hypertension; Bell's palsy; Snoring disorder; OSA (obstructive sleep apnea); and Obesity due to excess calories. Also  has past surgical history that includes Vasectomy.  ROS As in subjective. Review of Systems  Constitutional: Negative.  HENT: Negative.  Eyes: Negative.  Respiratory: Negative.  Cardiovascular: Negative.  Gastrointestinal: Negative.  Endocrine: Negative.  Genitourinary: Negative.  Musculoskeletal: Negative.  Skin: Negative.  Allergic/Immunologic: Negative.  Neurological: Negative.  Hematological: Negative.  Psychiatric/Behavioral: Negative.  No longer using CPAP--ok  Objective:   Vitals: BP 139/82 mmHg  Pulse 69  Temp(Src) 98.2 F (36.8 C) (Oral)  Resp 18  Ht 5' 10.25" (1.784 m)  Wt 312 lb 12.8 oz (141.885 kg)  BMI 44.58 kg/m2  SpO2 99%  Physical Exam  Constitutional: He is oriented  to person, place, and time and well-developed, well-nourished, and in no distress. No distress.  HENT:  Head: Normocephalic and atraumatic.  Eyes: Conjunctivae are normal. Pupils are equal, round, and reactive to light. Right eye exhibits no discharge. Left eye exhibits no discharge.  No abnormalities detected fundoscopically.  Neck: Normal range of motion. Neck supple. No tracheal deviation present. No thyromegaly present.  Cardiovascular: Normal rate, regular rhythm, normal heart sounds and intact distal pulses.  Exam reveals no gallop and no friction rub.   No murmur heard. Pulmonary/Chest: Effort normal and breath sounds normal. No respiratory distress. He has no wheezes.  Abdominal: Soft. Bowel sounds are normal. He exhibits no distension. There is no tenderness.  Musculoskeletal: He exhibits no edema.  Neurological: He is alert and oriented to person, place, and time. He has a normal Romberg Test. Gait normal. Coordination and gait normal.  Facial nerve deficit--weak facial movement with smiling, puffing cheeks, and eyebrow movement.  (chronic condition with hx of bell's palsy).    Skin: Skin is warm, dry and intact.  Psychiatric: Mood, memory, affect and judgment normal.   Wt Readings from Last 3 Encounters:  01/02/14 312 lb 12.8 oz (141.885 kg)  06/05/13 308 lb (139.708 kg)  03/12/13 314 lb (142.429 kg)    No results found for this or any previous visit (from the past 24 hour(s)).   Assessment and Plan :  33 year old male with PMH of HTN is here today for a complete physical exam.  Annual physical exam CBC, Lipid panel, POCT UA - Microscopic Only, POCT urinalysis dipstick, TSH, COMPLETE METABOLIC PANEL WITH GFR  Essential  hypertension  POCT UA - Microscopic Only, POCT urinalysis dipstick, COMPLETE METABOLIC PANEL WITH GFR, lisinopril (PRINIVIL,ZESTRIL) 5 MG tablet  BMI 45.0-49.9, adult Advised patient to discontinue eating so late, and to eat fresh vege snacks.   Mixed  hyperlipidemia Lipid panel  Screening for thyroid disorder  TSH  Screening for deficiency anemia  CBC  Recurrent back pain-lumbar Gave exercises for daily use in am  Ivar Drape, PA-C assisted in care of this patient RDDoolittle MD [facility] 12/16/20153:31 PM

## 2014-01-03 LAB — COMPLETE METABOLIC PANEL WITH GFR
ALBUMIN: 4.5 g/dL (ref 3.5–5.2)
ALK PHOS: 61 U/L (ref 39–117)
ALT: 21 U/L (ref 0–53)
AST: 18 U/L (ref 0–37)
BUN: 10 mg/dL (ref 6–23)
CALCIUM: 9.4 mg/dL (ref 8.4–10.5)
CHLORIDE: 103 meq/L (ref 96–112)
CO2: 27 mEq/L (ref 19–32)
Creat: 0.93 mg/dL (ref 0.50–1.35)
GFR, Est African American: >89 mL/min
GFR, Est Non African American: >89 mL/min
GLUCOSE: 80 mg/dL (ref 70–99)
POTASSIUM: 4 meq/L (ref 3.5–5.3)
SODIUM: 142 meq/L (ref 135–145)
TOTAL PROTEIN: 7.2 g/dL (ref 6.0–8.3)
Total Bilirubin: 0.7 mg/dL (ref 0.2–1.2)

## 2014-01-03 LAB — LIPID PANEL
CHOL/HDL RATIO: 3.8 ratio
CHOLESTEROL: 129 mg/dL (ref 0–200)
HDL: 34 mg/dL — ABNORMAL LOW (ref >39)
LDL Cholesterol: 68 mg/dL (ref 0–99)
TRIGLYCERIDES: 136 mg/dL (ref <150)
VLDL: 27 mg/dL (ref 0–40)

## 2014-01-03 LAB — TSH: TSH: 0.379 u[IU]/mL (ref 0.350–4.500)

## 2014-01-10 ENCOUNTER — Encounter: Payer: Self-pay | Admitting: Internal Medicine

## 2014-02-17 IMAGING — CR DG CHEST 2V
2 series · 2 of 2 positions shown · non-contrast
Comparison: The PA and lateral chest 11/24/2011.

CLINICAL DATA: Chest pain.

CHEST - 2 VIEW

[w chest pa]
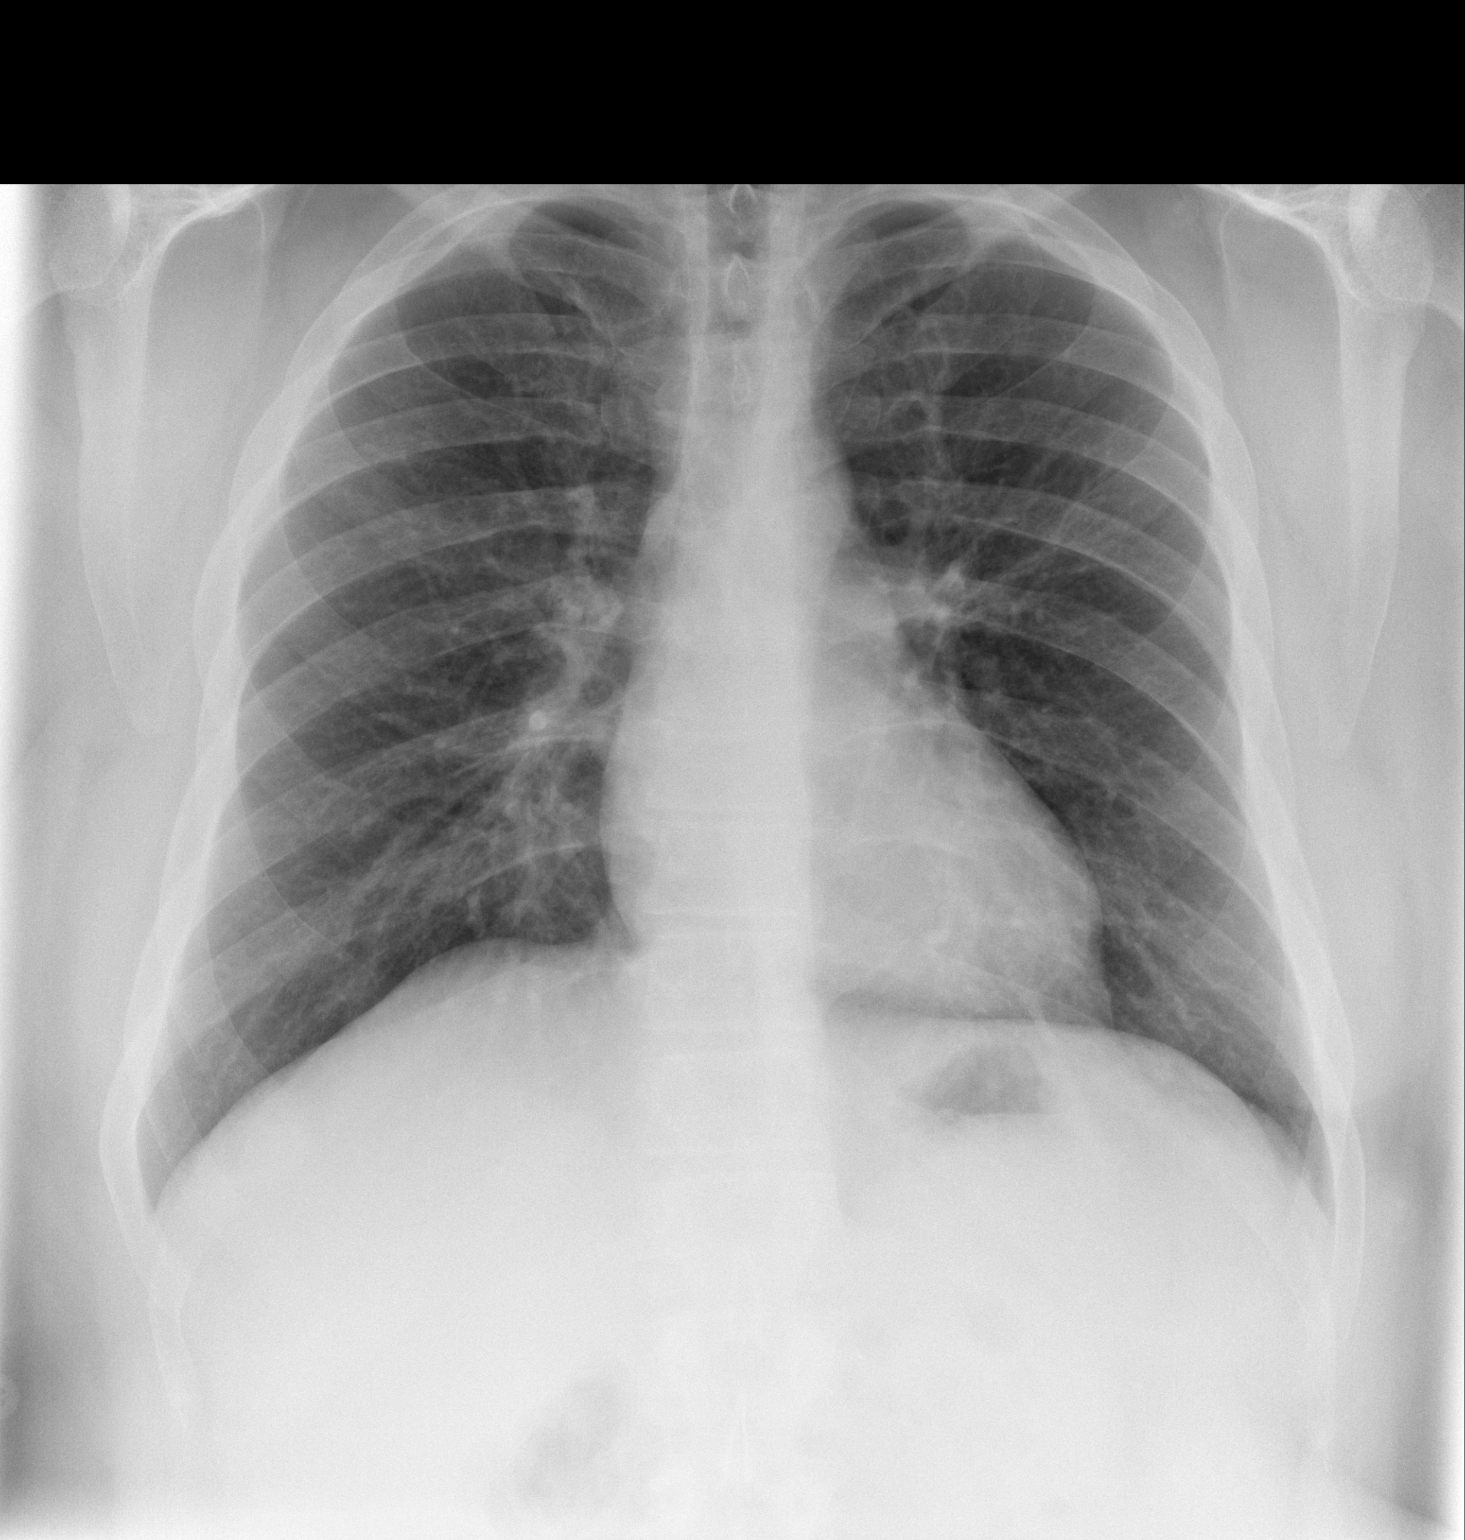

[w chest lat]
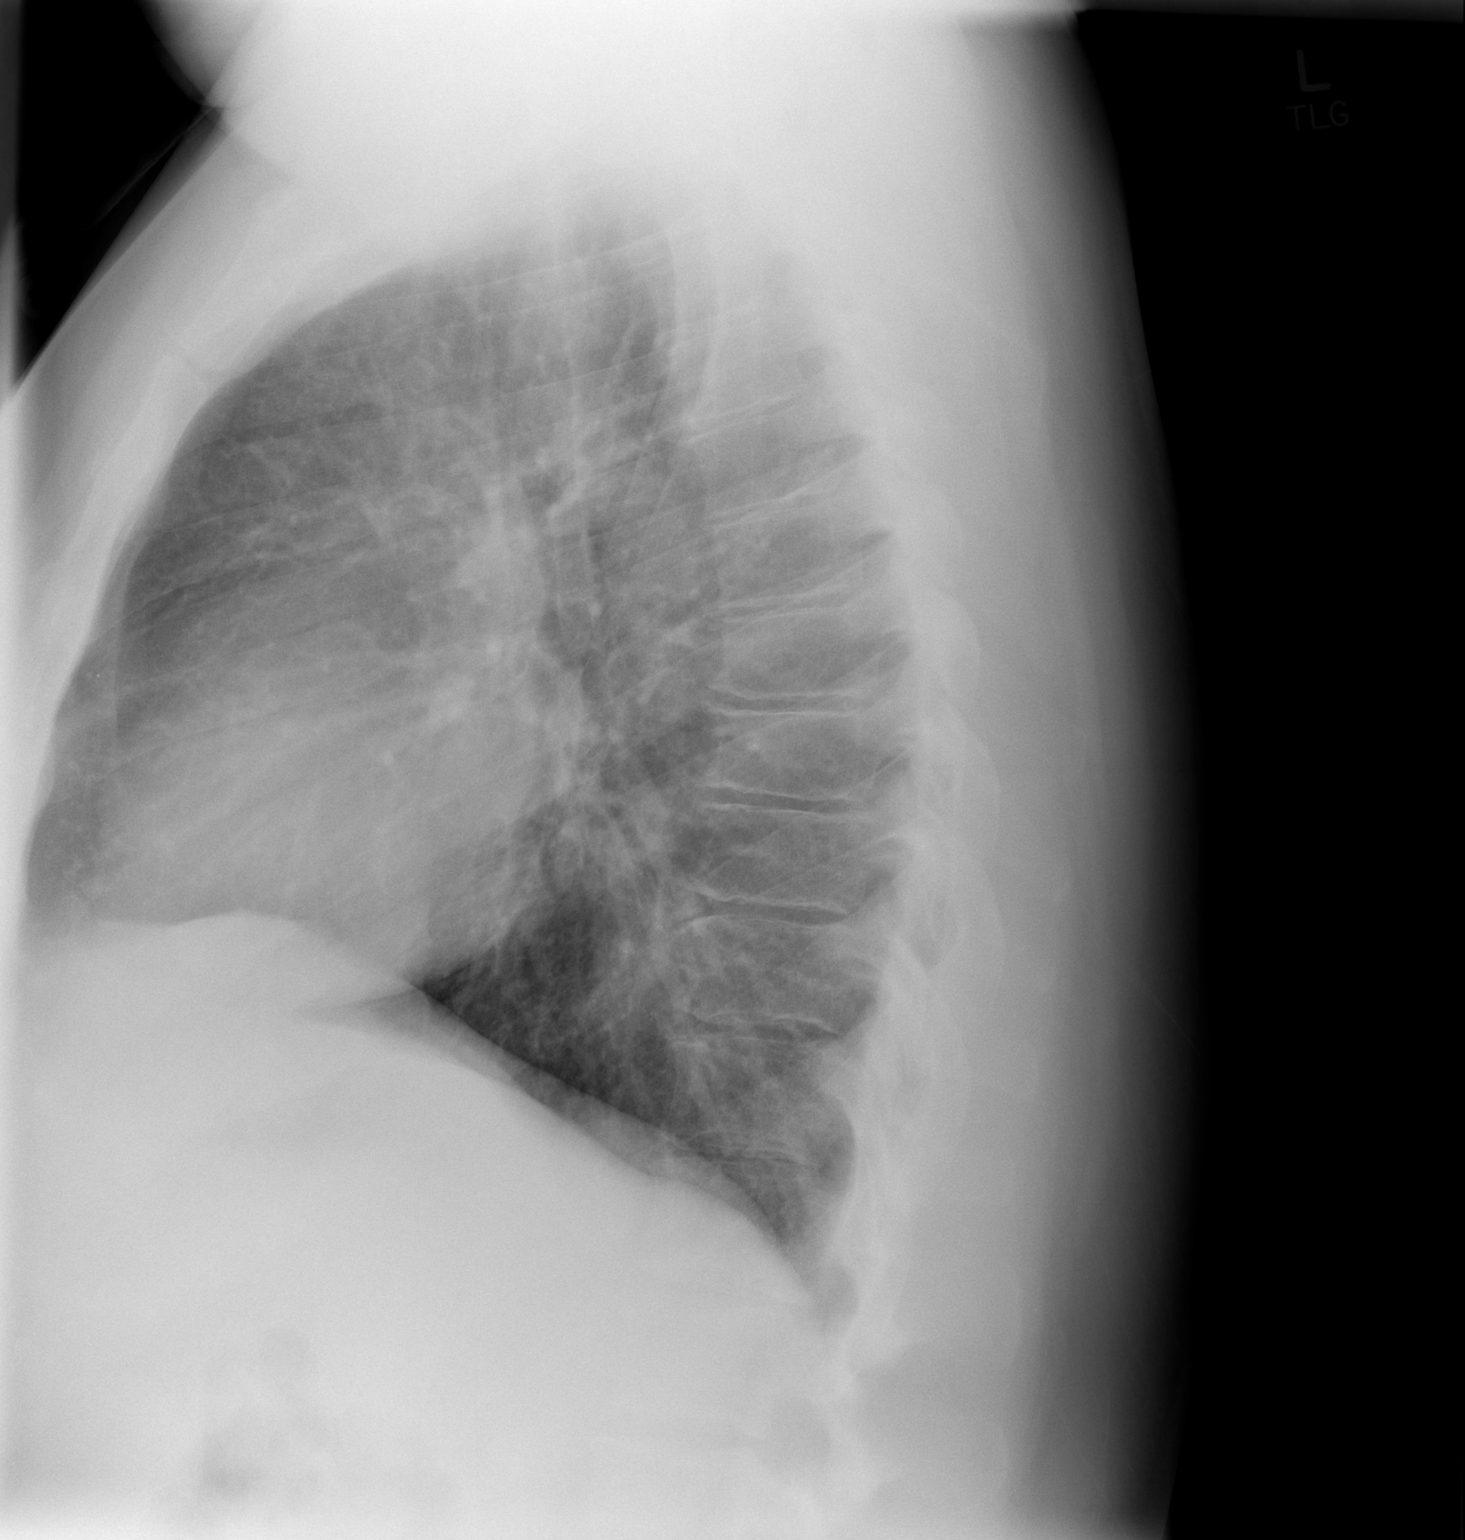

[2 of 2 positions shown; findings below may reference images not displayed]

FINDINGS: Lungs are clear.  Heart size is normal.  No pneumothorax
or pleural effusion.
IMPRESSION: Negative chest.

## 2014-06-11 ENCOUNTER — Ambulatory Visit (INDEPENDENT_AMBULATORY_CARE_PROVIDER_SITE_OTHER): Payer: 59 | Admitting: Internal Medicine

## 2014-06-11 VITALS — BP 124/81 | HR 97 | Temp 97.8°F | Resp 18 | Wt 303.0 lb

## 2014-06-11 DIAGNOSIS — R05 Cough: Secondary | ICD-10-CM

## 2014-06-11 DIAGNOSIS — G933 Postviral fatigue syndrome: Secondary | ICD-10-CM | POA: Diagnosis not present

## 2014-06-11 DIAGNOSIS — G9331 Postviral fatigue syndrome: Secondary | ICD-10-CM

## 2014-06-11 DIAGNOSIS — J200 Acute bronchitis due to Mycoplasma pneumoniae: Secondary | ICD-10-CM | POA: Diagnosis not present

## 2014-06-11 DIAGNOSIS — R059 Cough, unspecified: Secondary | ICD-10-CM

## 2014-06-11 MED ORDER — AZITHROMYCIN 500 MG PO TABS
500.0000 mg | ORAL_TABLET | Freq: Every day | ORAL | Status: DC
Start: 2014-06-11 — End: 2015-01-22

## 2014-06-11 MED ORDER — HYDROCODONE-ACETAMINOPHEN 7.5-325 MG/15ML PO SOLN: 10.0000 mL | Freq: Four times a day (QID) | ORAL | Status: DC | PRN

## 2014-06-11 NOTE — Progress Notes (Signed)
   Subjective:    Patient ID: Bradley Morales, male    DOB: 07-09-1980, 34 y.o.   MRN: [id number]  HPI Smoker, has cough productive, chest congestion, chills and fatigue No cp or sob  Review of Systems     Objective:   Physical Exam  Constitutional: He is oriented to person, place, and time. He appears well-nourished. No distress.  HENT:  Head: Normocephalic.  Right Ear: External ear normal.  Left Ear: External ear normal.  Mouth/Throat: Oropharynx is clear and moist.  Eyes: EOM are normal.  Neck: Normal range of motion.  Cardiovascular: Normal rate, regular rhythm and normal heart sounds.   Pulmonary/Chest: No tachypnea. No respiratory distress. He has decreased breath sounds. He has no wheezes. He has rhonchi. He has no rales.  Lymphadenopathy:    He has no cervical adenopathy.  Neurological: He is alert and oriented to person, place, and time. He exhibits normal muscle tone. Coordination normal.  Psychiatric: He has a normal mood and affect.          Assessment & Plan:  Bronchitis/Fatigue/Smoker Zith/Lortab Quit smoking

## 2014-06-11 NOTE — Patient Instructions (Signed)
Smoking Cessation Quitting smoking is important to your health and has many advantages. However, it is not always easy to quit since nicotine is a very addictive drug. Oftentimes, people try 3 times or more before being able to quit. This document explains the best ways for you to prepare to quit smoking. Quitting takes hard work and a lot of effort, but you can do it. ADVANTAGES OF QUITTING SMOKING  You will live longer, feel better, and live better.  Your body will feel the impact of quitting smoking almost immediately.  Within 20 minutes, blood pressure decreases. Your pulse returns to its normal level.  After 8 hours, carbon monoxide levels in the blood return to normal. Your oxygen level increases.  After 24 hours, the chance of having a heart attack starts to decrease. Your breath, hair, and body stop smelling like smoke.  After 48 hours, damaged nerve endings begin to recover. Your sense of taste and smell improve.  After 72 hours, the body is virtually free of nicotine. Your bronchial tubes relax and breathing becomes easier.  After 2 to 12 weeks, lungs can hold more air. Exercise becomes easier and circulation improves.  The risk of having a heart attack, stroke, cancer, or lung disease is greatly reduced.  After 1 year, the risk of coronary heart disease is cut in half.  After 5 years, the risk of stroke falls to the same as a nonsmoker.  After 10 years, the risk of lung cancer is cut in half and the risk of other cancers decreases significantly.  After 15 years, the risk of coronary heart disease drops, usually to the level of a nonsmoker.  If you are pregnant, quitting smoking will improve your chances of having a healthy baby.  The people you live with, especially any children, will be healthier.  You will have extra money to spend on things other than cigarettes. QUESTIONS TO THINK ABOUT BEFORE ATTEMPTING TO QUIT You may want to talk about your answers with your  health care provider.  Why do you want to quit?  If you tried to quit in the past, what helped and what did not?  What will be the most difficult situations for you after you quit? How will you plan to handle them?  Who can help you through the tough times? Your family? Friends? A health care provider?  What pleasures do you get from smoking? What ways can you still get pleasure if you quit? Here are some questions to ask your health care provider:  How can you help me to be successful at quitting?  What medicine do you think would be best for me and how should I take it?  What should I do if I need more help?  What is smoking withdrawal like? How can I get information on withdrawal? GET READY  Set a quit date.  Change your environment by getting rid of all cigarettes, ashtrays, matches, and lighters in your home, car, or work. Do not let people smoke in your home.  Review your past attempts to quit. Think about what worked and what did not. GET SUPPORT AND ENCOURAGEMENT You have a better chance of being successful if you have help. You can get support in many ways.  Tell your family, friends, and coworkers that you are going to quit and need their support. Ask them not to smoke around you.  Get individual, group, or telephone counseling and support. Programs are available at local hospitals and health centers. Call   your local health department for information about programs in your area.  Spiritual beliefs and practices may help some smokers quit.  Download a "quit meter" on your computer to keep track of quit statistics, such as how long you have gone without smoking, cigarettes not smoked, and money saved.  Get a self-help book about quitting smoking and staying off tobacco. [place] yourself from urges to smoke. Talk to someone, go for a walk, or occupy your time with a task.  Change your normal routine. Take a different route to work.  Drink tea instead of coffee. Eat breakfast in a different place.  Reduce your stress. Take a hot bath, exercise, or read a book.  Plan something enjoyable to do every day. Reward yourself for not smoking.  Explore interactive web-based programs that specialize in helping you quit. GET MEDICINE AND USE IT CORRECTLY Medicines can help you stop smoking and decrease the urge to smoke. Combining medicine with the above behavioral methods and support can greatly increase your chances of successfully quitting smoking.  Nicotine replacement therapy helps deliver nicotine to your body without the negative effects and risks of smoking. Nicotine replacement therapy includes nicotine gum, lozenges, inhalers, nasal sprays, and skin patches. Some may be available over-the-counter and others require a prescription.  Antidepressant medicine helps people abstain from smoking, but how this works is unknown. This medicine is available by prescription.  Nicotinic receptor partial agonist medicine simulates the effect of nicotine in your brain. This medicine is available by prescription. Ask your health care provider for advice about which medicines to use and how to use them based on your health history. Your health care provider will tell you what side effects to look out for if you choose to be on a medicine or therapy. Carefully read the information on the package. Do not use any other product containing nicotine while using a nicotine replacement product.  RELAPSE OR DIFFICULT SITUATIONS Most relapses occur within the first 3 months after quitting. Do not be discouraged if you start smoking again. Remember, most people try several times before finally quitting. You may have symptoms of withdrawal because your body is used to nicotine. You may crave cigarettes, be irritable, feel very hungry, cough often, get headaches, or have difficulty concentrating. The withdrawal symptoms are only temporary. They are strongest  when you first quit, but they will go away within 10-14 days. To reduce the chances of relapse, try to:  Avoid drinking alcohol. Drinking lowers your chances of successfully quitting.  Reduce the amount of caffeine you consume. Once you quit smoking, the amount of caffeine in your body increases and can give you symptoms, such as a rapid heartbeat, sweating, and anxiety.  Avoid smokers because they can make you want to smoke.  Do not let weight gain distract you. Many smokers will gain weight when they quit, usually less than 10 pounds. Eat a healthy diet and stay active. You can always lose the weight gained after you quit.  Find ways to improve your mood other than smoking. FOR MORE INFORMATION  www.smokefree.gov  Document Released: 12/29/2000 Document Revised: 05/21/2013 Document Reviewed: 04/15/2011 [facility] Patient Information 2015 [place], [place]. This information is not intended to replace advice given to you by your health care provider. Make sure you discuss any questions you have with your health care provider. Cough, Adult  A cough is a reflex that helps clear your throat and airways. It can help heal the body or  may be a reaction to an irritated airway. A cough may only last 2 or 3 weeks (acute) or may last more than 8 weeks (chronic).  CAUSES Acute cough:  Viral or bacterial infections. Chronic cough:  Infections.  Allergies.  Asthma.  Post-nasal drip.  Smoking.  Heartburn or acid reflux.  Some medicines.  Chronic lung problems (COPD).  Cancer. SYMPTOMS   Cough.  Fever.  Chest pain.  Increased breathing rate.  High-pitched whistling sound when breathing (wheezing).  Colored mucus that you cough up (sputum). TREATMENT   A bacterial cough may be treated with antibiotic medicine.  A viral cough must run its course and will not respond to antibiotics.  Your caregiver may recommend other treatments if you have a chronic cough. HOME CARE  INSTRUCTIONS   Only take over-the-counter or prescription medicines for pain, discomfort, or fever as directed by your caregiver. Use cough suppressants only as directed by your caregiver.  Use a cold steam vaporizer or humidifier in your bedroom or home to help loosen secretions.  Sleep in a semi-upright position if your cough is worse at night.  Rest as needed.  Stop smoking if you smoke. SEEK IMMEDIATE MEDICAL CARE IF:   You have pus in your sputum.  Your cough starts to worsen.  You cannot control your cough with suppressants and are losing sleep.  You begin coughing up blood.  You have difficulty breathing.  You develop pain which is getting worse or is uncontrolled with medicine.  You have a fever. MAKE SURE YOU:   Understand these instructions.  Will watch your condition.  Will get help right away if you are not doing well or get worse. Document Released: 07/03/2010 Document Revised: 03/29/2011 Document Reviewed: 07/03/2010 [facility] Patient Information 2015 [place], [place]. This information is not intended to replace advice given to you by your health care provider. Make sure you discuss any questions you have with your health care provider.

## 2014-07-26 IMAGING — CR DG HIP (WITH OR WITHOUT PELVIS) 2-3V*L*
4 series · 4 of 4 positions shown · non-contrast
Comparison: None

CLINICAL DATA: Hip pain

LEFT HIP - COMPLETE 2+ VIEW

[AP (1 of 3)]
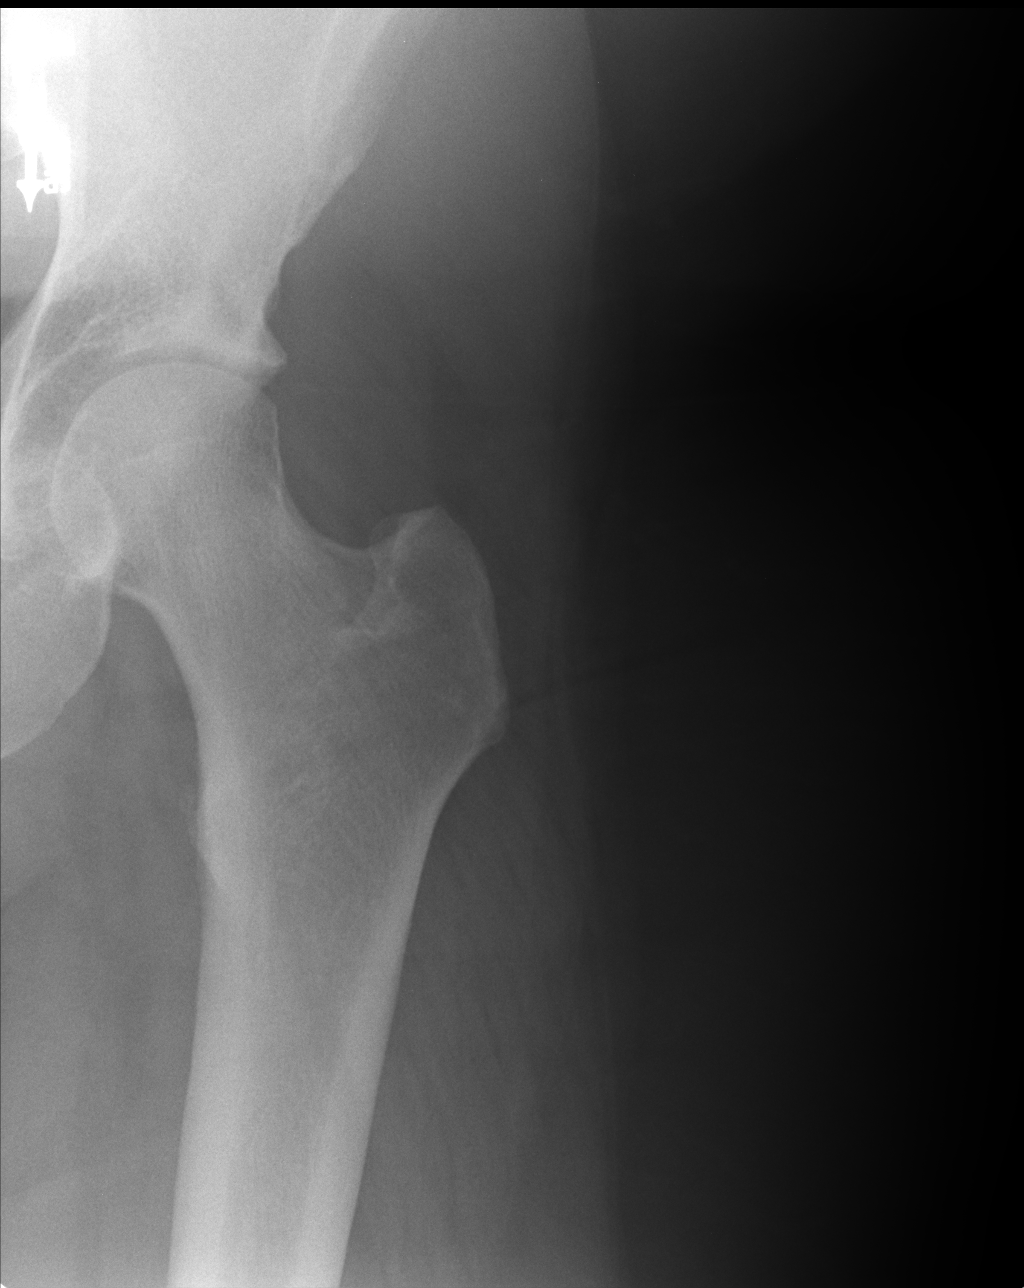

[lateral]
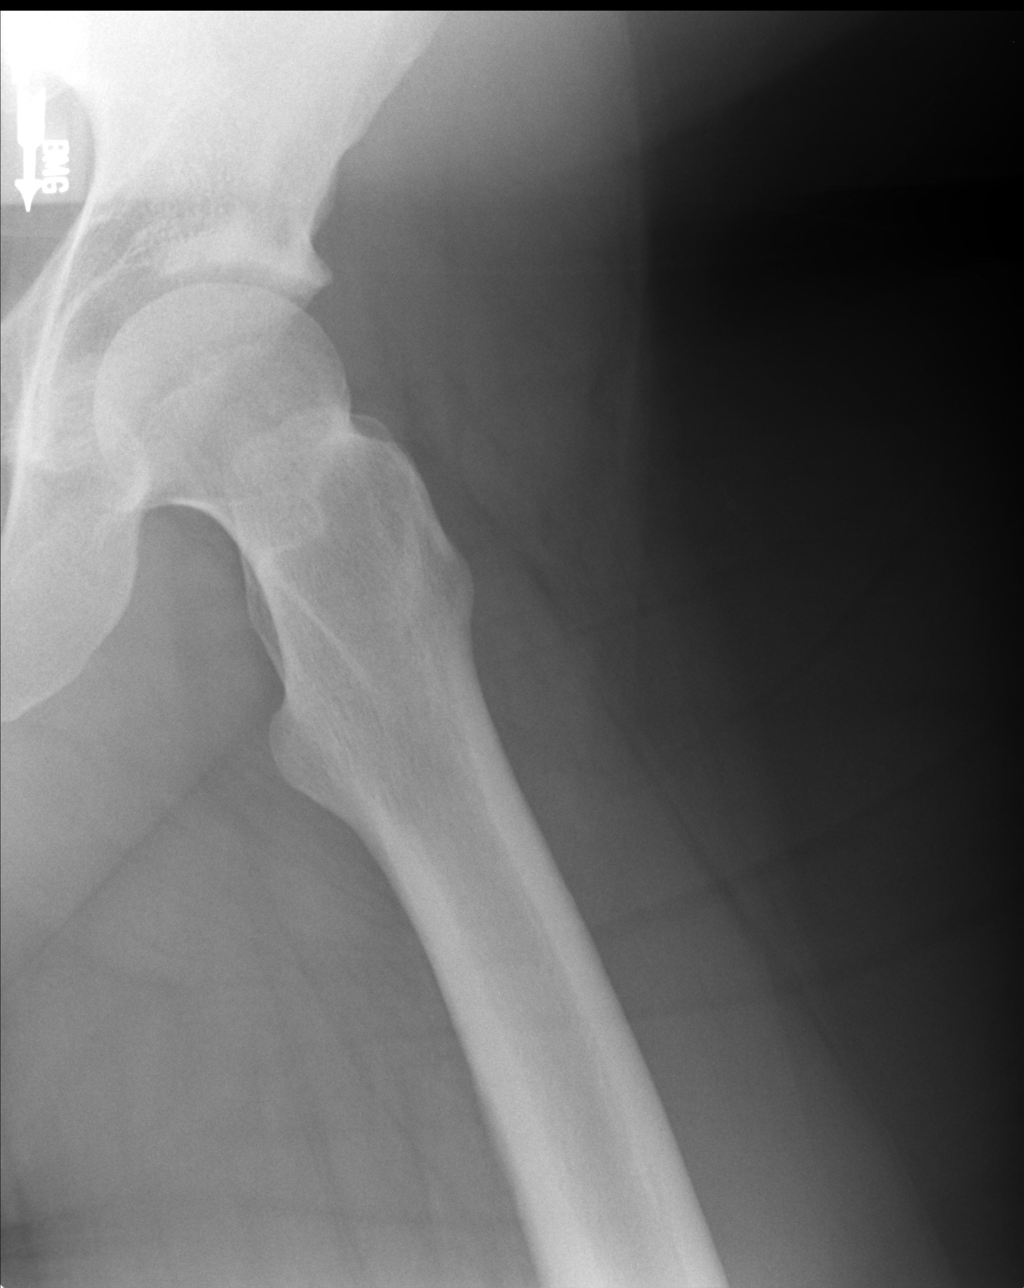

[AP (2 of 3)]
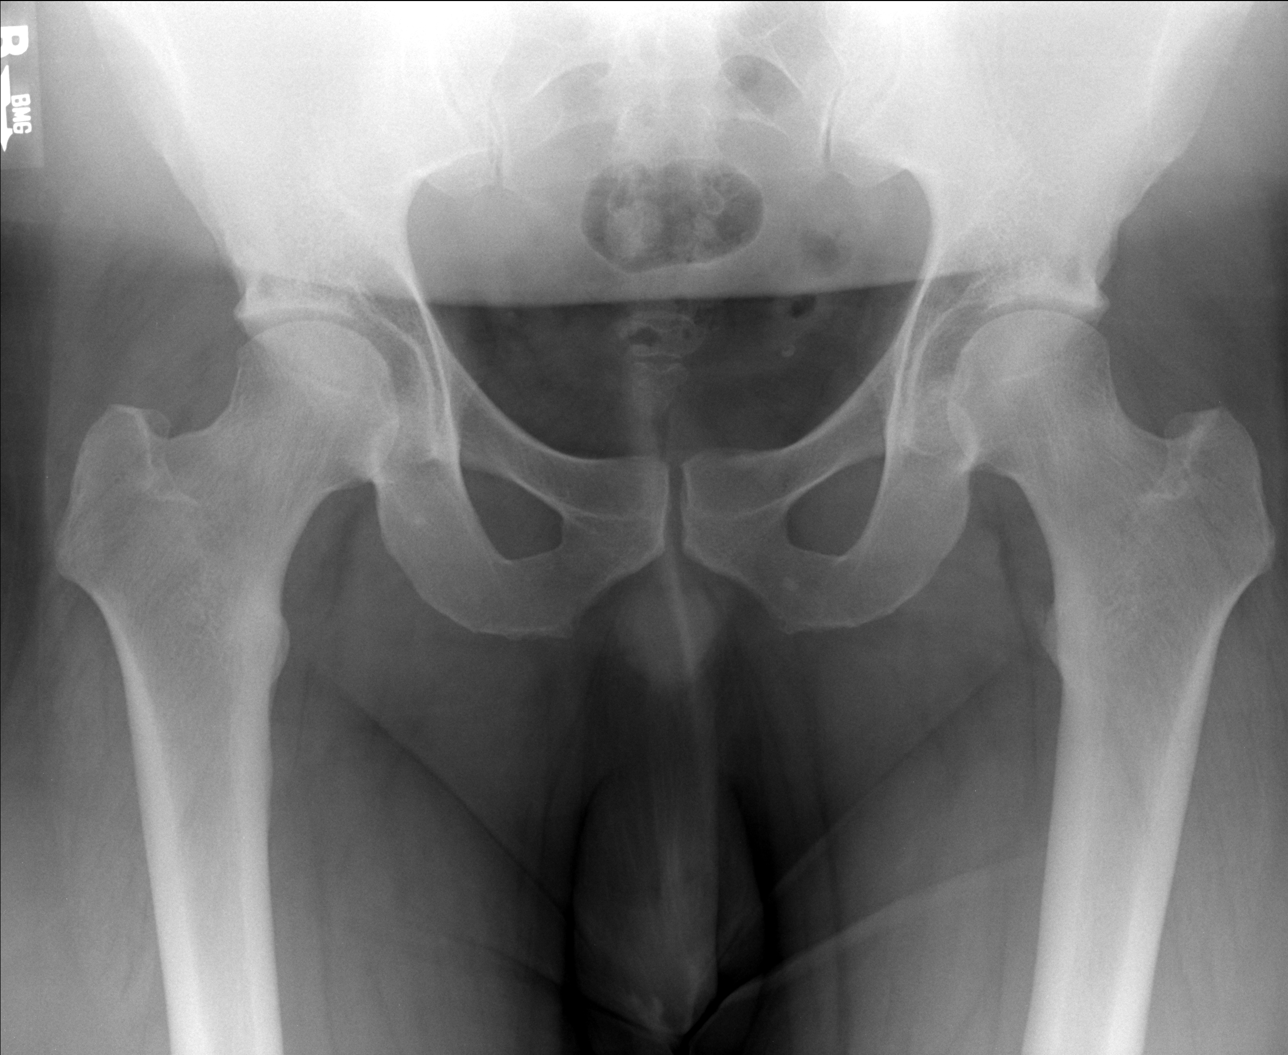

[AP (3 of 3)]
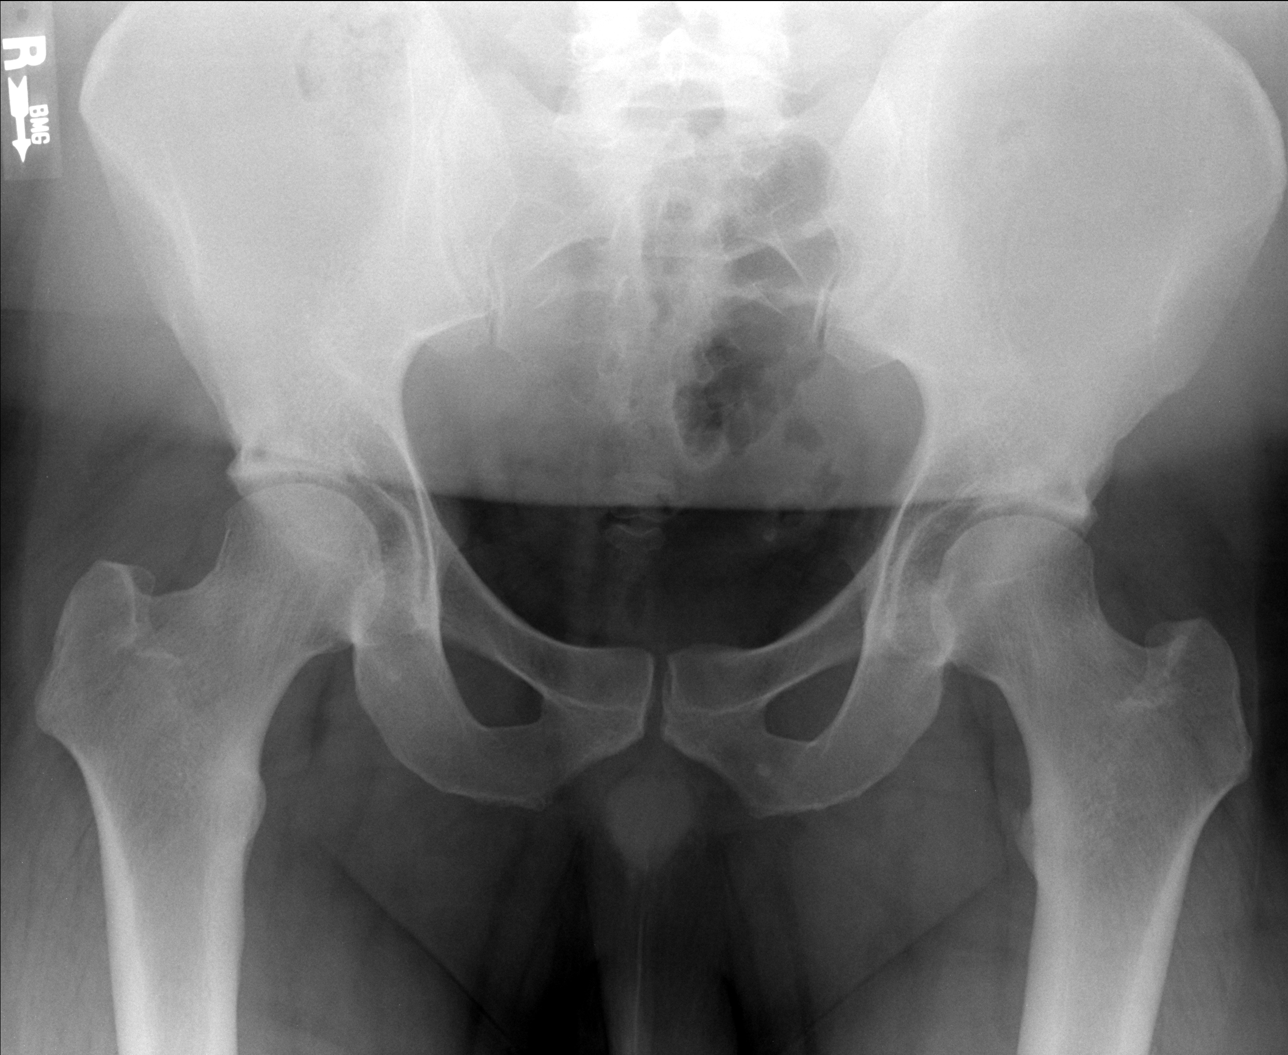

[4 of 4 positions shown; findings below may reference images not displayed]

FINDINGS: Normal alignment and no fracture.  Negative for AVN.
Mild joint space narrowing in the hips bilaterally.
IMPRESSION: Mild degenerative change of both hips.  No acute abnormality.

Clinically significant discrepancy from primary report, if
provided: None

## 2014-08-23 ENCOUNTER — Ambulatory Visit (INDEPENDENT_AMBULATORY_CARE_PROVIDER_SITE_OTHER): Payer: 59 | Admitting: Family Medicine

## 2014-08-23 VITALS — BP 132/80 | HR 110 | Temp 98.4°F | Resp 17 | Ht 70.5 in | Wt 301.0 lb

## 2014-08-23 DIAGNOSIS — K645 Perianal venous thrombosis: Secondary | ICD-10-CM | POA: Diagnosis not present

## 2014-08-23 DIAGNOSIS — R197 Diarrhea, unspecified: Secondary | ICD-10-CM | POA: Diagnosis not present

## 2014-08-23 DIAGNOSIS — R1084 Generalized abdominal pain: Secondary | ICD-10-CM | POA: Diagnosis not present

## 2014-08-23 LAB — POCT CBC
Granulocyte percent: 64.5 %G (ref 37–80)
HEMATOCRIT: 48.9 % (ref 43.5–53.7)
Hemoglobin: 16 g/dL (ref 14.1–18.1)
LYMPH, POC: 2.6 (ref 0.6–3.4)
MCH, POC: 27.5 pg (ref 27–31.2)
MCHC: 32.6 g/dL (ref 31.8–35.4)
MCV: 84.4 fL (ref 80–97)
MID (CBC): 0.9 (ref 0–0.9)
MPV: 7.9 fL (ref 0–99.8)
POC GRANULOCYTE: 6.4 (ref 2–6.9)
POC LYMPH PERCENT: 26.7 %L (ref 10–50)
POC MID %: 8.8 %M (ref 0–12)
Platelet Count, POC: 236 10*3/uL (ref 142–424)
RBC: 5.79 M/uL (ref 4.69–6.13)
RDW, POC: 13 %
WBC: 9.9 10*3/uL (ref 4.6–10.2)

## 2014-08-23 MED ORDER — CIPROFLOXACIN HCL 500 MG PO TABS: 500.0000 mg | ORAL_TABLET | Freq: Two times a day (BID) | ORAL | Status: DC

## 2014-08-23 MED ORDER — HYDROCORTISONE ACE-PRAMOXINE 2.5-1 % RE CREA: 1.0000 "application " | TOPICAL_CREAM | Freq: Three times a day (TID) | RECTAL | Status: DC

## 2014-08-23 MED ORDER — HYDROCODONE-ACETAMINOPHEN 5-325 MG PO TABS: 1.0000 | ORAL_TABLET | ORAL | Status: DC | PRN

## 2014-08-23 NOTE — Patient Instructions (Addendum)
Drink plenty of fluids  Avoid straining at stools  Clean well as discussed  Take the ciprofloxacin one twice daily for 5 days  Use hydrocodone one every 4-6 hours if needed for bad pain  Sitz baths once or twice daily  Return if problems.  Take some Pepto-Bismol if needed for the diarrhea. If it persists to much let me know.

## 2014-08-23 NOTE — Progress Notes (Signed)
  Subjective:  Patient ID: Bradley Morales, male    DOB: 30-Mar-1980  Age: 33 y.o. MRN: [id number]  Patient has had problems with diarrhea the last couple weeks. He has a persistent low abdominal pain. Initially had bad diarrhea, it is just a loose stool. He thinks he strained to heart disease got a hemorrhoid and is having a lot of pain in his rectum. OTC medications do not help a lot. Minimal bleeding if any   Objective:   No major distress. Normal bowel sounds. Abdomen soft without organomegaly or masses or tenderness. No CVA tenderness. He has a grape size hemorrhoid, firm thrombosed, minimally inflamed. No intraoral lesions.  Procedure note: 1% lidocaine was used to anesthetize lesion. Prepped with water and overall. Incision was made a large amount of clot obtained. Patient tolerated procedure well.  Assessment & Plan:   Assessment:  Diarrhea Thrombosed external hemorrhoid  Plan:  Treat with hemorrhoidal cream, ciprofloxacin, and hydrocodone if needed for pain. CBC because of the diarrhea Results for orders placed or performed in visit on 08/23/14  POCT CBC  Result Value Ref Range   WBC 9.9 4.6 - 10.2 K/uL   Lymph, poc 2.6 0.6 - 3.4   POC LYMPH PERCENT 26.7 10 - 50 %L   MID (cbc) 0.9 0 - 0.9   POC MID % 8.8 0 - 12 %M   POC Granulocyte 6.4 2 - 6.9   Granulocyte percent 64.5 37 - 80 %G   RBC 5.79 4.69 - 6.13 M/uL   Hemoglobin 16.0 14.1 - 18.1 g/dL   HCT, POC 48.9 43.5 - 53.7 %   MCV 84.4 80 - 97 fL   MCH, POC 27.5 27 - 31.2 pg   MCHC 32.6 31.8 - 35.4 g/dL   RDW, POC 13.0 %   Platelet Count, POC 236 142 - 424 K/uL   MPV 7.9 0 - 99.8 fL     Patient Instructions  Drink plenty of fluids  Avoid straining at stools  Clean well as discussed  Take the ciprofloxacin one twice daily for 5 days  Use hydrocodone one every 4-6 hours if needed for bad pain  Sitz baths once or twice daily  Return if problems.  Take some Pepto-Bismol if needed for the diarrhea. If it  persists to much let me know.     Bradley Gelardi, MD 08/23/2014

## 2014-12-16 ENCOUNTER — Encounter: Payer: Self-pay | Admitting: Internal Medicine

## 2015-01-22 ENCOUNTER — Ambulatory Visit (INDEPENDENT_AMBULATORY_CARE_PROVIDER_SITE_OTHER): Payer: 59 | Admitting: Internal Medicine

## 2015-01-22 ENCOUNTER — Encounter: Payer: Self-pay | Admitting: Internal Medicine

## 2015-01-22 VITALS — BP 133/84 | HR 78 | Temp 98.4°F | Resp 16 | Ht 71.0 in | Wt 293.0 lb

## 2015-01-22 DIAGNOSIS — I10 Essential (primary) hypertension: Secondary | ICD-10-CM | POA: Diagnosis not present

## 2015-01-22 DIAGNOSIS — M5410 Radiculopathy, site unspecified: Secondary | ICD-10-CM

## 2015-01-22 DIAGNOSIS — Z6841 Body Mass Index (BMI) 40.0 and over, adult: Secondary | ICD-10-CM | POA: Diagnosis not present

## 2015-01-22 DIAGNOSIS — Z0000 Encounter for general adult medical examination without abnormal findings: Secondary | ICD-10-CM | POA: Diagnosis not present

## 2015-01-22 LAB — CBC WITH DIFFERENTIAL/PLATELET
Basophils Absolute: 0 10*3/uL (ref 0.0–0.1)
Basophils Relative: 0 % (ref 0–1)
EOS PCT: 2 % (ref 0–5)
Eosinophils Absolute: 0.2 10*3/uL (ref 0.0–0.7)
HEMATOCRIT: 45.1 % (ref 39.0–52.0)
Hemoglobin: 15.5 g/dL (ref 13.0–17.0)
LYMPHS PCT: 27 % (ref 12–46)
Lymphs Abs: 2.4 10*3/uL (ref 0.7–4.0)
MCH: 28.3 pg (ref 26.0–34.0)
MCHC: 34.4 g/dL (ref 30.0–36.0)
MCV: 82.4 fL (ref 78.0–100.0)
MONO ABS: 0.7 10*3/uL (ref 0.1–1.0)
MONOS PCT: 8 % (ref 3–12)
MPV: 10.4 fL (ref 8.6–12.4)
Neutro Abs: 5.5 10*3/uL (ref 1.7–7.7)
Neutrophils Relative %: 63 % (ref 43–77)
Platelets: 238 10*3/uL (ref 150–400)
RBC: 5.47 MIL/uL (ref 4.22–5.81)
RDW: 13 % (ref 11.5–15.5)
WBC: 8.8 10*3/uL (ref 4.0–10.5)

## 2015-01-22 LAB — LIPID PANEL
CHOL/HDL RATIO: 4.6 ratio (ref <=5.0)
Cholesterol: 146 mg/dL (ref 125–200)
HDL: 32 mg/dL — AB (ref >=40)
LDL CALC: 84 mg/dL (ref <130)
TRIGLYCERIDES: 152 mg/dL — AB (ref <150)
VLDL: 30 mg/dL (ref <30)

## 2015-01-22 LAB — COMPREHENSIVE METABOLIC PANEL
ALT: 36 U/L (ref 9–46)
AST: 25 U/L (ref 10–40)
Albumin: 4.4 g/dL (ref 3.6–5.1)
Alkaline Phosphatase: 62 U/L (ref 40–115)
BILIRUBIN TOTAL: 0.6 mg/dL (ref 0.2–1.2)
BUN: 15 mg/dL (ref 7–25)
CALCIUM: 9.4 mg/dL (ref 8.6–10.3)
CHLORIDE: 105 mmol/L (ref 98–110)
CO2: 25 mmol/L (ref 20–31)
Creat: 0.99 mg/dL (ref 0.60–1.35)
GLUCOSE: 74 mg/dL (ref 65–99)
POTASSIUM: 4.2 mmol/L (ref 3.5–5.3)
Sodium: 139 mmol/L (ref 135–146)
Total Protein: 7 g/dL (ref 6.1–8.1)

## 2015-01-22 MED ORDER — TRAMADOL HCL 50 MG PO TABS: 50.0000 mg | ORAL_TABLET | Freq: Four times a day (QID) | ORAL | Status: DC | PRN

## 2015-01-22 MED ORDER — AMOXICILLIN 875 MG PO TABS: 875.0000 mg | ORAL_TABLET | Freq: Two times a day (BID) | ORAL | Status: DC

## 2015-01-22 MED ORDER — FLUTICASONE PROPIONATE 50 MCG/ACT NA SUSP: NASAL | Status: DC

## 2015-01-22 MED ORDER — LISINOPRIL 5 MG PO TABS: 5.0000 mg | ORAL_TABLET | Freq: Every day | ORAL | Status: DC

## 2015-01-22 NOTE — Progress Notes (Signed)
Subjective:    Patient ID: Bradley Morales, male    DOB: 1980-05-26, 35 y.o.   MRN: [id number]  HPI Chief Complaint  Patient presents with  . Annual Exam   Doing well x recent lifting inj str l LS area 2 w ago--hx same in past--getting better w/ stretching and occas tramadol  Patient Active Problem List   Diagnosis Date Noted  . Obesity due to excess calories----20lb wt loss!!!!   . Snoring disorder---unsure OSA--sleep apnea testing was undertaken with an AHI 27 with REM sleep and overall 9.2. CPAP was recommended but not as an obligatory thing--dr Dohmeier   . HTN (hypertension) 08/30/2012  . Palpitations---dr Klein=OK 04/06/2012  . OSA (obstructive sleep apnea) 03/06/2012  . Nicotine addiction 11/05/2011  . BMI 45.0-49.9, adult ([place]) 11/05/2011    electr cig 2-42yrs Daughter 5/6 in [place] Work ok No immun needed Review of Systems 14pt ros per form neg otherwise And also has 3 wk worsening nasal cong aft uri with troub sleep and puru d/c    Objective:   Physical Exam  Constitutional: He is oriented to person, place, and time. He appears well-developed and well-nourished.  obese  HENT:  Head: Normocephalic and atraumatic.  Right Ear: Hearing, tympanic membrane, external ear and ear canal normal.  Left Ear: Hearing, tympanic membrane, external ear and ear canal normal.  Nose: Nose normal.  Mouth/Throat: Uvula is midline, oropharynx is clear and moist and mucous membranes are normal.  Purulent nasal d/c  Eyes: Conjunctivae, EOM and lids are normal. Pupils are equal, round, and reactive to light. Right eye exhibits no discharge. Left eye exhibits no discharge. No scleral icterus.  Neck: Trachea normal and normal range of motion. Neck supple. Carotid bruit is not present.  Cardiovascular: Normal rate, regular rhythm, normal heart sounds, intact distal pulses and normal pulses.   No murmur heard. Pulmonary/Chest: Effort normal and breath sounds normal. No respiratory distress.  He has no wheezes. He has no rhonchi. He has no rales.  Abdominal: Soft. Normal appearance and bowel sounds are normal. He exhibits no abdominal bruit. There is no tenderness.  Musculoskeletal: Normal range of motion. He exhibits no edema or tenderness.  SLR on L pos at 75degr  Lymphadenopathy:       Head (right side): No submental, no submandibular, no tonsillar, no preauricular, no posterior auricular and no occipital adenopathy present.       Head (left side): No submental, no submandibular, no tonsillar, no preauricular, no posterior auricular and no occipital adenopathy present.    He has no cervical adenopathy.  Neurological: He is alert and oriented to person, place, and time. He has normal strength and normal reflexes. No cranial nerve deficit or sensory deficit. Coordination and gait normal.  Skin: Skin is warm, dry and intact. No lesion and no rash noted.  Psychiatric: He has a normal mood and affect. His speech is normal and behavior is normal. Judgment and thought content normal.   BP 133/84 mmHg  Pulse 78  Temp(Src) 98.4 F (36.9 C)  Resp 16  Ht 5\' 11"  (1.803 m)  Wt 293 lb (132.904 kg)  BMI 40.88 kg/m2    Assessment & Plan:  Annual physical exam  Essential hypertension - Plan: lisinopril (PRINIVIL,ZESTRIL) 5 MG tablet, CBC with Differential/Platelet, Comprehensive metabolic panel, Lipid panel  BMI 45.0-49.9, adult ([facility])  Radicular low back pain --cont stretching/occas meds  Max sinusitis Meds ordered this encounter  Medications  . lisinopril (PRINIVIL,ZESTRIL) 5 MG tablet  Sig: Take 1 tablet (5 mg total) by mouth daily.    Dispense:  90 tablet    Refill:  3  . traMADol (ULTRAM) 50 MG tablet    Sig: Take 1 tablet (50 mg total) by mouth every 6 (six) hours as needed. for pain    Dispense:  30 tablet    Refill:  5  . fluticasone (FLONASE) 50 MCG/ACT nasal spray    Sig: One spray each nostril twice a day for 3-4 weeks    Dispense:  16 g    Refill:  6  .  amoxicillin (AMOXIL) 875 MG tablet    Sig: Take 1 tablet (875 mg total) by mouth 2 (two) times daily.    Dispense:  20 tablet    Refill:  0   Contin wt loss!!!!!!!!

## 2015-01-27 ENCOUNTER — Encounter: Payer: Self-pay | Admitting: Internal Medicine

## 2015-07-21 ENCOUNTER — Telehealth: Payer: Self-pay

## 2015-07-21 NOTE — Telephone Encounter (Signed)
Patient is requesting an appointment w/Dr. Carlota Raspberry in January of 2018.   Please call [phone]

## 2016-01-26 ENCOUNTER — Encounter: Payer: 59 | Admitting: Family Medicine

## 2016-06-10 ENCOUNTER — Ambulatory Visit (INDEPENDENT_AMBULATORY_CARE_PROVIDER_SITE_OTHER): Payer: 59 | Admitting: Family Medicine

## 2016-06-10 ENCOUNTER — Encounter: Payer: Self-pay | Admitting: Family Medicine

## 2016-06-10 VITALS — BP 122/84 | HR 78 | Temp 97.9°F | Resp 97 | Ht 70.39 in | Wt 282.8 lb

## 2016-06-10 DIAGNOSIS — Z720 Tobacco use: Secondary | ICD-10-CM

## 2016-06-10 DIAGNOSIS — I10 Essential (primary) hypertension: Secondary | ICD-10-CM | POA: Diagnosis not present

## 2016-06-10 DIAGNOSIS — Z6841 Body Mass Index (BMI) 40.0 and over, adult: Secondary | ICD-10-CM

## 2016-06-10 DIAGNOSIS — L723 Sebaceous cyst: Secondary | ICD-10-CM

## 2016-06-10 DIAGNOSIS — IMO0001 Reserved for inherently not codable concepts without codable children: Secondary | ICD-10-CM

## 2016-06-10 DIAGNOSIS — F17210 Nicotine dependence, cigarettes, uncomplicated: Secondary | ICD-10-CM

## 2016-06-10 DIAGNOSIS — E669 Obesity, unspecified: Secondary | ICD-10-CM

## 2016-06-10 MED ORDER — LISINOPRIL 5 MG PO TABS: 5.0000 mg | ORAL_TABLET | Freq: Every day | ORAL | 1 refills | Status: DC

## 2016-06-10 NOTE — Progress Notes (Signed)
Subjective:  By signing my name below, I, Bradley Morales, attest that this documentation has been prepared under the direction and in the presence of Bradley Ray, MD. Electronically Signed: Moises Morales, [place]. 06/10/2016 , 8:28 AM .  Patient was seen in Room 25 .   Patient ID: Bradley Morales, male    DOB: 01/17/1981, 36 y.o.   MRN: [id number] Chief Complaint  Patient presents with  . Medication Refill    Lisinopril   HPI Bradley Morales is a 36 y.o. male Here for HTN follow up. His last physical was in Jan 2017 with Dr. Laney Morales. He's eaten breakfast prior to visit today: cheese and a gatorade. 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   Cyst He reports having a cyst over the back of his neck. He has occasional neck pain.   HTN Lab Results  Component Value Date   CREATININE 0.99 01/22/2015   He takes lisinopril 5mg  QD. He states he's been out of his medication for 3 days. He denies any complications with his medication. He doesn't recall his BP while on medication.   He restarted smoking again about 6-7 months ago, at 1 ppd. He notes, "my smoking is just a habit". He is unsure if he's ready to quit again at this time.   Obesity Wt Readings from Last 3 Encounters:  06/10/16 282 lb 12.8 oz (128.3 kg)  01/22/15 293 lb (132.9 kg)  08/23/14 (!) 301 lb (136.5 kg)   Body mass index is 40.12 kg/m.  Exercise He's loss a significant amount of weight in the past due to exercise. He mentions having "19 acres of land at home", and would work on that after returning home for exercise.   Diet He reports eating fast food about 3-4 times a week. He denies drinking sweet tea or soda. He denies eating breakfast. He has a rare beer at night.   Patient Active Problem List   Diagnosis Date Noted  . Obesity due to excess calories   . Snoring disorder   . HTN (hypertension) 08/30/2012  . Palpitations 04/06/2012  . OSA (obstructive sleep apnea) 03/06/2012  . Nicotine addiction  11/05/2011  . BMI 45.0-49.9, adult ([place]) 11/05/2011   Past Medical History:  Diagnosis Date  . Bell's palsy   . Hypertension   . Obesity due to excess calories   . OSA (obstructive sleep apnea)   . Snoring disorder    Past Surgical History:  Procedure Laterality Date  . VASECTOMY     No Known Allergies Prior to Admission medications   Medication Sig Start Date End Date Taking? Authorizing Provider  Ascorbic Acid (VITAMIN C) 1000 MG tablet Take 1,000 mg by mouth daily.   Yes [provider]  lisinopril (PRINIVIL,ZESTRIL) 5 MG tablet Take 1 tablet (5 mg total) by mouth daily. 01/22/15  Yes Leandrew Koyanagi, MD   Social History   Social History  . Marital status: Married    Spouse name: Melissa  . Number of children: 1  . Years of education: 61   Occupational History  . 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 Smoking status: Former Smoker    Quit date: 02/19/2012  . Smokeless tobacco: Never Used     Comment: uses an electronic cigarette  . Alcohol use Yes  . Drug use: No  . Sexual activity: Yes    Birth control/ protection: None  Comment: 1 partner in last 12 months   Other Topics Concern  . Not on file   Social History Narrative   Patient is married Programme researcher, broadcasting/film/video) and lives at home with his family.   Patient has one child.   Patient is working full-time.   Patient has a high school education.   Patient is right-handed.   Patient drinks 3 cups of soda per day.   Review of Systems  Constitutional: Negative for fatigue and unexpected weight change.  Eyes: Negative for visual disturbance.  Respiratory: Negative for cough, chest tightness and shortness of breath.   Cardiovascular: Negative for chest pain, palpitations and leg swelling.  Gastrointestinal: Negative for abdominal pain and Morales in stool.  Neurological: Negative for dizziness, light-headedness and headaches.       Objective:   Physical  Exam  Constitutional: He is oriented to person, place, and time. He appears well-developed and well-nourished.  HENT:  Head: Normocephalic and atraumatic.  Eyes: EOM are normal. Pupils are equal, round, and reactive to light.  Neck: No JVD present. Carotid bruit is not present.  Cardiovascular: Normal rate, regular rhythm and normal heart sounds.   No murmur heard. Pulmonary/Chest: Effort normal and breath sounds normal. He has no rales.  Abdominal: Soft. Bowel sounds are normal. He exhibits no distension. There is no tenderness.  Musculoskeletal: He exhibits no edema.  Neurological: He is alert and oriented to person, place, and time.  Skin: Skin is warm and dry.  Sebaceous cyst located over left posterior neck with some surrounding erythema  Psychiatric: He has a normal mood and affect.  Vitals reviewed.   Vitals:   06/10/16 0806  BP: (!) 149/91  Pulse: 78  Resp: (!) 97  Temp: 97.9 F (36.6 C)  TempSrc: Oral  Weight: 282 lb 12.8 oz (128.3 kg)  Height: 5' 10.39" (1.788 m)      Assessment & Plan:   Bradley Morales is a 36 y.o. male Essential hypertension - Plan: Basic metabolic panel, lisinopril (PRINIVIL,ZESTRIL) 5 MG tablet, Care order/instruction:  - Decreased control as off medication currently. Restart lisinopril 5 mg daily, check BMP as not fasting, return in 6 months for physical with fasting labs at that time. Monitor home readings.  Tobacco abuse  - Cessation discussed, questions regarding [place] were answered, handout given. Over 3 minutes time.  Class 3 obesity without serious comorbidity with body mass index (BMI) of 40.0 to 44.9 in adult, unspecified obesity type ([place])  - Commended on continued weight loss with exercise. Other tips for weight loss were given, recheck in 6 months.  Sebaceous cyst  -Currently does not appear infected. rtc precautions.   Meds ordered this encounter  Medications  . lisinopril (PRINIVIL,ZESTRIL) 5 MG tablet    Sig: Take 1 tablet  (5 mg total) by mouth daily.    Dispense:  90 tablet    Refill:  1   Patient Instructions    Continue exercise, decrease fast food, breakfast every day to help with weight loss.   [name] smoking cessation clinics. Registration is required. To register call [phone] or register online at [email or web address]  If you would like to have the sebaceous cyst on your neck removed, return for separate appointment. If redness or increased pain in that area, return sooner.  Recheck in the next 6 months for a physical. No change in Morales pressure medications for now.   Steps to Quit Smoking Smoking tobacco can be harmful to your health and can affect almost  every organ in your body. Smoking puts you, and those around you, at risk for developing many serious chronic diseases. Quitting smoking is difficult, but it is one of the best things that you can do for your health. It is never too late to quit. What are the benefits of quitting smoking? When you quit smoking, you lower your risk of developing serious diseases and conditions, such as:  Lung cancer or lung disease, such as COPD.  Heart disease.  Stroke.  Heart attack.  Infertility.  Osteoporosis and bone fractures. Additionally, symptoms such as coughing, wheezing, and shortness of breath may get better when you quit. You may also find that you get sick less often because your body is stronger at fighting off colds and infections. If you are pregnant, quitting smoking can help to reduce your chances of having a baby of low birth weight. How do I get ready to quit? When you decide to quit smoking, create a plan to make sure that you are successful. Before you quit:  Pick a date to quit. Set a date within the next two weeks to give you time to prepare.  Write down the reasons why you are quitting. Keep this list in places where you will see it often, such as on your bathroom mirror or in your car or wallet.  Identify the  people, places, things, and activities that make you want to smoke (triggers) and avoid them. Make sure to take these actions:  Throw away all cigarettes at home, at work, and in your car.  Throw away smoking accessories, such as Scientist, research (medical).  Clean your car and make sure to empty the ashtray.  Clean your home, including curtains and carpets.  Tell your family, friends, and coworkers that you are quitting. Support from your loved ones can make quitting easier.  Talk with your health care provider about your options for quitting smoking.  Find out what treatment options are covered by your health insurance. What strategies can I use to quit smoking? Talk with your healthcare provider about different strategies to quit smoking. Some strategies include:  Quitting smoking altogether instead of gradually lessening how much you smoke over a period of time. Research shows that quitting "cold [place]" is more successful than gradually quitting.  Attending in-person counseling to help you build problem-solving skills. You are more likely to have success in quitting if you attend several counseling sessions. Even short sessions of 10 minutes can be effective.  Finding resources and support systems that can help you to quit smoking and remain smoke-free after you quit. These resources are most helpful when you use them often. They can include:  Online chats with a Social worker.  Telephone quitlines.  Printed Furniture conservator/restorer.  Support groups or group counseling.  Text messaging programs.  Mobile phone applications.  Taking medicines to help you quit smoking. (If you are pregnant or breastfeeding, talk with your health care provider first.) Some medicines contain nicotine and some do not. Both types of medicines help with cravings, but the medicines that include nicotine help to relieve withdrawal symptoms. Your health care provider may recommend:  Nicotine patches, gum, or  lozenges.  Nicotine inhalers or sprays.  Non-nicotine medicine that is taken by mouth. Talk with your health care provider about combining strategies, such as taking medicines while you are also receiving in-person counseling. Using these two strategies together makes you more likely to succeed in quitting than if you used either strategy on its own. If  you are pregnant or breastfeeding, talk with your health care provider about finding counseling or other support strategies to quit smoking. Do not take medicine to help you quit smoking unless told to do so by your health care provider. What things can I do to make it easier to quit? Quitting smoking might feel overwhelming at first, but there is a lot that you can do to make it easier. Take these important actions:  Reach out to your family and friends and ask that they support and encourage you during this time. Call telephone quitlines, reach out to support groups, or work with a counselor for support.  Ask people who smoke to avoid smoking around you.  Avoid places that trigger you to smoke, such as bars, parties, or smoke-break areas at work.  Spend time around people who do not smoke.  Lessen stress in your life, because stress can be a smoking trigger for some people. To lessen stress, try:  Exercising regularly.  Deep-breathing exercises.  Yoga.  Meditating.  Performing a body scan. This involves closing your eyes, scanning your body from head to toe, and noticing which parts of your body are particularly tense. Purposefully relax the muscles in those areas.  Download or purchase mobile phone or tablet apps (applications) that can help you stick to your quit plan by providing reminders, tips, and encouragement. There are many free apps, such as QuitGuide from the [employer] Office manager for Disease Control and Prevention). You can find other support for quitting smoking (smoking cessation) through smokefree.gov and other websites. How  will I feel when I quit smoking? Within the first 24 hours of quitting smoking, you may start to feel some withdrawal symptoms. These symptoms are usually most noticeable 2-3 days after quitting, but they usually do not last beyond 2-3 weeks. Changes or symptoms that you might experience include:  Mood swings.  Restlessness, anxiety, or irritation.  Difficulty concentrating.  Dizziness.  Strong cravings for sugary foods in addition to nicotine.  Mild weight gain.  Constipation.  Nausea.  Coughing or a sore throat.  Changes in how your medicines work in your body.  A depressed mood.  Difficulty sleeping (insomnia). After the first 2-3 weeks of quitting, you may start to notice more positive results, such as:  Improved sense of smell and taste.  Decreased coughing and sore throat.  Slower heart rate.  Lower Morales pressure.  Clearer skin.  The ability to breathe more easily.  Fewer sick days. Quitting smoking is very challenging for most people. Do not get discouraged if you are not successful the first time. Some people need to make many attempts to quit before they achieve long-term success. Do your best to stick to your quit plan, and talk with your health care provider if you have any questions or concerns. This information is not intended to replace advice given to you by your health care provider. Make sure you discuss any questions you have with your health care provider. Document Released: 12/29/2000 Document Revised: 09/02/2015 Document Reviewed: 05/21/2014 Elsevier Interactive Patient Education  2017 [employer].     IF you received an x-Morales today, you will receive an invoice from [facility]. Please contact [facility] at [phone] with questions or concerns regarding your invoice.   IF you received labwork today, you will receive an invoice from [facility]. Please contact [facility] at [phone] with questions or concerns regarding  your invoice.   Our billing staff will not be able to assist you with questions  regarding bills from these companies.  You will be contacted with the lab results as soon as they are available. The fastest way to get your results is to activate your My Chart account. Instructions are located on the last page of this paperwork. If you have not heard from [place] regarding the results in 2 weeks, please contact this office.      I personally performed the services described in this documentation, which was scribed in my presence. The recorded information has been reviewed and considered for accuracy and completeness, addended by me as needed, and agree with information above.  Signed,   Bradley Ray, MD [facility].  06/10/16 8:32 AM

## 2016-06-10 NOTE — Patient Instructions (Addendum)
Continue exercise, decrease fast food, breakfast every day to help with weight loss.   [facility] offers smoking cessation clinics. Registration is required. To register call [phone] or register online at [email or web address]  If you would like to have the sebaceous cyst on your neck removed, return for separate appointment. If redness or increased pain in that area, return sooner.  Recheck in the next 6 months for a physical. No change in blood pressure medications for now.   Steps to Quit Smoking Smoking tobacco can be harmful to your health and can affect almost every organ in your body. Smoking puts you, and those around you, at risk for developing many serious chronic diseases. Quitting smoking is difficult, but it is one of the best things that you can do for your health. It is never too late to quit. What are the benefits of quitting smoking? When you quit smoking, you lower your risk of developing serious diseases and conditions, such as:  Lung cancer or lung disease, such as COPD.  Heart disease.  Stroke.  Heart attack.  Infertility.  Osteoporosis and bone fractures. Additionally, symptoms such as coughing, wheezing, and shortness of breath may get better when you quit. You may also find that you get sick less often because your body is stronger at fighting off colds and infections. If you are pregnant, quitting smoking can help to reduce your chances of having a baby of low birth weight. How do I get ready to quit? When you decide to quit smoking, create a plan to make sure that you are successful. Before you quit:  Pick a date to quit. Set a date within the next two weeks to give you time to prepare.  Write down the reasons why you are quitting. Keep this list in places where you will see it often, such as on your bathroom mirror or in your car or wallet.  Identify the people, places, things, and activities that make you want to smoke (triggers) and avoid them. Make  sure to take these actions:  Throw away all cigarettes at home, at work, and in your car.  Throw away smoking accessories, such as Scientist, research (medical).  Clean your car and make sure to empty the ashtray.  Clean your home, including curtains and carpets.  Tell your family, friends, and coworkers that you are quitting. Support from your loved ones can make quitting easier.  Talk with your health care provider about your options for quitting smoking.  Find out what treatment options are covered by your health insurance. What strategies can I use to quit smoking? Talk with your healthcare provider about different strategies to quit smoking. Some strategies include:  Quitting smoking altogether instead of gradually lessening how much you smoke over a period of time. Research shows that quitting "cold [place]" is more successful than gradually quitting.  Attending in-person counseling to help you build problem-solving skills. You are more likely to have success in quitting if you attend several counseling sessions. Even short sessions of 10 minutes can be effective.  Finding resources and support systems that can help you to quit smoking and remain smoke-free after you quit. These resources are most helpful when you use them often. They can include:  Online chats with a Social worker.  Telephone quitlines.  Printed Furniture conservator/restorer.  Support groups or group counseling.  Text messaging programs.  Mobile phone applications.  Taking medicines to help you quit smoking. (If you are pregnant or breastfeeding, talk with your health care  provider first.) Some medicines contain nicotine and some do not. Both types of medicines help with cravings, but the medicines that include nicotine help to relieve withdrawal symptoms. Your health care provider may recommend:  Nicotine patches, gum, or lozenges.  Nicotine inhalers or sprays.  Non-nicotine medicine that is taken by mouth. Talk with your  health care provider about combining strategies, such as taking medicines while you are also receiving in-person counseling. Using these two strategies together makes you more likely to succeed in quitting than if you used either strategy on its own. If you are pregnant or breastfeeding, talk with your health care provider about finding counseling or other support strategies to quit smoking. Do not take medicine to help you quit smoking unless told to do so by your health care provider. What things can I do to make it easier to quit? Quitting smoking might feel overwhelming at first, but there is a lot that you can do to make it easier. Take these important actions:  Reach out to your family and friends and ask that they support and encourage you during this time. Call telephone quitlines, reach out to support groups, or work with a counselor for support.  Ask people who smoke to avoid smoking around you.  Avoid places that trigger you to smoke, such as bars, parties, or smoke-break areas at work.  Spend time around people who do not smoke.  Lessen stress in your life, because stress can be a smoking trigger for some people. To lessen stress, try:  Exercising regularly.  Deep-breathing exercises.  Yoga.  Meditating.  Performing a body scan. This involves closing your eyes, scanning your body from head to toe, and noticing which parts of your body are particularly tense. Purposefully relax the muscles in those areas.  Download or purchase mobile phone or tablet apps (applications) that can help you stick to your quit plan by providing reminders, tips, and encouragement. There are many free apps, such as QuitGuide from the [employer] Office manager for Disease Control and Prevention). You can find other support for quitting smoking (smoking cessation) through smokefree.gov and other websites. How will I feel when I quit smoking? Within the first 24 hours of quitting smoking, you may start to feel some  withdrawal symptoms. These symptoms are usually most noticeable 2-3 days after quitting, but they usually do not last beyond 2-3 weeks. Changes or symptoms that you might experience include:  Mood swings.  Restlessness, anxiety, or irritation.  Difficulty concentrating.  Dizziness.  Strong cravings for sugary foods in addition to nicotine.  Mild weight gain.  Constipation.  Nausea.  Coughing or a sore throat.  Changes in how your medicines work in your body.  A depressed mood.  Difficulty sleeping (insomnia). After the first 2-3 weeks of quitting, you may start to notice more positive results, such as:  Improved sense of smell and taste.  Decreased coughing and sore throat.  Slower heart rate.  Lower blood pressure.  Clearer skin.  The ability to breathe more easily.  Fewer sick days. Quitting smoking is very challenging for most people. Do not get discouraged if you are not successful the first time. Some people need to make many attempts to quit before they achieve long-term success. Do your best to stick to your quit plan, and talk with your health care provider if you have any questions or concerns. This information is not intended to replace advice given to you by your health care provider. Make sure you discuss any  questions you have with your health care provider. Document Released: 12/29/2000 Document Revised: 09/02/2015 Document Reviewed: 05/21/2014 Elsevier Interactive Patient Education  2017 [employer].     IF you received an x-ray today, you will receive an invoice from [facility]. Please contact [facility] at [phone] with questions or concerns regarding your invoice.   IF you received labwork today, you will receive an invoice from [facility]. Please contact [facility] at [phone] with questions or concerns regarding your invoice.   Our billing staff will not be able to assist you with questions regarding bills from these  companies.  You will be contacted with the lab results as soon as they are available. The fastest way to get your results is to activate your My Chart account. Instructions are located on the last page of this paperwork. If you have not heard from [place] regarding the results in 2 weeks, please contact this office.

## 2016-06-11 LAB — BASIC METABOLIC PANEL
BUN/Creatinine Ratio: 16 (ref 9–20)
BUN: 16 mg/dL (ref 6–20)
CALCIUM: 9.8 mg/dL (ref 8.7–10.2)
CHLORIDE: 102 mmol/L (ref 96–106)
CO2: 23 mmol/L (ref 18–29)
Creatinine, Ser: 0.99 mg/dL (ref 0.76–1.27)
GFR, EST AFRICAN AMERICAN: 115 mL/min/{1.73_m2} (ref >59)
GFR, EST NON AFRICAN AMERICAN: 100 mL/min/{1.73_m2} (ref >59)
Glucose: 85 mg/dL (ref 65–99)
POTASSIUM: 4.8 mmol/L (ref 3.5–5.2)
SODIUM: 139 mmol/L (ref 134–144)

## 2016-06-15 ENCOUNTER — Encounter: Payer: Self-pay | Admitting: Radiology

## 2016-12-16 ENCOUNTER — Encounter: Payer: 59 | Admitting: Family Medicine

## 2016-12-21 ENCOUNTER — Ambulatory Visit (INDEPENDENT_AMBULATORY_CARE_PROVIDER_SITE_OTHER): Payer: 59 | Admitting: Family Medicine

## 2016-12-21 ENCOUNTER — Other Ambulatory Visit: Payer: Self-pay

## 2016-12-21 ENCOUNTER — Encounter: Payer: Self-pay | Admitting: Family Medicine

## 2016-12-21 VITALS — BP 132/78 | HR 81 | Temp 97.6°F | Resp 18 | Ht 70.0 in | Wt 278.0 lb

## 2016-12-21 DIAGNOSIS — J019 Acute sinusitis, unspecified: Secondary | ICD-10-CM

## 2016-12-21 DIAGNOSIS — Z114 Encounter for screening for human immunodeficiency virus [HIV]: Secondary | ICD-10-CM

## 2016-12-21 DIAGNOSIS — I10 Essential (primary) hypertension: Secondary | ICD-10-CM | POA: Diagnosis not present

## 2016-12-21 DIAGNOSIS — Z0000 Encounter for general adult medical examination without abnormal findings: Secondary | ICD-10-CM | POA: Diagnosis not present

## 2016-12-21 DIAGNOSIS — E669 Obesity, unspecified: Secondary | ICD-10-CM | POA: Diagnosis not present

## 2016-12-21 DIAGNOSIS — Z13220 Encounter for screening for lipoid disorders: Secondary | ICD-10-CM | POA: Diagnosis not present

## 2016-12-21 DIAGNOSIS — Z720 Tobacco use: Secondary | ICD-10-CM | POA: Diagnosis not present

## 2016-12-21 DIAGNOSIS — Z6839 Body mass index (BMI) 39.0-39.9, adult: Secondary | ICD-10-CM

## 2016-12-21 MED ORDER — AMOXICILLIN-POT CLAVULANATE 875-125 MG PO TABS: 1.0000 | ORAL_TABLET | Freq: Two times a day (BID) | ORAL | 0 refills | Status: DC

## 2016-12-21 MED ORDER — LISINOPRIL 5 MG PO TABS: 5.0000 mg | ORAL_TABLET | Freq: Every day | ORAL | 2 refills | Status: DC

## 2016-12-21 MED ORDER — LISINOPRIL 5 MG PO TABS: 5.0000 mg | ORAL_TABLET | Freq: Every day | ORAL | 1 refills | Status: DC

## 2016-12-21 NOTE — Progress Notes (Signed)
Subjective:  By signing my name below, I, [name], attest that this documentation has been prepared under the direction and in the presence of [name], MD Electronically Signed: Ladene Artist, ED Scribe 12/21/2016 at 10:29 AM.   Patient ID: Bradley Morales, male    DOB: 1980-10-20, 36 y.o.   MRN: [id number]  Chief Complaint  Patient presents with  . Annual Exam  . Sinus Problem    x1week    HPI Bradley Morales is a 36 y.o. male who presents to Primary Care at [facility] for an annual exam. Pt ate a piece of bread around 7 AM this morning.  Sinusitis Pt reports nasal congestion, discolored nasal discharge and sinus pressure/pain x 1 week. Pt has tried Advil Cold and Sinus without significant relief. He denies fever. States his daughter was ill with similar symptoms. Pt is a smoker.  Smoking Cessation Pt has tried quitting a few years ago. States he is not ready to quit at this time.  HTN Lisinopril 5 mg qd. Pt checks his BP at home with readings of 130s/70s. Denies any side effects.   Snoring  Denies worsening snoring or daytime somnolence.  Family Hx No family hx CA. Dad was recently diagnosed with DM.  STI Screening Pt is in a monogamous relationship. Declines STI screening at this visit.  Immunizations Immunization History  Administered Date(s) Administered  . Influenza Split 11/03/2011, 09/05/2012  . Influenza-Unspecified 11/07/2013, 11/14/2014  . Tdap 01/18/2010  Received a flu vaccine at work in October.   Depression Screening Depression screen [facility] 2/9 12/21/2016 06/10/2016 01/22/2015 08/23/2014 06/11/2014  Decreased Interest 0 0 0 0 0  Down, Depressed, Hopeless 0 0 0 0 0  PHQ - 2 Score 0 0 0 0 0     Visual Acuity Screening   Right eye Left eye Both eyes  Without correction: 20/20 20/13-2 20/13-2  With correction:      Vision: does not wear glasses or contacts  Dentist: followed by Dr. Olena Heckle regularly  Exercise: pt works on a farm Diet: states he  typically drinks water but does have sodas with meals. Also reports having late night meals and larger portions. Body mass index is 39.89 kg/m. Wt Readings from Last 3 Encounters:  12/21/16 278 lb (126.1 kg)  06/10/16 282 lb 12.8 oz (128.3 kg)  01/22/15 293 lb (132.9 kg)   Patient Active Problem List   Diagnosis Date Noted  . Obesity due to excess calories   . Snoring disorder   . HTN (hypertension) 08/30/2012  . Palpitations 04/06/2012  . OSA (obstructive sleep apnea) 03/06/2012  . Nicotine addiction 11/05/2011  . BMI 45.0-49.9, adult ([place]) 11/05/2011   Past Medical History:  Diagnosis Date  . Bell's palsy   . Hypertension   . Obesity due to excess calories   . OSA (obstructive sleep apnea)   . Snoring disorder    Past Surgical History:  Procedure Laterality Date  . VASECTOMY     No Known Allergies Prior to Admission medications   Medication Sig Start Date End Date Taking? Authorizing Provider  lisinopril (PRINIVIL,ZESTRIL) 5 MG tablet Take 1 tablet (5 mg total) by mouth daily. 06/10/16  Yes [name], MD  Ascorbic Acid (VITAMIN C) 1000 MG tablet Take 1,000 mg by mouth daily.    [provider]   Social History   Socioeconomic History  . Marital status: Married    Spouse name: Melissa  . Number of children: 1  . Years  of education: 77  . Highest education level: Not on file  Social Needs  . Financial resource strain: Not on file  . Food insecurity - worry: Not on file  . Food insecurity - inability: Not on file  . Transportation needs - medical: Not on file  . Transportation needs - non-medical: Not on file  Occupational History  . 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 Occupation: Music therapist: [employer]  Tobacco Use  . Smoking status: Current Every Day Smoker    Types: Cigarettes    Last attempt to quit: 02/19/2012    Years since quitting: 4.8  . Smokeless tobacco: Never Used  . Tobacco comment: uses  an electronic cigarette  Substance and Sexual Activity  . Alcohol use: Yes  . Drug use: No  . Sexual activity: Yes    Birth control/protection: None    Comment: 1 partner in last 12 months  Other Topics Concern  . Not on file  Social History Narrative   Patient is married Programme researcher, broadcasting/film/video) and lives at home with his family.   Patient has one child.   Patient is working full-time.   Patient has a high school education.   Patient is right-handed.   Patient drinks 3 cups of soda per day.   Review of Systems  Constitutional: Negative for fever.  HENT: Positive for congestion, sinus pressure and sinus pain.   Respiratory: Negative for shortness of breath.   Cardiovascular: Negative for chest pain.  Gastrointestinal: Negative for blood in stool.  Neurological: Negative for dizziness, light-headedness and headaches.  Psychiatric/Behavioral: Negative for sleep disturbance.      Objective:   Physical Exam  Constitutional: He is oriented to person, place, and time. He appears well-developed and well-nourished.  HENT:  Head: Normocephalic and atraumatic.  Right Ear: Tympanic membrane, external ear and ear canal normal.  Left Ear: Tympanic membrane, external ear and ear canal normal.  Nose: No rhinorrhea. Right sinus exhibits maxillary sinus tenderness (slight). Left sinus exhibits maxillary sinus tenderness (slight).  Mouth/Throat: Oropharynx is clear and moist and mucous membranes are normal. No oropharyngeal exudate or posterior oropharyngeal erythema.  Nose: Small amount of discolored discharge, R greater than L nares  Eyes: Conjunctivae and EOM are normal. Pupils are equal, round, and reactive to light.  Neck: Normal range of motion. Neck supple. No thyromegaly present.  Cardiovascular: Normal rate, regular rhythm, normal heart sounds and intact distal pulses.  No murmur heard. Pulmonary/Chest: Effort normal and breath sounds normal. No respiratory distress. He has no wheezes. He has no  rhonchi. He has no rales.  Abdominal: Soft. He exhibits no distension. There is no tenderness.  Musculoskeletal: Normal range of motion. He exhibits no edema or tenderness.  Lymphadenopathy:    He has no cervical adenopathy.  Neurological: He is alert and oriented to person, place, and time. He has normal reflexes.  Skin: Skin is warm and dry. No rash noted.  Psychiatric: He has a normal mood and affect. His behavior is normal.  Vitals reviewed.  Vitals:   12/21/16 0924  BP: 132/78  Pulse: 81  Resp: 18  Temp: 97.6 F (36.4 C)  TempSrc: Oral  SpO2: 100%  Weight: 278 lb (126.1 kg)  Height: 5\' 10"  (1.778 m)      Assessment & Plan:    Bradley Morales is a 36 y.o. male Annual physical exam  --anticipatory guidance as below in AVS, screening labs above. Health maintenance items  as above in HPI discussed/recommended as applicable.   Acute sinusitis, recurrence not specified, unspecified location - Plan: amoxicillin-clavulanate (AUGMENTIN) 875-125 MG tablet  - Possible initial viral illness now with likely early sinusitis. Symptomatic care with saline nasal spray, then if not improving next few days, start Augmentin. Potential side effects/risks discussed, RTC precautions  Essential hypertension - Plan: Comprehensive metabolic panel, lisinopril (PRINIVIL,ZESTRIL) 5 MG tablet, DISCONTINUED: lisinopril (PRINIVIL,ZESTRIL) 5 MG tablet  - Stable, tolerating current regimen. Current meds refilled. Labs pending as above.   Class 2 obesity with body mass index (BMI) of 39.0 to 39.9 in adult, unspecified obesity type, unspecified whether serious comorbidity present  - avoid late night meals, portion control discussed, and exercise  Tobacco abuse  - Cessation discussed. Advised that I am ready to help him when he is ready to quit. Screening for hyperlipidemia - Plan: Lipid panel  Encounter for screening for HIV - Plan: HIV antibody   Meds ordered this encounter  Medications  .  amoxicillin-clavulanate (AUGMENTIN) 875-125 MG tablet    Sig: Take 1 tablet by mouth 2 (two) times daily.    Dispense:  20 tablet    Refill:  0  . DISCONTD: lisinopril (PRINIVIL,ZESTRIL) 5 MG tablet    Sig: Take 1 tablet (5 mg total) by mouth daily.    Dispense:  90 tablet    Refill:  1  . lisinopril (PRINIVIL,ZESTRIL) 5 MG tablet    Sig: Take 1 tablet (5 mg total) by mouth daily.    Dispense:  90 tablet    Refill:  2   Patient Instructions   Start saline nasal spray 4-5 times per day, drink plenty of fluids, then if sinus symptoms are not improving in next few days, start antibiotic.  Return to the clinic or go to the nearest emergency room if any of your symptoms worsen or new symptoms occur.  When you are ready to quit smoking, I am ready to help you.   Avoid late night meals and watch portions to help with weight loss.   Thanks for coming in today.    Sinusitis, Adult Sinusitis is soreness and inflammation of your sinuses. Sinuses are hollow spaces in the bones around your face. Your sinuses are located:  Around your eyes.  In the middle of your forehead.  Behind your nose.  In your cheekbones.  Your sinuses and nasal passages are lined with a stringy fluid (mucus). Mucus normally drains out of your sinuses. When your nasal tissues become inflamed or swollen, the mucus can become trapped or blocked so air cannot flow through your sinuses. This allows bacteria, viruses, and funguses to grow, which leads to infection. Sinusitis can develop quickly and last for 7?10 days (acute) or for more than 12 weeks (chronic). Sinusitis often develops after a cold. What are the causes? This condition is caused by anything that creates swelling in the sinuses or stops mucus from draining, including:  Allergies.  Asthma.  Bacterial or viral infection.  Abnormally shaped bones between the nasal passages.  Nasal growths that contain mucus (nasal polyps).  Narrow sinus  openings.  Pollutants, such as chemicals or irritants in the air.  A foreign object stuck in the nose.  A fungal infection. This is rare.  What increases the risk? The following factors may make you more likely to develop this condition:  Having allergies or asthma.  Having had a recent cold or respiratory tract infection.  Having structural deformities or blockages in your nose or sinuses.  Having a weak immune system.  Doing a lot of swimming or diving.  Overusing nasal sprays.  Smoking.  What are the signs or symptoms? The main symptoms of this condition are pain and a feeling of pressure around the affected sinuses. Other symptoms include:  Upper toothache.  Earache.  Headache.  Bad breath.  Decreased sense of smell and taste.  A cough that may get worse at night.  Fatigue.  Fever.  Thick drainage from your nose. The drainage is often green and it may contain pus (purulent).  Stuffy nose or congestion.  Postnasal drip. This is when extra mucus collects in the throat or back of the nose.  Swelling and warmth over the affected sinuses.  Sore throat.  Sensitivity to light.  How is this diagnosed? This condition is diagnosed based on symptoms, a medical history, and a physical exam. To find out if your condition is acute or chronic, your health care provider may:  Look in your nose for signs of nasal polyps.  Tap over the affected sinus to check for signs of infection.  View the inside of your sinuses using an imaging device that has a light attached (endoscope).  If your health care provider suspects that you have chronic sinusitis, you may also:  Be tested for allergies.  Have a sample of mucus taken from your nose (nasal culture) and checked for bacteria.  Have a mucus sample examined to see if your sinusitis is related to an allergy.  If your sinusitis does not respond to treatment and it lasts longer than 8 weeks, you may have an MRI or CT  scan to check your sinuses. These scans also help to determine how severe your infection is. In rare cases, a bone biopsy may be done to rule out more serious types of fungal sinus disease. How is this treated? Treatment for sinusitis depends on the cause and whether your condition is chronic or acute. If a virus is causing your sinusitis, your symptoms will go away on their own within 10 days. You may be given medicines to relieve your symptoms, including:  Topical nasal decongestants. They shrink swollen nasal passages and let mucus drain from your sinuses.  Antihistamines. These drugs block inflammation that is triggered by allergies. This can help to ease swelling in your nose and sinuses.  Topical nasal corticosteroids. These are nasal sprays that ease inflammation and swelling in your nose and sinuses.  Nasal saline washes. These rinses can help to get rid of thick mucus in your nose.  If your condition is caused by bacteria, you will be given an antibiotic medicine. If your condition is caused by a fungus, you will be given an antifungal medicine. Surgery may be needed to correct underlying conditions, such as narrow nasal passages. Surgery may also be needed to remove polyps. Follow these instructions at home: Medicines  Take, use, or apply over-the-counter and prescription medicines only as told by your health care provider. These may include nasal sprays.  If you were prescribed an antibiotic medicine, take it as told by your health care provider. Do not stop taking the antibiotic even if you start to feel better. Hydrate and Humidify  Drink enough water to keep your urine clear or pale yellow. Staying hydrated will help to thin your mucus.  Use a cool mist humidifier to keep the humidity level in your home above 50%.  Inhale steam for 10-15 minutes, 3-4 times a day or as told by your health care provider.  You can do this in the bathroom while a hot shower is running.  Limit  your exposure to cool or dry air. Rest  Rest as much as possible.  Sleep with your head raised (elevated).  Make sure to get enough sleep each night. General instructions  Apply a warm, moist washcloth to your face 3-4 times a day or as told by your health care provider. This will help with discomfort.  Wash your hands often with soap and water to reduce your exposure to viruses and other germs. If soap and water are not available, use hand sanitizer.  Do not smoke. Avoid being around people who are smoking (secondhand smoke).  Keep all follow-up visits as told by your health care provider. This is important. Contact a health care provider if:  You have a fever.  Your symptoms get worse.  Your symptoms do not improve within 10 days. Get help right away if:  You have a severe headache.  You have persistent vomiting.  You have pain or swelling around your face or eyes.  You have vision problems.  You develop confusion.  Your neck is stiff.  You have trouble breathing. This information is not intended to replace advice given to you by your health care provider. Make sure you discuss any questions you have with your health care provider. Document Released: 01/04/2005 Document Revised: 08/31/2015 Document Reviewed: 10/30/2014 Elsevier Interactive Patient Education  2017 [employer].    Steps to Quit Smoking Smoking tobacco can be harmful to your health and can affect almost every organ in your body. Smoking puts you, and those around you, at risk for developing many serious chronic diseases. Quitting smoking is difficult, but it is one of the best things that you can do for your health. It is never too late to quit. What are the benefits of quitting smoking? When you quit smoking, you lower your risk of developing serious diseases and conditions, such as:  Lung cancer or lung disease, such as COPD.  Heart disease.  Stroke.  Heart  attack.  Infertility.  Osteoporosis and bone fractures.  Additionally, symptoms such as coughing, wheezing, and shortness of breath may get better when you quit. You may also find that you get sick less often because your body is stronger at fighting off colds and infections. If you are pregnant, quitting smoking can help to reduce your chances of having a baby of low birth weight. How do I get ready to quit? When you decide to quit smoking, create a plan to make sure that you are successful. Before you quit:  Pick a date to quit. Set a date within the next two weeks to give you time to prepare.  Write down the reasons why you are quitting. Keep this list in places where you will see it often, such as on your bathroom mirror or in your car or wallet.  Identify the people, places, things, and activities that make you want to smoke (triggers) and avoid them. Make sure to take these actions: ? Throw away all cigarettes at home, at work, and in your car. ? Throw away smoking accessories, such as Scientist, research (medical). ? Clean your car and make sure to empty the ashtray. ? Clean your home, including curtains and carpets.  Tell your family, friends, and coworkers that you are quitting. Support from your loved ones can make quitting easier.  Talk with your health care provider about your options for quitting smoking.  Find out what treatment  options are covered by your health insurance.  What strategies can I use to quit smoking? Talk with your healthcare provider about different strategies to quit smoking. Some strategies include:  Quitting smoking altogether instead of gradually lessening how much you smoke over a period of time. Research shows that quitting "cold [place]" is more successful than gradually quitting.  Attending in-person counseling to help you build problem-solving skills. You are more likely to have success in quitting if you attend several counseling sessions. Even short  sessions of 10 minutes can be effective.  Finding resources and support systems that can help you to quit smoking and remain smoke-free after you quit. These resources are most helpful when you use them often. They can include: ? Online chats with a Social worker. ? Telephone quitlines. ? Careers information officer. ? Support groups or group counseling. ? Text messaging programs. ? Mobile phone applications.  Taking medicines to help you quit smoking. (If you are pregnant or breastfeeding, talk with your health care provider first.) Some medicines contain nicotine and some do not. Both types of medicines help with cravings, but the medicines that include nicotine help to relieve withdrawal symptoms. Your health care provider may recommend: ? Nicotine patches, gum, or lozenges. ? Nicotine inhalers or sprays. ? Non-nicotine medicine that is taken by mouth.  Talk with your health care provider about combining strategies, such as taking medicines while you are also receiving in-person counseling. Using these two strategies together makes you more likely to succeed in quitting than if you used either strategy on its own. If you are pregnant or breastfeeding, talk with your health care provider about finding counseling or other support strategies to quit smoking. Do not take medicine to help you quit smoking unless told to do so by your health care provider. What things can I do to make it easier to quit? Quitting smoking might feel overwhelming at first, but there is a lot that you can do to make it easier. Take these important actions:  Reach out to your family and friends and ask that they support and encourage you during this time. Call telephone quitlines, reach out to support groups, or work with a counselor for support.  Ask people who smoke to avoid smoking around you.  Avoid places that trigger you to smoke, such as bars, parties, or smoke-break areas at work.  Spend time around people who  do not smoke.  Lessen stress in your life, because stress can be a smoking trigger for some people. To lessen stress, try: ? Exercising regularly. ? Deep-breathing exercises. ? Yoga. ? Meditating. ? Performing a body scan. This involves closing your eyes, scanning your body from head to toe, and noticing which parts of your body are particularly tense. Purposefully relax the muscles in those areas.  Download or purchase mobile phone or tablet apps (applications) that can help you stick to your quit plan by providing reminders, tips, and encouragement. There are many free apps, such as QuitGuide from the [employer] Office manager for Disease Control and Prevention). You can find other support for quitting smoking (smoking cessation) through smokefree.gov and other websites.  How will I feel when I quit smoking? Within the first 24 hours of quitting smoking, you may start to feel some withdrawal symptoms. These symptoms are usually most noticeable 2-3 days after quitting, but they usually do not last beyond 2-3 weeks. Changes or symptoms that you might experience include:  Mood swings.  Restlessness, anxiety, or irritation.  Difficulty concentrating.  Dizziness.  Strong cravings for sugary foods in addition to nicotine.  Mild weight gain.  Constipation.  Nausea.  Coughing or a sore throat.  Changes in how your medicines work in your body.  A depressed mood.  Difficulty sleeping (insomnia).  After the first 2-3 weeks of quitting, you may start to notice more positive results, such as:  Improved sense of smell and taste.  Decreased coughing and sore throat.  Slower heart rate.  Lower blood pressure.  Clearer skin.  The ability to breathe more easily.  Fewer sick days.  Quitting smoking is very challenging for most people. Do not get discouraged if you are not successful the first time. Some people need to make many attempts to quit before they achieve long-term success. Do your  best to stick to your quit plan, and talk with your health care provider if you have any questions or concerns. This information is not intended to replace advice given to you by your health care provider. Make sure you discuss any questions you have with your health care provider. Document Released: 12/29/2000 Document Revised: 09/02/2015 Document Reviewed: 05/21/2014 Elsevier Interactive Patient Education  2017 [place] you healthy  Get these tests  Blood pressure- Have your blood pressure checked once a year by your healthcare provider.  Normal blood pressure is 120/80.  Weight- Have your body mass index (BMI) calculated to screen for obesity.  BMI is a measure of body fat based on height and weight. You can also calculate your own BMI at [employer]velBags.it.  Cholesterol- Have your cholesterol checked regularly starting at age 32, sooner may be necessary if you have diabetes, high blood pressure, if a family member developed heart diseases at an early age or if you smoke.   Chlamydia, HIV, and other sexual transmitted disease- Get screened each year until the age of 59 then within three months of each new sexual partner.  Diabetes- Have your blood sugar checked regularly if you have high blood pressure, high cholesterol, a family history of diabetes or if you are overweight.  Get these vaccines  Flu shot- Every fall.  Tetanus shot- Every 10 years.  Menactra- Single dose; prevents meningitis.  Take these steps  Don't smoke- If you do smoke, ask your healthcare provider about quitting. For tips on how to quit, go to www.smokefree.gov or call 1-800-QUIT-NOW.  Be physically active- Exercise 5 days a week for at least 30 minutes.  If you are not already physically active start slow and gradually work up to 30 minutes of moderate physical activity.  Examples of moderate activity include walking briskly, mowing the yard, dancing, swimming bicycling, etc.  Eat a  healthy diet- Eat a variety of healthy foods such as fruits, vegetables, low fat milk, low fat cheese, yogurt, lean meats, poultry, fish, beans, tofu, etc.  For more information on healthy eating, go to www.thenutritionsource.org  Drink alcohol in moderation- Limit alcohol intake two drinks or less a day.  Never drink and drive.  Dentist- Brush and floss teeth twice daily; visit your dentis twice a year.  Depression-Your emotional health is as important as your physical health.  If you're feeling down, losing interest in things you normally enjoy please talk with your healthcare provider.  Gun Safety- If you keep a gun in your home, keep it unloaded and with the safety lock on.  Bullets should be stored separately.  Helmet use- Always wear a helmet when riding a motorcycle, bicycle, rollerblading or skateboarding.  Safe sex- If you may be exposed to a sexually transmitted infection, use a condom  Seat belts- Seat bels can save your life; always wear one.  Smoke/Carbon Monoxide detectors- These detectors need to be installed on the appropriate level of your home.  Replace batteries at least once a year.  Skin Cancer- When out in the sun, cover up and use sunscreen SPF 15 or higher.  Violence- If anyone is threatening or hurting you, please tell your healthcare provider.   IF you received an x-ray today, you will receive an invoice from [facility]. Please contact [facility] at [phone] with questions or concerns regarding your invoice.   IF you received labwork today, you will receive an invoice from [place]. Please contact [facility] at [phone] with questions or concerns regarding your invoice.   Our billing staff will not be able to assist you with questions regarding bills from these companies.  You will be contacted with the lab results as soon as they are available. The fastest way to get your results is to activate your My Chart account. Instructions are  located on the last page of this paperwork. If you have not heard from [place] regarding the results in 2 weeks, please contact this office.       I personally performed the services described in this documentation, which was scribed in my presence. The recorded information has been reviewed and considered for accuracy and completeness, addended by me as needed, and agree with information above.  Signed,   Merri Ray, MD [facility] [place].  12/24/16 11:39 AM

## 2016-12-21 NOTE — Patient Instructions (Addendum)
Start saline nasal spray 4-5 times per day, drink plenty of fluids, then if sinus symptoms are not improving in next few days, start antibiotic.  Return to the clinic or go to the nearest emergency room if any of your symptoms worsen or new symptoms occur.  When you are ready to quit smoking, I am ready to help you.   Avoid late night meals and watch portions to help with weight loss.   Thanks for coming in today.    Sinusitis, Adult Sinusitis is soreness and inflammation of your sinuses. Sinuses are hollow spaces in the bones around your face. Your sinuses are located:  Around your eyes.  In the middle of your forehead.  Behind your nose.  In your cheekbones.  Your sinuses and nasal passages are lined with a stringy fluid (mucus). Mucus normally drains out of your sinuses. When your nasal tissues become inflamed or swollen, the mucus can become trapped or blocked so air cannot flow through your sinuses. This allows bacteria, viruses, and funguses to grow, which leads to infection. Sinusitis can develop quickly and last for 7?10 days (acute) or for more than 12 weeks (chronic). Sinusitis often develops after a cold. What are the causes? This condition is caused by anything that creates swelling in the sinuses or stops mucus from draining, including:  Allergies.  Asthma.  Bacterial or viral infection.  Abnormally shaped bones between the nasal passages.  Nasal growths that contain mucus (nasal polyps).  Narrow sinus openings.  Pollutants, such as chemicals or irritants in the air.  A foreign object stuck in the nose.  A fungal infection. This is rare.  What increases the risk? The following factors may make you more likely to develop this condition:  Having allergies or asthma.  Having had a recent cold or respiratory tract infection.  Having structural deformities or blockages in your nose or sinuses.  Having a weak immune system.  Doing a lot of swimming or  diving.  Overusing nasal sprays.  Smoking.  What are the signs or symptoms? The main symptoms of this condition are pain and a feeling of pressure around the affected sinuses. Other symptoms include:  Upper toothache.  Earache.  Headache.  Bad breath.  Decreased sense of smell and taste.  A cough that may get worse at night.  Fatigue.  Fever.  Thick drainage from your nose. The drainage is often green and it may contain pus (purulent).  Stuffy nose or congestion.  Postnasal drip. This is when extra mucus collects in the throat or back of the nose.  Swelling and warmth over the affected sinuses.  Sore throat.  Sensitivity to light.  How is this diagnosed? This condition is diagnosed based on symptoms, a medical history, and a physical exam. To find out if your condition is acute or chronic, your health care provider may:  Look in your nose for signs of nasal polyps.  Tap over the affected sinus to check for signs of infection.  View the inside of your sinuses using an imaging device that has a light attached (endoscope).  If your health care provider suspects that you have chronic sinusitis, you may also:  Be tested for allergies.  Have a sample of mucus taken from your nose (nasal culture) and checked for bacteria.  Have a mucus sample examined to see if your sinusitis is related to an allergy.  If your sinusitis does not respond to treatment and it lasts longer than 8 weeks, you may have  an MRI or CT scan to check your sinuses. These scans also help to determine how severe your infection is. In rare cases, a bone biopsy may be done to rule out more serious types of fungal sinus disease. How is this treated? Treatment for sinusitis depends on the cause and whether your condition is chronic or acute. If a virus is causing your sinusitis, your symptoms will go away on their own within 10 days. You may be given medicines to relieve your symptoms,  including:  Topical nasal decongestants. They shrink swollen nasal passages and let mucus drain from your sinuses.  Antihistamines. These drugs block inflammation that is triggered by allergies. This can help to ease swelling in your nose and sinuses.  Topical nasal corticosteroids. These are nasal sprays that ease inflammation and swelling in your nose and sinuses.  Nasal saline washes. These rinses can help to get rid of thick mucus in your nose.  If your condition is caused by bacteria, you will be given an antibiotic medicine. If your condition is caused by a fungus, you will be given an antifungal medicine. Surgery may be needed to correct underlying conditions, such as narrow nasal passages. Surgery may also be needed to remove polyps. Follow these instructions at home: Medicines  Take, use, or apply over-the-counter and prescription medicines only as told by your health care provider. These may include nasal sprays.  If you were prescribed an antibiotic medicine, take it as told by your health care provider. Do not stop taking the antibiotic even if you start to feel better. Hydrate and Humidify  Drink enough water to keep your urine clear or pale yellow. Staying hydrated will help to thin your mucus.  Use a cool mist humidifier to keep the humidity level in your home above 50%.  Inhale steam for 10-15 minutes, 3-4 times a day or as told by your health care provider. You can do this in the bathroom while a hot shower is running.  Limit your exposure to cool or dry air. Rest  Rest as much as possible.  Sleep with your head raised (elevated).  Make sure to get enough sleep each night. General instructions  Apply a warm, moist washcloth to your face 3-4 times a day or as told by your health care provider. This will help with discomfort.  Wash your hands often with soap and water to reduce your exposure to viruses and other germs. If soap and water are not available, use hand  sanitizer.  Do not smoke. Avoid being around people who are smoking (secondhand smoke).  Keep all follow-up visits as told by your health care provider. This is important. Contact a health care provider if:  You have a fever.  Your symptoms get worse.  Your symptoms do not improve within 10 days. Get help right away if:  You have a severe headache.  You have persistent vomiting.  You have pain or swelling around your face or eyes.  You have vision problems.  You develop confusion.  Your neck is stiff.  You have trouble breathing. This information is not intended to replace advice given to you by your health care provider. Make sure you discuss any questions you have with your health care provider. Document Released: 01/04/2005 Document Revised: 08/31/2015 Document Reviewed: 10/30/2014 Elsevier Interactive Patient Education  2017 [employer].    Steps to Quit Smoking Smoking tobacco can be harmful to your health and can affect almost every organ in your body. Smoking puts you,  and those around you, at risk for developing many serious chronic diseases. Quitting smoking is difficult, but it is one of the best things that you can do for your health. It is never too late to quit. What are the benefits of quitting smoking? When you quit smoking, you lower your risk of developing serious diseases and conditions, such as:  Lung cancer or lung disease, such as COPD.  Heart disease.  Stroke.  Heart attack.  Infertility.  Osteoporosis and bone fractures.  Additionally, symptoms such as coughing, wheezing, and shortness of breath may get better when you quit. You may also find that you get sick less often because your body is stronger at fighting off colds and infections. If you are pregnant, quitting smoking can help to reduce your chances of having a baby of low birth weight. How do I get ready to quit? When you decide to quit smoking, create a plan to make sure that you  are successful. Before you quit:  Pick a date to quit. Set a date within the next two weeks to give you time to prepare.  Write down the reasons why you are quitting. Keep this list in places where you will see it often, such as on your bathroom mirror or in your car or wallet.  Identify the people, places, things, and activities that make you want to smoke (triggers) and avoid them. Make sure to take these actions: ? Throw away all cigarettes at home, at work, and in your car. ? Throw away smoking accessories, such as Scientist, research (medical). ? Clean your car and make sure to empty the ashtray. ? Clean your home, including curtains and carpets.  Tell your family, friends, and coworkers that you are quitting. Support from your loved ones can make quitting easier.  Talk with your health care provider about your options for quitting smoking.  Find out what treatment options are covered by your health insurance.  What strategies can I use to quit smoking? Talk with your healthcare provider about different strategies to quit smoking. Some strategies include:  Quitting smoking altogether instead of gradually lessening how much you smoke over a period of time. Research shows that quitting "cold [place]" is more successful than gradually quitting.  Attending in-person counseling to help you build problem-solving skills. You are more likely to have success in quitting if you attend several counseling sessions. Even short sessions of 10 minutes can be effective.  Finding resources and support systems that can help you to quit smoking and remain smoke-free after you quit. These resources are most helpful when you use them often. They can include: ? Online chats with a Social worker. ? Telephone quitlines. ? Careers information officer. ? Support groups or group counseling. ? Text messaging programs. ? Mobile phone applications.  Taking medicines to help you quit smoking. (If you are pregnant or  breastfeeding, talk with your health care provider first.) Some medicines contain nicotine and some do not. Both types of medicines help with cravings, but the medicines that include nicotine help to relieve withdrawal symptoms. Your health care provider may recommend: ? Nicotine patches, gum, or lozenges. ? Nicotine inhalers or sprays. ? Non-nicotine medicine that is taken by mouth.  Talk with your health care provider about combining strategies, such as taking medicines while you are also receiving in-person counseling. Using these two strategies together makes you more likely to succeed in quitting than if you used either strategy on its own. If you are pregnant or breastfeeding,  talk with your health care provider about finding counseling or other support strategies to quit smoking. Do not take medicine to help you quit smoking unless told to do so by your health care provider. What things can I do to make it easier to quit? Quitting smoking might feel overwhelming at first, but there is a lot that you can do to make it easier. Take these important actions:  Reach out to your family and friends and ask that they support and encourage you during this time. Call telephone quitlines, reach out to support groups, or work with a counselor for support.  Ask people who smoke to avoid smoking around you.  Avoid places that trigger you to smoke, such as bars, parties, or smoke-break areas at work.  Spend time around people who do not smoke.  Lessen stress in your life, because stress can be a smoking trigger for some people. To lessen stress, try: ? Exercising regularly. ? Deep-breathing exercises. ? Yoga. ? Meditating. ? Performing a body scan. This involves closing your eyes, scanning your body from head to toe, and noticing which parts of your body are particularly tense. Purposefully relax the muscles in those areas.  Download or purchase mobile phone or tablet apps (applications) that can  help you stick to your quit plan by providing reminders, tips, and encouragement. There are many free apps, such as QuitGuide from the [employer] Office manager for Disease Control and Prevention). You can find other support for quitting smoking (smoking cessation) through smokefree.gov and other websites.  How will I feel when I quit smoking? Within the first 24 hours of quitting smoking, you may start to feel some withdrawal symptoms. These symptoms are usually most noticeable 2-3 days after quitting, but they usually do not last beyond 2-3 weeks. Changes or symptoms that you might experience include:  Mood swings.  Restlessness, anxiety, or irritation.  Difficulty concentrating.  Dizziness.  Strong cravings for sugary foods in addition to nicotine.  Mild weight gain.  Constipation.  Nausea.  Coughing or a sore throat.  Changes in how your medicines work in your body.  A depressed mood.  Difficulty sleeping (insomnia).  After the first 2-3 weeks of quitting, you may start to notice more positive results, such as:  Improved sense of smell and taste.  Decreased coughing and sore throat.  Slower heart rate.  Lower blood pressure.  Clearer skin.  The ability to breathe more easily.  Fewer sick days.  Quitting smoking is very challenging for most people. Do not get discouraged if you are not successful the first time. Some people need to make many attempts to quit before they achieve long-term success. Do your best to stick to your quit plan, and talk with your health care provider if you have any questions or concerns. This information is not intended to replace advice given to you by your health care provider. Make sure you discuss any questions you have with your health care provider. Document Released: 12/29/2000 Document Revised: 09/02/2015 Document Reviewed: 05/21/2014 Elsevier Interactive Patient Education  2017 [place] you healthy  Get these  tests  Blood pressure- Have your blood pressure checked once a year by your healthcare provider.  Normal blood pressure is 120/80.  Weight- Have your body mass index (BMI) calculated to screen for obesity.  BMI is a measure of body fat based on height and weight. You can also calculate your own BMI at [employer].it.  Cholesterol- Have your cholesterol checked regularly  starting at age 50, sooner may be necessary if you have diabetes, high blood pressure, if a family member developed heart diseases at an early age or if you smoke.   Chlamydia, HIV, and other sexual transmitted disease- Get screened each year until the age of 38 then within three months of each new sexual partner.  Diabetes- Have your blood sugar checked regularly if you have high blood pressure, high cholesterol, a family history of diabetes or if you are overweight.  Get these vaccines  Flu shot- Every fall.  Tetanus shot- Every 10 years.  Menactra- Single dose; prevents meningitis.  Take these steps  Don't smoke- If you do smoke, ask your healthcare provider about quitting. For tips on how to quit, go to www.smokefree.gov or call 1-800-QUIT-NOW.  Be physically active- Exercise 5 days a week for at least 30 minutes.  If you are not already physically active start slow and gradually work up to 30 minutes of moderate physical activity.  Examples of moderate activity include walking briskly, mowing the yard, dancing, swimming bicycling, etc.  Eat a healthy diet- Eat a variety of healthy foods such as fruits, vegetables, low fat milk, low fat cheese, yogurt, lean meats, poultry, fish, beans, tofu, etc.  For more information on healthy eating, go to www.thenutritionsource.org  Drink alcohol in moderation- Limit alcohol intake two drinks or less a day.  Never drink and drive.  Dentist- Brush and floss teeth twice daily; visit your dentis twice a year.  Depression-Your emotional health is as important as your  physical health.  If you're feeling down, losing interest in things you normally enjoy please talk with your healthcare provider.  Gun Safety- If you keep a gun in your home, keep it unloaded and with the safety lock on.  Bullets should be stored separately.  Helmet use- Always wear a helmet when riding a motorcycle, bicycle, rollerblading or skateboarding.  Safe sex- If you may be exposed to a sexually transmitted infection, use a condom  Seat belts- Seat bels can save your life; always wear one.  Smoke/Carbon Monoxide detectors- These detectors need to be installed on the appropriate level of your home.  Replace batteries at least once a year.  Skin Cancer- When out in the sun, cover up and use sunscreen SPF 15 or higher.  Violence- If anyone is threatening or hurting you, please tell your healthcare provider.   IF you received an x-ray today, you will receive an invoice from [facility]. Please contact [facility] at [phone] with questions or concerns regarding your invoice.   IF you received labwork today, you will receive an invoice from [place]. Please contact [facility] at [phone] with questions or concerns regarding your invoice.   Our billing staff will not be able to assist you with questions regarding bills from these companies.  You will be contacted with the lab results as soon as they are available. The fastest way to get your results is to activate your My Chart account. Instructions are located on the last page of this paperwork. If you have not heard from [place] regarding the results in 2 weeks, please contact this office.

## 2016-12-22 LAB — COMPREHENSIVE METABOLIC PANEL
A/G RATIO: 1.8 (ref 1.2–2.2)
ALBUMIN: 4.9 g/dL (ref 3.5–5.5)
ALT: 32 IU/L (ref 0–44)
AST: 27 IU/L (ref 0–40)
Alkaline Phosphatase: 83 IU/L (ref 39–117)
BUN / CREAT RATIO: 13 (ref 9–20)
BUN: 13 mg/dL (ref 6–20)
Bilirubin Total: 0.4 mg/dL (ref 0.0–1.2)
CALCIUM: 9.8 mg/dL (ref 8.7–10.2)
CO2: 21 mmol/L (ref 20–29)
Chloride: 105 mmol/L (ref 96–106)
Creatinine, Ser: 1.02 mg/dL (ref 0.76–1.27)
GFR, EST AFRICAN AMERICAN: 110 mL/min/{1.73_m2} (ref >59)
GFR, EST NON AFRICAN AMERICAN: 95 mL/min/{1.73_m2} (ref >59)
GLOBULIN, TOTAL: 2.8 g/dL (ref 1.5–4.5)
Glucose: 81 mg/dL (ref 65–99)
POTASSIUM: 4.4 mmol/L (ref 3.5–5.2)
Sodium: 145 mmol/L — ABNORMAL HIGH (ref 134–144)
TOTAL PROTEIN: 7.7 g/dL (ref 6.0–8.5)

## 2016-12-22 LAB — LIPID PANEL
CHOL/HDL RATIO: 4.2 ratio (ref 0.0–5.0)
CHOLESTEROL TOTAL: 165 mg/dL (ref 100–199)
HDL: 39 mg/dL — AB (ref >39)
LDL Calculated: 105 mg/dL — ABNORMAL HIGH (ref 0–99)
TRIGLYCERIDES: 104 mg/dL (ref 0–149)
VLDL Cholesterol Cal: 21 mg/dL (ref 5–40)

## 2016-12-22 LAB — HIV ANTIBODY (ROUTINE TESTING W REFLEX): HIV Screen 4th Generation wRfx: NONREACTIVE

## 2016-12-28 ENCOUNTER — Telehealth: Payer: Self-pay | Admitting: *Deleted

## 2016-12-28 NOTE — Telephone Encounter (Signed)
Left detailed message with lab results

## 2016-12-30 NOTE — Progress Notes (Signed)
Informed patient of results

## 2017-06-21 ENCOUNTER — Encounter: Payer: Self-pay | Admitting: Family Medicine

## 2017-06-21 ENCOUNTER — Ambulatory Visit: Payer: 59 | Admitting: Family Medicine

## 2017-06-21 VITALS — BP 112/64 | HR 77 | Temp 97.9°F | Ht 70.5 in | Wt 273.4 lb

## 2017-06-21 DIAGNOSIS — I10 Essential (primary) hypertension: Secondary | ICD-10-CM | POA: Diagnosis not present

## 2017-06-21 DIAGNOSIS — E785 Hyperlipidemia, unspecified: Secondary | ICD-10-CM | POA: Diagnosis not present

## 2017-06-21 DIAGNOSIS — Z6838 Body mass index (BMI) 38.0-38.9, adult: Secondary | ICD-10-CM

## 2017-06-21 DIAGNOSIS — L720 Epidermal cyst: Secondary | ICD-10-CM | POA: Diagnosis not present

## 2017-06-21 MED ORDER — LISINOPRIL 5 MG PO TABS: 5.0000 mg | ORAL_TABLET | Freq: Every day | ORAL | 2 refills | Status: DC

## 2017-06-21 NOTE — Progress Notes (Signed)
Subjective:  By signing my name below, I, Bradley Morales, attest that this documentation has been prepared under the direction and in the presence of Bradley Agreste, MD Electronically Signed: Ladene Artist, ED Scribe 06/21/2017 at 8:17 AM.   Patient ID: Bradley Morales Notarianni, male    DOB: 1980-12-19, 37 y.o.   MRN: [id number]  Chief Complaint  Patient presents with  . Chronic Conditions    6 m follow up    HPI Bradley Morales Guaman is a 37 y.o. male who presents to Primary Care at [facility] for f/u. Pt had a piece of toast around 6 AM today.  HTN BP Readings from Last 3 Encounters:  06/21/17 112/64  12/21/16 132/78  06/10/16 122/84   Lab Results  Component Value Date   CREATININE 1.02 12/21/2016  Lisinopril 5 mg qd. Continued on same at Dec visit. - Denies new side-effects. He has not tried checking his BP off the meds.  Obesity Wt Readings from Last 3 Encounters:  06/21/17 273 lb 6.4 oz (124 kg)  12/21/16 278 lb (126.1 kg)  06/10/16 282 lb 12.8 oz (128.3 kg)  Body mass index is 38.67 kg/m.  Discussed portion controls, avoiding late night meals and exercise at last visit. 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 Denies consuming fast foods, soda or sweet tea lately. Also states he has eliminated late night meals.  Hyperlipidemia Lab Results  Component Value Date   CHOL 165 12/21/2016   HDL 39 (L) 12/21/2016   LDLCALC 105 (H) 12/21/2016   TRIG 104 12/21/2016   CHOLHDL 4.2 12/21/2016   Lab Results  Component Value Date   ALT 32 12/21/2016   AST 27 12/21/2016   ALKPHOS 83 12/21/2016   BILITOT 0.4 12/21/2016  Recommended diet and exercise approach initially.  Cyst Pt reports a gradually enlarging mass to the L neck over the past few yrs. Denies pain but states that area becomes irritated when it rubs his shirt.  Patient Active Problem List   Diagnosis Date Noted  . Obesity due to excess calories   . Snoring disorder   . HTN  (hypertension) 08/30/2012  . Palpitations 04/06/2012  . OSA (obstructive sleep apnea) 03/06/2012  . Nicotine addiction 11/05/2011  . BMI 45.0-49.9, adult ([place]) 11/05/2011   Past Medical History:  Diagnosis Date  . Bell's palsy   . Hypertension   . Obesity due to excess calories   . OSA (obstructive sleep apnea)   . Snoring disorder    Past Surgical History:  Procedure Laterality Date  . VASECTOMY     No Known Allergies Prior to Admission medications   Medication Sig Start Date End Date Taking? Authorizing Provider  amoxicillin-clavulanate (AUGMENTIN) 875-125 MG tablet Take 1 tablet by mouth 2 (two) times daily. 12/21/16   Bradley Agreste, MD  Ascorbic Acid (VITAMIN Morales) 1000 MG tablet Take 1,000 mg by mouth daily.    [provider]  lisinopril (PRINIVIL,ZESTRIL) 5 MG tablet Take 1 tablet (5 mg total) by mouth daily. 12/21/16   Bradley Agreste, MD   Social History   Socioeconomic History  . Marital status: Married    Spouse name: Bradley Morales  . Number of children: 1  . Years of education: 71  . Highest education level: Not on file  Occupational History  . 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 Occupation: Music therapist: [employer]  Social Needs  . Financial resource strain: Not on file  . Food insecurity:    Worry: Not on file    Inability: Not on file  . Transportation needs:    Medical: Not on file    Non-medical: Not on file  Tobacco Use  . Smoking status: Current Every Day Smoker    Types: Cigarettes    Last attempt to quit: 02/19/2012    Years since quitting: 5.3  . Smokeless tobacco: Never Used  . Tobacco comment: uses an electronic cigarette  Substance and Sexual Activity  . Alcohol use: Yes  . Drug use: No  . Sexual activity: Yes    Birth control/protection: None    Comment: 1 partner in last 12 months  Lifestyle  . Physical activity:    Days per week: Not on file    Minutes per session: Not on file  .  Stress: Not on file  Relationships  . Social connections:    Talks on phone: Not on file    Gets together: Not on file    Attends religious service: Not on file    Active member of club or organization: Not on file    Attends meetings of clubs or organizations: Not on file    Relationship status: Not on file  . Intimate partner violence:    Fear of current or ex partner: Not on file    Emotionally abused: Not on file    Physically abused: Not on file    Forced sexual activity: Not on file  Other Topics Concern  . Not on file  Social History Narrative   Patient is married Programme researcher, broadcasting/film/video) and lives at home with his family.   Patient has one child.   Patient is working full-time.   Patient has a high school education.   Patient is right-handed.   Patient drinks 3 cups of soda per day.   Review of Systems  Constitutional: Negative for fatigue and unexpected weight change.  Eyes: Negative for visual disturbance.  Respiratory: Negative for cough, chest tightness and shortness of breath.   Cardiovascular: Negative for chest pain, palpitations and leg swelling.  Gastrointestinal: Negative for abdominal pain and blood in stool.  Skin:       + Mass on L Neck  Neurological: Negative for dizziness, light-headedness and headaches.      Objective:   Physical Exam  Constitutional: He is oriented to person, place, and time. He appears well-developed and well-nourished.  HENT:  Head: Normocephalic and atraumatic.  Eyes: Pupils are equal, round, and reactive to light. EOM are normal.  Neck: No JVD present. Carotid bruit is not present.  Cardiovascular: Normal rate, regular rhythm and normal heart sounds.  No murmur heard. Pulmonary/Chest: Effort normal and breath sounds normal. He has no rales.  Musculoskeletal: He exhibits no edema.  Neurological: He is alert and oriented to person, place, and time.  Skin: Skin is warm and dry.  Suspected sebaceous cyst vs lipoma in L posterior neck. No  apparent signs of infection.  Psychiatric: He has a normal mood and affect.  Vitals reviewed.  Vitals:   06/21/17 0801  BP: 112/64  Pulse: 77  Temp: 97.9 F (36.6 Morales)  TempSrc: Oral  SpO2: 99%  Weight: 273 lb 6.4 oz (124 kg)  Height: 5' 10.5" (1.791 m)      Assessment & Plan:   Bradley Morales Withers is a 37 y.o. male BMI 38.0-38.9,adult  -Continue exercise, watching diet and portion control.  Option of  meeting with nutritionist or bariatric specialist if he would like, but plans on continuing current plan.  Commended on some weight loss since last visit.  Essential hypertension - Plan: lisinopril (PRINIVIL,ZESTRIL) 5 MG tablet  -Controlled, and actually lower level.  May need lisinopril at this point with weight loss.  Option of coming off medication with close monitoring of BP to determine if restart needed.  Hyperlipidemia, unspecified hyperlipidemia type - Plan: Comprehensive metabolic panel, Lipid panel  -Borderline elevated previously, suspect that will improve with his weight loss.  No meds for now.  Plans to return for fasting lab work  Epidermal cyst of neck - Plan: Ambulatory referral to General Surgery  -Suspected cyst versus lipoma.  Does report some increased size past few years.  Will refer to general surgeon to discuss excision.  Meds ordered this encounter  Medications  . lisinopril (PRINIVIL,ZESTRIL) 5 MG tablet    Sig: Take 1 tablet (5 mg total) by mouth daily.    Dispense:  90 tablet    Refill:  2   Patient Instructions   Continue to work on exercise and portion control to help with weight loss. As weight continues to improve, can try coming off the lisinopril.  Keep a record of your blood pressures outside of the office and if readings over 140/90 - restart meds.   Return for fasting labs in next week if possible - 8 hours fasting.   I will refer you to surgeon to discuss the area on your neck.    IF you received an x-ray today, you will receive an invoice from  [facility]. Please contact [facility] at [phone] with questions or concerns regarding your invoice.   IF you received labwork today, you will receive an invoice from [place]. Please contact [facility] at [phone] with questions or concerns regarding your invoice.   Our billing staff will not be able to assist you with questions regarding bills from these companies.  You will be contacted with the lab results as soon as they are available. The fastest way to get your results is to activate your My Chart account. Instructions are located on the last page of this paperwork. If you have not heard from [place] regarding the results in 2 weeks, please contact this office.       I personally performed the services described in this documentation, which was scribed in my presence. The recorded information has been reviewed and considered for accuracy and completeness, addended by me as needed, and agree with information above.  Signed,   Merri Ray, MD Primary [facility] at [facility].  06/21/17 8:29 AM

## 2017-06-21 NOTE — Patient Instructions (Addendum)
Continue to work on exercise and portion control to help with weight loss. As weight continues to improve, can try coming off the lisinopril.  Keep a record of your blood pressures outside of the office and if readings over 140/90 - restart meds.   Return for fasting labs in next week if possible - 8 hours fasting.   I will refer you to surgeon to discuss the area on your neck.    IF you received an x-ray today, you will receive an invoice from [facility]. Please contact [facility] at [phone] with questions or concerns regarding your invoice.   IF you received labwork today, you will receive an invoice from [place]. Please contact [facility] at [phone] with questions or concerns regarding your invoice.   Our billing staff will not be able to assist you with questions regarding bills from these companies.  You will be contacted with the lab results as soon as they are available. The fastest way to get your results is to activate your My Chart account. Instructions are located on the last page of this paperwork. If you have not heard from [place] regarding the results in 2 weeks, please contact this office.

## 2017-06-25 ENCOUNTER — Ambulatory Visit (INDEPENDENT_AMBULATORY_CARE_PROVIDER_SITE_OTHER): Payer: 59 | Admitting: Family Medicine

## 2017-06-25 DIAGNOSIS — E785 Hyperlipidemia, unspecified: Secondary | ICD-10-CM | POA: Diagnosis not present

## 2017-06-25 NOTE — Progress Notes (Signed)
Lab only visit 

## 2017-06-26 LAB — COMPREHENSIVE METABOLIC PANEL
ALK PHOS: 73 IU/L (ref 39–117)
ALT: 16 IU/L (ref 0–44)
AST: 17 IU/L (ref 0–40)
Albumin/Globulin Ratio: 1.9 (ref 1.2–2.2)
Albumin: 4.3 g/dL (ref 3.5–5.5)
BUN/Creatinine Ratio: 10 (ref 9–20)
BUN: 10 mg/dL (ref 6–20)
Bilirubin Total: 0.5 mg/dL (ref 0.0–1.2)
CALCIUM: 9.2 mg/dL (ref 8.7–10.2)
CO2: 22 mmol/L (ref 20–29)
CREATININE: 0.98 mg/dL (ref 0.76–1.27)
Chloride: 104 mmol/L (ref 96–106)
GFR calc Af Amer: 115 mL/min/{1.73_m2} (ref >59)
GFR calc non Af Amer: 99 mL/min/{1.73_m2} (ref >59)
GLUCOSE: 89 mg/dL (ref 65–99)
Globulin, Total: 2.3 g/dL (ref 1.5–4.5)
Potassium: 4.6 mmol/L (ref 3.5–5.2)
Sodium: 140 mmol/L (ref 134–144)
Total Protein: 6.6 g/dL (ref 6.0–8.5)

## 2017-06-26 LAB — LIPID PANEL
Chol/HDL Ratio: 4.7 ratio (ref 0.0–5.0)
Cholesterol, Total: 145 mg/dL (ref 100–199)
HDL: 31 mg/dL — ABNORMAL LOW (ref >39)
LDL CALC: 90 mg/dL (ref 0–99)
Triglycerides: 120 mg/dL (ref 0–149)
VLDL CHOLESTEROL CAL: 24 mg/dL (ref 5–40)

## 2017-08-08 DIAGNOSIS — L729 Follicular cyst of the skin and subcutaneous tissue, unspecified: Secondary | ICD-10-CM | POA: Diagnosis not present

## 2017-09-03 ENCOUNTER — Ambulatory Visit: Payer: Self-pay | Admitting: General Surgery

## 2017-09-22 NOTE — Pre-Procedure Instructions (Signed)
Mali C Bebo  09/22/2017      [id number] [place],  [phone] [facility] [address] AT [place] [address] [address] Phone: [phone] Fax: [phone]  [facility] [place], [place] [address] AT [place] [address] Phone: [phone] Fax: [phone]   [place], [place] - [address] [place] [address] Phone: [phone] Fax: [phone]    Your procedure is scheduled on Friday September 13th.  Report to [facility] Admitting at 0900 A.M.  Call this number if you have problems the morning of surgery:  [phone]   Remember:  Do not eat or drink after midnight.    Do not take any medications the day of surgery.    Do not wear jewelry.  Do not wear lotions, powders, or colognes, or deodorant.  Do not shave 48 hours prior to surgery.  Men may shave face and neck.  Do not bring valuables to the hospital.  [facility] is not responsible for any belongings or valuables.  Contacts, dentures or bridgework may not be worn into surgery.  Leave your suitcase in the car.  After surgery it may be brought to your room.  For patients admitted to the hospital, discharge time will be determined by your treatment team.  Patients discharged the day of surgery will not be allowed to drive home.    [place]- Preparing For Surgery  Before surgery, you can play an important role. Because skin is not sterile, your skin needs to be as free of germs as possible. You can reduce the number of germs on your skin by washing with CHG (chlorahexidine gluconate) Soap before surgery.  CHG is an antiseptic cleaner which kills germs and bonds with the skin to continue killing germs even after washing.    Oral Hygiene is also important to reduce your risk of infection.  Remember - BRUSH YOUR  TEETH THE MORNING OF SURGERY WITH YOUR REGULAR TOOTHPASTE  Please do not use if you have an allergy to CHG or antibacterial soaps. If your skin becomes reddened/irritated stop using the CHG.  Do not shave (including legs and underarms) for at least 48 hours prior to first CHG shower. It is OK to shave your face.  Please follow these instructions carefully.   1. Shower the NIGHT BEFORE SURGERY and the MORNING OF SURGERY with CHG.   2. If you chose to wash your hair, wash your hair first as usual with your normal shampoo.  3. After you shampoo, rinse your hair and body thoroughly to remove the shampoo.  4. Use CHG as you would any other liquid soap. You can apply CHG directly to the skin and wash gently with a scrungie or a clean washcloth.   5. Apply the CHG Soap to your body ONLY FROM THE NECK DOWN.  Do not use on open wounds or open sores. Avoid contact with your eyes, ears, mouth and genitals (private parts). Wash Face and genitals (private parts)  with your normal soap.  6. Wash thoroughly, paying special attention to the area where your surgery will be performed.  7. Thoroughly rinse your body with warm water from the neck down.  8. DO NOT shower/wash with your normal soap after using and rinsing off the CHG Soap.  9. Pat yourself dry with a CLEAN TOWEL.  10. Wear CLEAN PAJAMAS to bed the night before surgery, wear comfortable clothes the morning of surgery  11. Place CLEAN SHEETS on your bed the night of your first shower and DO NOT SLEEP WITH PETS.    Day of Surgery:  Do not apply any deodorants/lotions.  Please wear clean clothes to the hospital/surgery center.   Remember to brush your teeth WITH YOUR REGULAR TOOTHPASTE.    Please read over the following fact sheets that you were given.

## 2017-09-23 ENCOUNTER — Other Ambulatory Visit: Payer: Self-pay

## 2017-09-23 ENCOUNTER — Encounter (HOSPITAL_COMMUNITY): Payer: Self-pay

## 2017-09-23 ENCOUNTER — Encounter (HOSPITAL_COMMUNITY)
Admission: RE | Admit: 2017-09-23 | Discharge: 2017-09-23 | Disposition: A | Discharge status: Home / Self Care | Payer: 59 | Source: Ambulatory Visit | Attending: General Surgery | Admitting: General Surgery

## 2017-09-23 DIAGNOSIS — Z01818 Encounter for other preprocedural examination: Secondary | ICD-10-CM | POA: Diagnosis not present

## 2017-09-23 LAB — CBC
HCT: 49.3 % (ref 39.0–52.0)
Hemoglobin: 15.9 g/dL (ref 13.0–17.0)
MCH: 28.4 pg (ref 26.0–34.0)
MCHC: 32.3 g/dL (ref 30.0–36.0)
MCV: 88.2 fL (ref 78.0–100.0)
Platelets: 220 10*3/uL (ref 150–400)
RBC: 5.59 MIL/uL (ref 4.22–5.81)
RDW: 12.6 % (ref 11.5–15.5)
WBC: 8.8 10*3/uL (ref 4.0–10.5)

## 2017-09-23 LAB — BASIC METABOLIC PANEL
Anion gap: 9 (ref 5–15)
BUN: 9 mg/dL (ref 6–20)
CO2: 24 mmol/L (ref 22–32)
CREATININE: 1.19 mg/dL (ref 0.61–1.24)
Calcium: 9 mg/dL (ref 8.9–10.3)
Chloride: 107 mmol/L (ref 98–111)
GFR calc Af Amer: >60 mL/min (ref >60)
GFR calc non Af Amer: >60 mL/min (ref >60)
GLUCOSE: 90 mg/dL (ref 70–99)
Potassium: 3.8 mmol/L (ref 3.5–5.1)
Sodium: 140 mmol/L (ref 135–145)

## 2017-09-23 NOTE — Progress Notes (Signed)
PCP  Merri Ray MD  Denies any cardiac history.Never been seen by Cardiologist  Denies any cardiac testing.  Had Sleep Study done 2015  Negative for OSA Pt. States that he did snore loudly but has since lost weight and no longer snores.

## 2017-09-29 MED ORDER — DEXTROSE 5 % IV SOLN
3.0000 g | INTRAVENOUS | Status: AC
  Administered 2017-09-30: 3 g via INTRAVENOUS
  Filled 2017-09-29: qty 3

## 2017-09-30 ENCOUNTER — Ambulatory Visit (HOSPITAL_COMMUNITY)
Admission: RE | Admit: 2017-09-30 | Discharge: 2017-09-30 | Disposition: A | Discharge status: Home / Self Care | Payer: 59 | Source: Ambulatory Visit | Attending: General Surgery | Admitting: General Surgery

## 2017-09-30 ENCOUNTER — Ambulatory Visit (HOSPITAL_COMMUNITY): Payer: 59 | Admitting: Certified Registered"

## 2017-09-30 ENCOUNTER — Encounter (HOSPITAL_COMMUNITY)
Admission: RE | Disposition: A | Discharge status: Home / Self Care | Payer: Self-pay | Source: Ambulatory Visit | Attending: General Surgery

## 2017-09-30 DIAGNOSIS — Z8249 Family history of ischemic heart disease and other diseases of the circulatory system: Secondary | ICD-10-CM | POA: Insufficient documentation

## 2017-09-30 DIAGNOSIS — F17200 Nicotine dependence, unspecified, uncomplicated: Secondary | ICD-10-CM | POA: Insufficient documentation

## 2017-09-30 DIAGNOSIS — G4730 Sleep apnea, unspecified: Secondary | ICD-10-CM | POA: Insufficient documentation

## 2017-09-30 DIAGNOSIS — L720 Epidermal cyst: Secondary | ICD-10-CM | POA: Insufficient documentation

## 2017-09-30 DIAGNOSIS — I10 Essential (primary) hypertension: Secondary | ICD-10-CM | POA: Insufficient documentation

## 2017-09-30 DIAGNOSIS — Z8261 Family history of arthritis: Secondary | ICD-10-CM | POA: Insufficient documentation

## 2017-09-30 DIAGNOSIS — L729 Follicular cyst of the skin and subcutaneous tissue, unspecified: Secondary | ICD-10-CM | POA: Diagnosis not present

## 2017-09-30 DIAGNOSIS — D171 Benign lipomatous neoplasm of skin and subcutaneous tissue of trunk: Secondary | ICD-10-CM | POA: Diagnosis not present

## 2017-09-30 DIAGNOSIS — Z833 Family history of diabetes mellitus: Secondary | ICD-10-CM | POA: Diagnosis not present

## 2017-09-30 DIAGNOSIS — G4733 Obstructive sleep apnea (adult) (pediatric): Secondary | ICD-10-CM | POA: Diagnosis not present

## 2017-09-30 DIAGNOSIS — M549 Dorsalgia, unspecified: Secondary | ICD-10-CM | POA: Diagnosis not present

## 2017-09-30 HISTORY — PX: CYST EXCISION: SHX5701

## 2017-09-30 SURGERY — CYST REMOVAL
Anesthesia: General | Site: Back

## 2017-09-30 MED ORDER — ARTIFICIAL TEARS OPHTHALMIC OINT
TOPICAL_OINTMENT | OPHTHALMIC | Status: DC | PRN
  Administered 2017-09-30: 1 via OPHTHALMIC

## 2017-09-30 MED ORDER — ROCURONIUM BROMIDE 50 MG/5ML IV SOSY
PREFILLED_SYRINGE | INTRAVENOUS | Status: AC
  Filled 2017-09-30: qty 5

## 2017-09-30 MED ORDER — ONDANSETRON HCL 4 MG/2ML IJ SOLN
INTRAMUSCULAR | Status: DC | PRN
  Administered 2017-09-30: 4 mg via INTRAVENOUS

## 2017-09-30 MED ORDER — SUCCINYLCHOLINE CHLORIDE 200 MG/10ML IV SOSY
PREFILLED_SYRINGE | INTRAVENOUS | Status: AC
  Filled 2017-09-30: qty 10

## 2017-09-30 MED ORDER — CELECOXIB 200 MG PO CAPS
ORAL_CAPSULE | ORAL | Status: AC
  Administered 2017-09-30: 200 mg via ORAL
  Filled 2017-09-30: qty 1

## 2017-09-30 MED ORDER — GABAPENTIN 300 MG PO CAPS
ORAL_CAPSULE | ORAL | Status: AC
  Administered 2017-09-30: 300 mg via ORAL
  Filled 2017-09-30: qty 1

## 2017-09-30 MED ORDER — BUPIVACAINE HCL (PF) 0.25 % IJ SOLN
INTRAMUSCULAR | Status: DC | PRN
  Administered 2017-09-30: 15 mL

## 2017-09-30 MED ORDER — MIDAZOLAM HCL 2 MG/2ML IJ SOLN
INTRAMUSCULAR | Status: AC
  Filled 2017-09-30: qty 2

## 2017-09-30 MED ORDER — ACETAMINOPHEN 500 MG PO TABS
1000.0000 mg | ORAL_TABLET | ORAL | Status: AC
  Administered 2017-09-30: 1000 mg via ORAL

## 2017-09-30 MED ORDER — MEPERIDINE HCL 50 MG/ML IJ SOLN: 6.2500 mg | INTRAMUSCULAR | Status: DC | PRN

## 2017-09-30 MED ORDER — TRAMADOL HCL 50 MG PO TABS: 50.0000 mg | ORAL_TABLET | Freq: Four times a day (QID) | ORAL | 0 refills | Status: DC | PRN

## 2017-09-30 MED ORDER — MIDAZOLAM HCL 5 MG/5ML IJ SOLN
INTRAMUSCULAR | Status: DC | PRN
  Administered 2017-09-30: 2 mg via INTRAVENOUS

## 2017-09-30 MED ORDER — LIDOCAINE HCL (CARDIAC) PF 100 MG/5ML IV SOSY
PREFILLED_SYRINGE | INTRAVENOUS | Status: DC | PRN
  Administered 2017-09-30: 100 mg via INTRAVENOUS

## 2017-09-30 MED ORDER — CELECOXIB 200 MG PO CAPS
200.0000 mg | ORAL_CAPSULE | ORAL | Status: AC
  Administered 2017-09-30: 200 mg via ORAL

## 2017-09-30 MED ORDER — PROMETHAZINE HCL 25 MG/ML IJ SOLN: 6.2500 mg | INTRAMUSCULAR | Status: DC | PRN

## 2017-09-30 MED ORDER — OXYCODONE HCL 5 MG/5ML PO SOLN: 5.0000 mg | Freq: Once | ORAL | Status: DC | PRN

## 2017-09-30 MED ORDER — SUGAMMADEX SODIUM 200 MG/2ML IV SOLN
INTRAVENOUS | Status: DC | PRN
  Administered 2017-09-30: 249.4 mg via INTRAVENOUS

## 2017-09-30 MED ORDER — GABAPENTIN 300 MG PO CAPS
300.0000 mg | ORAL_CAPSULE | ORAL | Status: AC
  Administered 2017-09-30: 300 mg via ORAL

## 2017-09-30 MED ORDER — LIDOCAINE-EPINEPHRINE 0.5 %-1:200000 IJ SOLN
INTRAMUSCULAR | Status: AC
  Filled 2017-09-30: qty 1

## 2017-09-30 MED ORDER — ROCURONIUM BROMIDE 50 MG/5ML IV SOSY
PREFILLED_SYRINGE | INTRAVENOUS | Status: DC | PRN
  Administered 2017-09-30: 50 mg via INTRAVENOUS

## 2017-09-30 MED ORDER — LIDOCAINE 2% (20 MG/ML) 5 ML SYRINGE
INTRAMUSCULAR | Status: AC
  Filled 2017-09-30: qty 5

## 2017-09-30 MED ORDER — CHLORHEXIDINE GLUCONATE CLOTH 2 % EX PADS: 6.0000 | MEDICATED_PAD | Freq: Once | CUTANEOUS | Status: DC

## 2017-09-30 MED ORDER — HYDROMORPHONE HCL 1 MG/ML IJ SOLN: 0.2500 mg | INTRAMUSCULAR | Status: DC | PRN

## 2017-09-30 MED ORDER — DEXAMETHASONE SODIUM PHOSPHATE 10 MG/ML IJ SOLN
INTRAMUSCULAR | Status: AC
  Filled 2017-09-30: qty 1

## 2017-09-30 MED ORDER — BUPIVACAINE HCL (PF) 0.25 % IJ SOLN
INTRAMUSCULAR | Status: AC
  Filled 2017-09-30: qty 30

## 2017-09-30 MED ORDER — 0.9 % SODIUM CHLORIDE (POUR BTL) OPTIME
TOPICAL | Status: DC | PRN
  Administered 2017-09-30: 1000 mL

## 2017-09-30 MED ORDER — PROPOFOL 10 MG/ML IV BOLUS
INTRAVENOUS | Status: AC
  Filled 2017-09-30: qty 20

## 2017-09-30 MED ORDER — OXYCODONE HCL 5 MG PO TABS: 5.0000 mg | ORAL_TABLET | Freq: Once | ORAL | Status: DC | PRN

## 2017-09-30 MED ORDER — PROPOFOL 10 MG/ML IV BOLUS
INTRAVENOUS | Status: DC | PRN
  Administered 2017-09-30: 200 mg via INTRAVENOUS

## 2017-09-30 MED ORDER — ACETAMINOPHEN 500 MG PO TABS
ORAL_TABLET | ORAL | Status: AC
  Administered 2017-09-30: 1000 mg via ORAL
  Filled 2017-09-30: qty 2

## 2017-09-30 MED ORDER — FENTANYL CITRATE (PF) 100 MCG/2ML IJ SOLN
INTRAMUSCULAR | Status: DC | PRN
  Administered 2017-09-30: 100 ug via INTRAVENOUS
  Administered 2017-09-30 (×2): 50 ug via INTRAVENOUS

## 2017-09-30 MED ORDER — LACTATED RINGERS IV SOLN
INTRAVENOUS | Status: DC
  Administered 2017-09-30 (×2): via INTRAVENOUS

## 2017-09-30 MED ORDER — ALBUTEROL SULFATE HFA 108 (90 BASE) MCG/ACT IN AERS
INHALATION_SPRAY | RESPIRATORY_TRACT | Status: DC | PRN
  Administered 2017-09-30: 2 via RESPIRATORY_TRACT

## 2017-09-30 MED ORDER — PHENYLEPHRINE 40 MCG/ML (10ML) SYRINGE FOR IV PUSH (FOR BLOOD PRESSURE SUPPORT)
PREFILLED_SYRINGE | INTRAVENOUS | Status: AC
  Filled 2017-09-30: qty 10

## 2017-09-30 MED ORDER — FENTANYL CITRATE (PF) 250 MCG/5ML IJ SOLN
INTRAMUSCULAR | Status: AC
  Filled 2017-09-30: qty 5

## 2017-09-30 SURGICAL SUPPLY — 41 items
ADH SKN CLS APL DERMABOND .7 (GAUZE/BANDAGES/DRESSINGS) ×2
BLADE SURG 15 STRL LF DISP TIS (BLADE) IMPLANT
BLADE SURG 15 STRL SS (BLADE) ×3
CHLORAPREP W/TINT 26ML (MISCELLANEOUS) ×3 IMPLANT
COVER SURGICAL LIGHT HANDLE (MISCELLANEOUS) ×3 IMPLANT
DECANTER SPIKE VIAL GLASS SM (MISCELLANEOUS) ×2 IMPLANT
DERMABOND ADVANCED (GAUZE/BANDAGES/DRESSINGS) ×4
DERMABOND ADVANCED .7 DNX12 (GAUZE/BANDAGES/DRESSINGS) ×1 IMPLANT
DRAPE LAPAROTOMY 100X72 PEDS (DRAPES) ×1 IMPLANT
DRAPE ORTHO SPLIT 87X125 STRL (DRAPES) ×4 IMPLANT
DRAPE UTILITY XL STRL (DRAPES) ×6 IMPLANT
ELECT CAUTERY BLADE 6.4 (BLADE) ×1 IMPLANT
ELECT REM PT RETURN 9FT ADLT (ELECTROSURGICAL) ×3
ELECTRODE REM PT RTRN 9FT ADLT (ELECTROSURGICAL) ×1 IMPLANT
GAUZE 4X4 16PLY RFD (DISPOSABLE) ×1 IMPLANT
GLOVE BIO SURGEON STRL SZ7.5 (GLOVE) ×3 IMPLANT
GLOVE BIOGEL PI IND STRL 8 (GLOVE) ×1 IMPLANT
GLOVE BIOGEL PI INDICATOR 8 (GLOVE) ×2
GOWN STRL REUS W/ TWL LRG LVL3 (GOWN DISPOSABLE) ×2 IMPLANT
GOWN STRL REUS W/TWL LRG LVL3 (GOWN DISPOSABLE) ×3
KIT BASIN OR (CUSTOM PROCEDURE TRAY) ×3 IMPLANT
KIT TURNOVER KIT B (KITS) ×3 IMPLANT
MANIFOLD NEPTUNE WASTE (CANNULA) ×2 IMPLANT
NDL HYPO 25GX1X1/2 BEV (NEEDLE) ×1 IMPLANT
NEEDLE HYPO 25GX1X1/2 BEV (NEEDLE) ×3 IMPLANT
NS IRRIG 1000ML POUR BTL (IV SOLUTION) ×3 IMPLANT
PACK SURGICAL SETUP 50X90 (CUSTOM PROCEDURE TRAY) ×3 IMPLANT
PAD ARMBOARD 7.5X6 YLW CONV (MISCELLANEOUS) ×7 IMPLANT
PENCIL BUTTON HOLSTER BLD 10FT (ELECTRODE) ×1 IMPLANT
PENCIL SMOKE EVACUATOR (MISCELLANEOUS) ×2 IMPLANT
SUT MNCRL AB 4-0 PS2 18 (SUTURE) ×5 IMPLANT
SUT VIC AB 3-0 SH 18 (SUTURE) ×2 IMPLANT
SUT VIC AB 3-0 SH 27 (SUTURE) ×6
SUT VIC AB 3-0 SH 27XBRD (SUTURE) ×1 IMPLANT
SYR BULB 3OZ (MISCELLANEOUS) ×3 IMPLANT
SYR CONTROL 10ML LL (SYRINGE) ×3 IMPLANT
TOWEL OR 17X24 6PK STRL BLUE (TOWEL DISPOSABLE) ×3 IMPLANT
TOWEL OR 17X26 10 PK STRL BLUE (TOWEL DISPOSABLE) ×3 IMPLANT
TUBE CONNECTING 12'X1/4 (SUCTIONS) ×1
TUBE CONNECTING 12X1/4 (SUCTIONS) ×1 IMPLANT
YANKAUER SUCT BULB TIP NO VENT (SUCTIONS) ×2 IMPLANT

## 2017-09-30 NOTE — Anesthesia Procedure Notes (Signed)
Procedure Name: Intubation Date/Time: 09/30/2017 10:24 AM Performed by: Lavell Luster, CRNA Pre-anesthesia Checklist: Patient identified, Suction available, Emergency Drugs available, Patient being monitored and Timeout performed Patient Re-evaluated:Patient Re-evaluated prior to induction Oxygen Delivery Method: Circle system utilized Preoxygenation: Pre-oxygenation with 100% oxygen Induction Type: IV induction Ventilation: Oral airway inserted - appropriate to patient size and Mask ventilation with difficulty Laryngoscope Size: Mac and 4 Grade View: Grade II Tube type: Oral Tube size: 7.5 mm Number of attempts: 1 Airway Equipment and Method: Stylet Placement Confirmation: ETT inserted through vocal cords under direct vision,  positive ETCO2 and breath sounds checked- equal and bilateral Secured at: 24 cm Tube secured with: Tape Dental Injury: Teeth and Oropharynx as per pre-operative assessment

## 2017-09-30 NOTE — Anesthesia Preprocedure Evaluation (Addendum)
Anesthesia Evaluation    Airway Mallampati: II  TM Distance: >3 FB Neck ROM: Full    Dental no notable dental hx.    Pulmonary Current Smoker,    Pulmonary exam normal breath sounds clear to auscultation       Cardiovascular hypertension, Normal cardiovascular exam Rhythm:Regular Rate:Normal     Neuro/Psych    GI/Hepatic   Endo/Other    Renal/GU      Musculoskeletal   Abdominal   Peds  Hematology   Anesthesia Other Findings   Reproductive/Obstetrics                            Anesthesia Physical Anesthesia Plan  ASA: II  Anesthesia Plan: General   Post-op Pain Management:    Induction: Intravenous  PONV Risk Score and Plan: 1 and Ondansetron  Airway Management Planned: Oral ETT  Additional Equipment:   Intra-op Plan:   Post-operative Plan: Extubation in OR  Informed Consent: I have reviewed the patients History and Physical, chart, labs and discussed the procedure including the risks, benefits and alternatives for the proposed anesthesia with the patient or authorized representative who has indicated his/her understanding and acceptance.   Dental advisory given  Plan Discussed with: CRNA  Anesthesia Plan Comments:         Anesthesia Quick Evaluation

## 2017-09-30 NOTE — Op Note (Signed)
09/30/2017  11:10 AM  PATIENT:  Bradley Morales  37 y.o. male  PRE-OPERATIVE DIAGNOSIS:  POSTERIOR NECK CYST, LOWER BACK CYST  POST-OPERATIVE DIAGNOSIS:  POSTERIOR NECK CYST, LOWER BACK CYST  PROCEDURE:  Procedure(s): EXCISION OF POSTERIOR NECK CYST [4x3cm] AND LOWER BACK CYST [2.5x2cm] (N/A)  SURGEON:  Surgeon(s) and Role:    Ralene Ok, MD - Primary  ANESTHESIA:   local and general  EBL:  minimal   BLOOD ADMINISTERED:none  DRAINS: none   LOCAL MEDICATIONS USED:  BUPIVICAINE   SPECIMEN:  Source of Specimen:  POSTERIOR NECK CYST [4x3cm] AND LOWER BACK CYST [2.5x2cm]   DISPOSITION OF SPECIMEN:  PATHOLOGY  COUNTS:  YES  TOURNIQUET:  * No tourniquets in log *  DICTATION: .Dragon Dictation  After the patient was consented she was taken to OR and placed in supine position with bilateral SCDs in place. He underwent GETA anesthesia. He was placed in the prone position. Patient was prepped and draped in standard fashion. Timeout was called all facts verified.  Quarter percent Marcaine with epinephrine was then used to create a field block around the cyst.  A 2cm linear incision was made over the area of the lower back cyst cyst. This was dissected down sharply and circumferentially down to healthy fat tissue. This was excised.  It measured 2.5 x 2 cm. This was sent to pathology. Cautery was used to maintain hemostasis. At this time 3-0 Vicryl used to reapproximate the deep dermal layers. 3-0 Monocryl was used to reapproximate the skin in a subcuticular fashion. The skin was dressed with Dermabond.    A 4cm linear incision was made over the area of the posterior neck cyst. This was dissected down sharply and circumferentially down to healthy fat tissue. This was excised.  It measured 4x3 cm. This was sent to pathology. Cautery was used to maintain hemostasis. At this time 3-0 Vicryl used to reapproximate the deep dermal layers. 3-0 Monocryl was used to reapproximate the skin in a  subcuticular fashion. The skin was dressed with Dermabond.  The patient tied the procedure well was taken to recovery room stable condition.   PLAN OF CARE: Discharge to home after PACU  PATIENT DISPOSITION:  PACU - hemodynamically stable.   Delay start of Pharmacological VTE agent (>24hrs) due to surgical blood loss or risk of bleeding: not applicable

## 2017-09-30 NOTE — Discharge Instructions (Signed)
Epidermal Cyst Removal Epidermal cyst removal is a procedure to remove a sac of oily material that forms under your skin (epidermal cyst). Epidermal cysts may also be called epidermoid cysts or keratin cysts. Normally, the skin secretes this oily material through a gland or a hair follicle. This type of cyst usually results when a skin gland or hair follicle becomes blocked. You may need this procedure if you have an epidermal cyst that becomes large, uncomfortable, or infected. Tell a health care provider about:  Any allergies you have.  All medicines you are taking, including vitamins, herbs, eye drops, creams, and over-the-counter medicines.  Any problems you or family members have had with anesthetic medicines.  Any blood disorders you have.  Any surgeries you have had.  Any medical conditions you have. What are the risks? Generally, this is a safe procedure. However, problems may occur, including:  Developing another cyst.  Bleeding.  Infection.  Scarring.  What happens before the procedure?  Ask your health care provider about: ? Changing or stopping your regular medicines. This is especially important if you are taking diabetes medicines or blood thinners. ? Taking medicines such as aspirin and ibuprofen. These medicines can thin your blood. Do not take these medicines before your procedure if your health care provider instructs you not to.  If you have an infected cyst, you may have to take antibiotic medicines before or after the cyst removal. Take your antibiotics as directed by your health care provider. Finish all of the medicine even if you start to feel better.  Take a shower on the morning of your procedure. Your health care provider may ask you to use a germ-killing (antiseptic) soap. What happens during the procedure?  You will be given a medicine that numbs the area (local anesthetic).  The skin around the cyst will be cleaned with a germ-killing solution  (antiseptic).  Your health care provider will make a small surgical incision over the cyst.  The cyst will be separated from the surrounding tissues that are under your skin.  If possible, the cyst will be removed undamaged (intact).  If the cyst bursts (ruptures), it will need to be removed in pieces.  After the cyst is removed, your health care provider will control any bleeding and close the incision with small stitches (sutures). Small incisions may not need sutures, and the bleeding will be controlled by applying direct pressure with gauze.  Your health care provider may apply antibiotic ointment and a light bandage (dressing) over the incision. This procedure may vary among health care providers and hospitals. What happens after the procedure?  If your cyst ruptured during surgery, you may need to take antibiotic medicine. If you were prescribed an antibiotic medicine, finish all of it even if you start to feel better. This information is not intended to replace advice given to you by your health care provider. Make sure you discuss any questions you have with your health care provider. Document Released: 01/02/2000 Document Revised: 06/12/2015 Document Reviewed: 09/19/2013 Elsevier Interactive Patient Education  2018 [employer].

## 2017-09-30 NOTE — H&P (Signed)
History of Present Illness Bradley Morales; 08/08/2017 3:12 PM) The patient is a 37 year old male who presents with a complaint of cyst. Patient is a 37 year old male who comes in for chief complaint of a posterior neck cyst in posterior back cyst. Patient is referred by Dr. Cindee Lame.  Patient states that the cyst of his upper neck and the for multiple years. He states he does not have any expression of fluid or drainage. Patient does state that his gotten bigger from his initial onset. He states that there pain at times. Patient states that the area in the lower back does cause some discomfort. There is been no drainage of any fluids from the lower back cyst.   Past Surgical History Sabino Gasser; 08/08/2017 2:55 PM) Oral Surgery  Vasectomy   Diagnostic Studies History Sabino Gasser; 08/08/2017 2:55 PM) Colonoscopy  never  Allergies Sabino Gasser; 08/08/2017 2:56 PM) No Known Drug Allergies [08/08/2017]: Allergies Reconciled   Medication History Sabino Gasser; 08/08/2017 2:56 PM) No Current Medications Medications Reconciled  Social History Sabino Gasser; 08/08/2017 2:55 PM) No alcohol use  No caffeine use  No drug use  Tobacco use  Current every day smoker.  Family History Sabino Gasser; 08/08/2017 2:55 PM) Arthritis  Father, Mother. Diabetes Mellitus  Father. Hypertension  Father, Mother.  Other Problems Sabino Gasser; 08/08/2017 2:55 PM) Back Pain  High blood pressure  Sleep Apnea     Review of Systems Bradley Morales; 08/08/2017 3:11 PM) General Not Present- Appetite Loss, Chills, Fatigue, Fever, Night Sweats, Weight Gain and Weight Loss. Skin Not Present- Change in Wart/Mole, Dryness, Hives, Jaundice, New Lesions, Non-Healing Wounds, Rash and Ulcer. HEENT Not Present- Earache, Hearing Loss, Hoarseness, Nose Bleed, Oral Ulcers, Ringing in the Ears, Seasonal Allergies, Sinus Pain, Sore Throat, Visual Disturbances, Wears glasses/contact  lenses and Yellow Eyes. Respiratory Not Present- Bloody sputum, Chronic Cough, Difficulty Breathing, Snoring and Wheezing. Breast Not Present- Breast Mass, Breast Pain, Nipple Discharge and Skin Changes. Cardiovascular Not Present- Chest Pain, Difficulty Breathing Lying Down, Leg Cramps, Palpitations, Rapid Heart Rate, Shortness of Breath and Swelling of Extremities. Gastrointestinal Not Present- Abdominal Pain, Bloating, Bloody Stool, Change in Bowel Habits, Chronic diarrhea, Constipation, Difficulty Swallowing, Excessive gas, Gets full quickly at meals, Hemorrhoids, Indigestion, Nausea, Rectal Pain and Vomiting. Male Genitourinary Not Present- Blood in Urine, Change in Urinary Stream, Frequency, Impotence, Nocturia, Painful Urination, Urgency and Urine Leakage. All other systems negative  Vitals Sabino Gasser; 08/08/2017 2:57 PM) 08/08/2017 2:56 PM Weight: 273.25 lb Height: 70in Body Surface Area: 2.38 m Body Mass Index: 39.21 kg/m  Temp.: 98.80F(Oral)  Pulse: 104 (Regular)  BP: 134/80 (Sitting, Left Arm, Standard)       Physical Exam Bradley Morales; 08/08/2017 3:12 PM) The physical exam findings are as follows: Note:Constitutional: No acute distress, conversant, appears stated age  Eyes: Anicteric sclerae, moist conjunctiva, no lid lag  Neck: No thyromegaly, trachea midline, no cervical lymphadenopathy posterior neck cyst approximately 2 x 2 centimeters, subcutaneous  Lungs: Clear to auscultation biilaterally, normal respiratory effot  Cardiovascular: regular rate & rhythm, no murmurs, no peripheal edema, pedal pulses 2+  GI: Soft, no masses or hepatosplenomegaly, non-tender to palpation  MSK: Normal gait, no clubbing cyanosis, edema  Skin: No rashes, palpation reveals normal skin turgor, lower right paramedian cyst, approximately 2 x 2 centimeters, subcutaneous  Psychiatric: Appropriate judgment and insight, oriented to person, place, and  time    Assessment & Plan Bradley Morales; 08/08/2017 3:13 PM) CYST OF SKIN (  L72.9) Impression: 37 year old male with likely epidermal inclusion cysts of the posterior neck and lower right paramedian back.  1. Patient will like to proceed to the operating room for excision of cyst 2. 2. I discussed with the patient the risks and benefits of the procedure to include but not limited to: Infection, bleeding, damage to structures, possible recurrence, and possible skin dehiscence. The patient was understanding and wishes to proceed.

## 2017-09-30 NOTE — Transfer of Care (Signed)
Immediate Anesthesia Transfer of Care Note  Patient: Bradley Morales  Procedure(s) Performed: EXCISION OF POSTERIOR NECK CYST AND LOWER BACK CYST (N/A Back)  Patient Location: PACU  Anesthesia Type:General  Level of Consciousness: awake, alert  and oriented  Airway & Oxygen Therapy: Patient Spontanous Breathing  Post-op Assessment: Post -op Vital signs reviewed and stable  Post vital signs: stable  Last Vitals:  Vitals Value Taken Time  BP 134/81 09/30/2017 11:21 AM  Temp    Pulse 94 09/30/2017 11:21 AM  Resp 27 09/30/2017 11:21 AM  SpO2 95 % 09/30/2017 11:21 AM  Vitals shown include unvalidated device data.  Last Pain: There were no vitals filed for this visit.       Complications: No apparent anesthesia complications

## 2017-09-30 NOTE — Anesthesia Postprocedure Evaluation (Signed)
Anesthesia Post Note  Patient: Bradley Morales  Procedure(s) Performed: EXCISION OF POSTERIOR NECK CYST AND LOWER BACK CYST (N/A Back)     Patient location during evaluation: PACU Anesthesia Type: General Level of consciousness: awake and alert Pain management: pain level controlled Vital Signs Assessment: post-procedure vital signs reviewed and stable Respiratory status: spontaneous breathing, nonlabored ventilation and respiratory function stable Cardiovascular status: blood pressure returned to baseline and stable Postop Assessment: no apparent nausea or vomiting Anesthetic complications: no    Last Vitals:  Vitals:   09/30/17 1159 09/30/17 1207  BP: 120/78 118/90  Pulse: (!) 50 (!) 49  Resp:    Temp:    SpO2: 98% 100%    Last Pain:  Vitals:   09/30/17 1145  PainSc: 0-No pain                 Lynda Rainwater

## 2017-10-01 ENCOUNTER — Encounter (HOSPITAL_COMMUNITY): Payer: Self-pay | Admitting: General Surgery

## 2017-11-09 ENCOUNTER — Encounter: Payer: Self-pay | Admitting: Family Medicine

## 2017-12-27 ENCOUNTER — Ambulatory Visit (INDEPENDENT_AMBULATORY_CARE_PROVIDER_SITE_OTHER): Payer: 59 | Admitting: Family Medicine

## 2017-12-27 ENCOUNTER — Encounter: Payer: Self-pay | Admitting: Family Medicine

## 2017-12-27 VITALS — BP 135/87 | HR 98 | Temp 98.5°F | Ht 70.0 in | Wt 267.2 lb

## 2017-12-27 DIAGNOSIS — Z6838 Body mass index (BMI) 38.0-38.9, adult: Secondary | ICD-10-CM | POA: Diagnosis not present

## 2017-12-27 DIAGNOSIS — E785 Hyperlipidemia, unspecified: Secondary | ICD-10-CM | POA: Diagnosis not present

## 2017-12-27 DIAGNOSIS — L853 Xerosis cutis: Secondary | ICD-10-CM

## 2017-12-27 DIAGNOSIS — Z0000 Encounter for general adult medical examination without abnormal findings: Secondary | ICD-10-CM | POA: Diagnosis not present

## 2017-12-27 DIAGNOSIS — I10 Essential (primary) hypertension: Secondary | ICD-10-CM

## 2017-12-27 LAB — LIPID PANEL
CHOL/HDL RATIO: 4.7 ratio (ref 0.0–5.0)
Cholesterol, Total: 173 mg/dL (ref 100–199)
HDL: 37 mg/dL — ABNORMAL LOW (ref >39)
LDL Calculated: 122 mg/dL — ABNORMAL HIGH (ref 0–99)
Triglycerides: 69 mg/dL (ref 0–149)
VLDL Cholesterol Cal: 14 mg/dL (ref 5–40)

## 2017-12-27 LAB — COMPREHENSIVE METABOLIC PANEL
ALT: 24 IU/L (ref 0–44)
AST: 17 IU/L (ref 0–40)
Albumin/Globulin Ratio: 1.8 (ref 1.2–2.2)
Albumin: 4.5 g/dL (ref 3.5–5.5)
Alkaline Phosphatase: 81 IU/L (ref 39–117)
BILIRUBIN TOTAL: 0.6 mg/dL (ref 0.0–1.2)
BUN/Creatinine Ratio: 10 (ref 9–20)
BUN: 11 mg/dL (ref 6–20)
CALCIUM: 9.7 mg/dL (ref 8.7–10.2)
CO2: 21 mmol/L (ref 20–29)
CREATININE: 1.05 mg/dL (ref 0.76–1.27)
Chloride: 103 mmol/L (ref 96–106)
GFR, EST AFRICAN AMERICAN: 106 mL/min/{1.73_m2} (ref >59)
GFR, EST NON AFRICAN AMERICAN: 92 mL/min/{1.73_m2} (ref >59)
GLUCOSE: 83 mg/dL (ref 65–99)
Globulin, Total: 2.5 g/dL (ref 1.5–4.5)
Potassium: 4.5 mmol/L (ref 3.5–5.2)
Sodium: 140 mmol/L (ref 134–144)
TOTAL PROTEIN: 7 g/dL (ref 6.0–8.5)

## 2017-12-27 MED ORDER — LISINOPRIL 5 MG PO TABS: 5.0000 mg | ORAL_TABLET | Freq: Every day | ORAL | 2 refills | Status: DC

## 2017-12-27 NOTE — Patient Instructions (Addendum)
Drink at least 64 ounces of water daily. Consider a humidifier for the room where you sleep. Bathe once daily. Avoid using HOT water, as it dries skin.  Avoid deodorant soaps (Dial is the worst!) and stick with gentle cleansers (I like Cetaphil Liquid Cleanser). After bathing, dry off completely, then apply a thick emollient cream (I like Cetaphil Moisturizing Cream, eucerin or aveeno) Apply the cream twice daily, or more!  No med changes at this time. Watch diet for continued weight loss.   Recheck 6 months for blood pressure.   Thanks for coming in today.   Keeping you healthy  Get these tests  Blood pressure- Have your blood pressure checked once a year by your healthcare provider.  Normal blood pressure is 120/80.  Weight- Have your body mass index (BMI) calculated to screen for obesity.  BMI is a measure of body fat based on height and weight. You can also calculate your own BMI at GravelBags.it.  Cholesterol- Have your cholesterol checked regularly starting at age 65, sooner may be necessary if you have diabetes, high blood pressure, if a family member developed heart diseases at an early age or if you smoke.   Chlamydia, HIV, and other sexual transmitted disease- Get screened each year until the age of 30 then within three months of each new sexual partner.  Diabetes- Have your blood sugar checked regularly if you have high blood pressure, high cholesterol, a family history of diabetes or if you are overweight.  Get these vaccines  Flu shot- Every fall.  Tetanus shot- Every 10 years.  Menactra- Single dose; prevents meningitis.  Take these steps  Don't smoke- If you do smoke, ask your healthcare provider about quitting. For tips on how to quit, go to www.smokefree.gov or call 1-800-QUIT-NOW.  Be physically active- Exercise 5 days a week for at least 30 minutes.  If you are not already physically active start slow and gradually work up to 30 minutes of  moderate physical activity.  Examples of moderate activity include walking briskly, mowing the yard, dancing, swimming bicycling, etc.  Eat a healthy diet- Eat a variety of healthy foods such as fruits, vegetables, low fat milk, low fat cheese, yogurt, lean meats, poultry, fish, beans, tofu, etc.  For more information on healthy eating, go to www.thenutritionsource.org  Drink alcohol in moderation- Limit alcohol intake two drinks or less a day.  Never drink and drive.  Dentist- Brush and floss teeth twice daily; visit your dentis twice a year.  Depression-Your emotional health is as important as your physical health.  If you're feeling down, losing interest in things you normally enjoy please talk with your healthcare provider.  Gun Safety- If you keep a gun in your home, keep it unloaded and with the safety lock on.  Bullets should be stored separately.  Helmet use- Always wear a helmet when riding a motorcycle, bicycle, rollerblading or skateboarding.  Safe sex- If you may be exposed to a sexually transmitted infection, use a condom  Seat belts- Seat bels can save your life; always wear one.  Smoke/Carbon Monoxide detectors- These detectors need to be installed on the appropriate level of your home.  Replace batteries at least once a year.  Skin Cancer- When out in the sun, cover up and use sunscreen SPF 15 or higher.  Violence- If anyone is threatening or hurting you, please tell your healthcare provider.   If you have lab work done today you will be contacted with your lab results  within the next 2 weeks.  If you have not heard from [place] then please contact us. The fastest way to get your results is to register for My Chart.   IF you received an x-ray today, you will receive an invoice from [facility]. Please contact [facility] at [phone] with questions or concerns regarding your invoice.   IF you received labwork today, you will receive an invoice from  [place]. Please contact [facility] at [phone] with questions or concerns regarding your invoice.   Our billing staff will not be able to assist you with questions regarding bills from these companies.  You will be contacted with the lab results as soon as they are available. The fastest way to get your results is to activate your My Chart account. Instructions are located on the last page of this paperwork. If you have not heard from [place] regarding the results in 2 weeks, please contact this office.

## 2017-12-27 NOTE — Progress Notes (Signed)
Subjective:    Patient ID: Bradley Morales, male    DOB: August 03, 1980, 37 y.o.   MRN: [id number]  HPI Bradley Morales is a 37 y.o. male Presents today for: Chief Complaint  Patient presents with  . Annual Exam    CPE   Hypertension: BP Readings from Last 3 Encounters:  12/27/17 135/87  09/30/17 118/90  09/23/17 (!) 171/96   Lab Results  Component Value Date   CREATININE 1.19 09/23/2017  Takes lisinopril 5 mg daily. No new side effects.   Obstructive sleep apnea:  Note reviewed from Dr. Brett Fairy in 2015.  Initial study in 2015 with AHI 9.2.  REM sleep 27/h, non-REM sleep 3.8,O2 sat nadir 90%.  CPAP used , but he did not notice much improvement with use of CPAP.  Weight management was discussed, not on CPAP. Sleeping ok.  Feels rested during the day.   Obesity: Weight has decreased from September visit. Wt Readings from Last 3 Encounters:  12/27/17 267 lb 3.2 oz (121.2 kg)  09/30/17 275 lb (124.7 kg)  09/23/17 275 lb 3.2 oz (124.8 kg)   Body mass index is 38.34 kg/m.  Working around the house. No diet changes.   Immunization History  Administered Date(s) Administered  . Influenza Split 11/03/2011, 09/05/2012  . Influenza-Unspecified 11/07/2013, 11/14/2014  . Tdap 01/18/2010  had October at work - 11/09/17   Depression screen PHQ 2/9 12/27/2017 06/21/2017 12/21/2016 06/10/2016 01/22/2015  Decreased Interest 0 0 0 0 0  Down, Depressed, Hopeless 0 0 0 0 0  PHQ - 2 Score 0 0 0 0 0   Dentist: last visit 4 months ago.   Exercise: working around house and farm. Walking throughout the day, works in [employer]   STI testing: declines today, no new partners since last tested.   Dry skin in winter - hands and lower leg.  Occasional lotion after showering.   Lipid screening: Lab Results  Component Value Date   CHOL 145 06/25/2017   HDL 31 (L) 06/25/2017   LDLCALC 90 06/25/2017   TRIG 120 06/25/2017   CHOLHDL 4.7 06/25/2017    Patient Active Problem List   Diagnosis Date  Noted  . Obesity due to excess calories   . Snoring disorder   . HTN (hypertension) 08/30/2012  . Palpitations 04/06/2012  . OSA (obstructive sleep apnea) 03/06/2012  . Nicotine addiction 11/05/2011  . BMI 45.0-49.9, adult ([place]) 11/05/2011   Past Medical History:  Diagnosis Date  . Bell's palsy   . Hypertension   . Obesity due to excess calories   . Snoring disorder    Past Surgical History:  Procedure Laterality Date  . CYST EXCISION N/A 09/30/2017   Procedure: EXCISION OF POSTERIOR NECK CYST AND LOWER BACK CYST;  Surgeon: Ralene Ok, MD;  Location: [facility];  Service: General;  Laterality: N/A;  . VASECTOMY    . WISDOM TOOTH EXTRACTION     No Known Allergies Prior to Admission medications   Medication Sig Start Date End Date Taking? Authorizing Provider  lisinopril (PRINIVIL,ZESTRIL) 5 MG tablet Take 1 tablet (5 mg total) by mouth daily. 06/21/17  Yes [name], MD   Social History   Socioeconomic History  . Marital status: Married    Spouse name: Melissa  . Number of children: 1  . Years of education: 12  . Highest education level: Not on file  Occupational History  . 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 Occupation: Dealer  Employer: [employer]  Social Needs  . Financial resource strain: Not on file  . Food insecurity:    Worry: Not on file    Inability: Not on file  . Transportation needs:    Medical: Not on file    Non-medical: Not on file  Tobacco Use  . Smoking status: Current Every Day Smoker    Packs/day: 1.00    Years: 10.00    Pack years: 10.00    Types: Cigarettes  . Smokeless tobacco: Never Used  Substance and Sexual Activity  . Alcohol use: Yes    Comment: rarely  . Drug use: No  . Sexual activity: Yes    Birth control/protection: None    Comment: 1 partner in last 12 months  Lifestyle  . Physical activity:    Days per week: Not on file    Minutes per session: Not on file  . Stress: Not on file   Relationships  . Social connections:    Talks on phone: Not on file    Gets together: Not on file    Attends religious service: Not on file    Active member of club or organization: Not on file    Attends meetings of clubs or organizations: Not on file    Relationship status: Not on file  . Intimate partner violence:    Fear of current or ex partner: Not on file    Emotionally abused: Not on file    Physically abused: Not on file    Forced sexual activity: Not on file  Other Topics Concern  . Not on file  Social History Narrative   Patient is married Programme researcher, broadcasting/film/video) and lives at home with his family.   Patient has one child.   Patient is working full-time.   Patient has a high school education.   Patient is right-handed.   Patient drinks 3 cups of soda per day.    Review of Systems 13 point review of systems per patient health survey noted.  Negative other than as indicated above or in HPI.      Objective:   Physical Exam  Constitutional: He is oriented to person, place, and time. He appears well-developed and well-nourished.  HENT:  Head: Normocephalic and atraumatic.  Right Ear: External ear normal.  Left Ear: External ear normal.  Mouth/Throat: Oropharynx is clear and moist.  Eyes: Pupils are equal, round, and reactive to light. Conjunctivae and EOM are normal.  Neck: Normal range of motion. Neck supple. No thyromegaly present.  Cardiovascular: Normal rate, regular rhythm, normal heart sounds and intact distal pulses.  Pulmonary/Chest: Effort normal and breath sounds normal. No respiratory distress. He has no wheezes.  Abdominal: Soft. He exhibits no distension. There is no tenderness.  Musculoskeletal: Normal range of motion. He exhibits no edema or tenderness.  Lymphadenopathy:    He has no cervical adenopathy.  Neurological: He is alert and oriented to person, place, and time. He has normal reflexes.  Skin: Skin is warm and dry.     Psychiatric: He has a normal mood  and affect. His behavior is normal.  Vitals reviewed.  Vitals:   12/27/17 0802 12/27/17 0807  BP: (!) 141/90 135/87  Pulse: 98 98  Temp: 98.5 F (36.9 C)   TempSrc: Oral   SpO2: 99%   Weight: 267 lb 3.2 oz (121.2 kg)   Height: 5\' 10"  (1.778 m)          Assessment & Plan:   Bradley Morales is  a 37 y.o. male Annual physical exam  - -anticipatory guidance as below in AVS, screening labs above. Health maintenance items as above in HPI discussed/recommended as applicable.   Hyperlipidemia, unspecified hyperlipidemia type - Plan: Lipid panel, Comprehensive metabolic panel  -Slight low HDL previously.  Continue exercise, watch diet, recheck levels  Essential hypertension - Plan: lisinopril (PRINIVIL,ZESTRIL) 5 MG tablet  -Borderline, but overall stable.  Continue same dose lisinopril.  Monitor for elevated readings outside of office.  BMI 38.0-38.9,adult  -Diet and exercise discussed, initial goal of BMI less than 35.  Dry skin dermatitis  -Handout given, discussed Eucerin or Aveeno, other dry skin care and RTC precautions given  Meds ordered this encounter  Medications  . lisinopril (PRINIVIL,ZESTRIL) 5 MG tablet    Sig: Take 1 tablet (5 mg total) by mouth daily.    Dispense:  90 tablet    Refill:  2   Patient Instructions     Drink at least 64 ounces of water daily. Consider a humidifier for the room where you sleep. Bathe once daily. Avoid using HOT water, as it dries skin.  Avoid deodorant soaps (Dial is the worst!) and stick with gentle cleansers (I like Cetaphil Liquid Cleanser). After bathing, dry off completely, then apply a thick emollient cream (I like Cetaphil Moisturizing Cream, eucerin or aveeno) Apply the cream twice daily, or more!  No med changes at this time. Watch diet for continued weight loss.   Recheck 6 months for blood pressure.   Thanks for coming in today.   Keeping you healthy  Get these tests  Blood pressure- Have your blood pressure  checked once a year by your healthcare provider.  Normal blood pressure is 120/80.  Weight- Have your body mass index (BMI) calculated to screen for obesity.  BMI is a measure of body fat based on height and weight. You can also calculate your own BMI at [employer]velBags.it.  Cholesterol- Have your cholesterol checked regularly starting at age 28, sooner may be necessary if you have diabetes, high blood pressure, if a family member developed heart diseases at an early age or if you smoke.   Chlamydia, HIV, and other sexual transmitted disease- Get screened each year until the age of 41 then within three months of each new sexual partner.  Diabetes- Have your blood sugar checked regularly if you have high blood pressure, high cholesterol, a family history of diabetes or if you are overweight.  Get these vaccines  Flu shot- Every fall.  Tetanus shot- Every 10 years.  Menactra- Single dose; prevents meningitis.  Take these steps  Don't smoke- If you do smoke, ask your healthcare provider about quitting. For tips on how to quit, go to www.smokefree.gov or call 1-800-QUIT-NOW.  Be physically active- Exercise 5 days a week for at least 30 minutes.  If you are not already physically active start slow and gradually work up to 30 minutes of moderate physical activity.  Examples of moderate activity include walking briskly, mowing the yard, dancing, swimming bicycling, etc.  Eat a healthy diet- Eat a variety of healthy foods such as fruits, vegetables, low fat milk, low fat cheese, yogurt, lean meats, poultry, fish, beans, tofu, etc.  For more information on healthy eating, go to www.thenutritionsource.org  Drink alcohol in moderation- Limit alcohol intake two drinks or less a day.  Never drink and drive.  Dentist- Brush and floss teeth twice daily; visit your dentis twice a year.  Depression-Your emotional health is as important as your  physical health.  If you're feeling down, losing  interest in things you normally enjoy please talk with your healthcare provider.  Gun Safety- If you keep a gun in your home, keep it unloaded and with the safety lock on.  Bullets should be stored separately.  Helmet use- Always wear a helmet when riding a motorcycle, bicycle, rollerblading or skateboarding.  Safe sex- If you may be exposed to a sexually transmitted infection, use a condom  Seat belts- Seat bels can save your life; always wear one.  Smoke/Carbon Monoxide detectors- These detectors need to be installed on the appropriate level of your home.  Replace batteries at least once a year.  Skin Cancer- When out in the sun, cover up and use sunscreen SPF 15 or higher.  Violence- If anyone is threatening or hurting you, please tell your healthcare provider.   If you have lab work done today you will be contacted with your lab results within the next 2 weeks.  If you have not heard from [place] then please contact us. The fastest way to get your results is to register for My Chart.   IF you received an x-ray today, you will receive an invoice from [facility]. Please contact [facility] at [phone] with questions or concerns regarding your invoice.   IF you received labwork today, you will receive an invoice from [place]. Please contact [facility] at [phone] with questions or concerns regarding your invoice.   Our billing staff will not be able to assist you with questions regarding bills from these companies.  You will be contacted with the lab results as soon as they are available. The fastest way to get your results is to activate your My Chart account. Instructions are located on the last page of this paperwork. If you have not heard from [place] regarding the results in 2 weeks, please contact this office.       Signed,   Merri Ray, MD [facility].  12/27/17 8:40 AM

## 2018-06-19 ENCOUNTER — Telehealth (INDEPENDENT_AMBULATORY_CARE_PROVIDER_SITE_OTHER): Payer: 59 | Admitting: Family Medicine

## 2018-06-19 DIAGNOSIS — E785 Hyperlipidemia, unspecified: Secondary | ICD-10-CM | POA: Diagnosis not present

## 2018-06-19 DIAGNOSIS — E669 Obesity, unspecified: Secondary | ICD-10-CM

## 2018-06-19 DIAGNOSIS — I10 Essential (primary) hypertension: Secondary | ICD-10-CM

## 2018-06-19 MED ORDER — LISINOPRIL 5 MG PO TABS: 5.0000 mg | ORAL_TABLET | Freq: Every day | ORAL | 2 refills | Status: DC

## 2018-06-19 NOTE — Progress Notes (Signed)
Virtual Visit via Telephone Note  I connected with Bradley C Mccolgan on 06/19/18 at 9:01 AM by telephone and verified that I am speaking with the correct person using two identifiers.   I discussed the limitations, risks, security and privacy concerns of performing an evaluation and management service by telephone and the availability of in person appointments. I also discussed with the patient that there may be a patient responsible charge related to this service. The patient expressed understanding and agreed to proceed, consent obtained  Chief complaint: Hypertension  History of Present Illness: Bradley Morales is a 38 y.o. male  Hypertension: BP Readings from Last 3 Encounters:  12/27/17 135/87  09/30/17 118/90  09/23/17 (!) 171/96   Lab Results  Component Value Date   CREATININE 1.05 12/27/2017  Takes lisinopril 5 mg daily. No new side effects.  Can't find home machine.  Constitutional: Negative for fatigue and unexpected weight change.  Eyes: Negative for visual disturbance.  Respiratory: Negative for cough, chest tightness and shortness of breath.   Cardiovascular: Negative for chest pain, palpitations and leg swelling.  Gastrointestinal: Negative for abdominal pain and blood in stool.  Neurological: Negative for dizziness, light-headedness and headaches.    Obesity BMI of 38 when seen in December. Wt Readings from Last 3 Encounters:  12/27/17 267 lb 3.2 oz (121.2 kg)  09/30/17 275 lb (124.7 kg)  09/23/17 275 lb 3.2 oz (124.8 kg)   home weight - does not measure. No change in diet/exercise since last time. Drinking water mostly.  Exercise with working on farm.   Obstructive sleep apnea: Followed by sleep specialist, Dr. Brett Fairy.  AHI 9.2 in 2015.  Did not notice much improvement with use of CPAP.  Was feeling rested during the day and sleeping okay when discussed in December last year.  No snoring, no daytime somnolence.   Hyperlipidemia:  Lab Results  Component  Value Date   CHOL 173 12/27/2017   HDL 37 (L) 12/27/2017   LDLCALC 122 (H) 12/27/2017   TRIG 69 12/27/2017   CHOLHDL 4.7 12/27/2017   Lab Results  Component Value Date   ALT 24 12/27/2017   AST 17 12/27/2017   ALKPHOS 81 12/27/2017   BILITOT 0.6 12/27/2017  Unable to assess ASCVD risk based on age, but did discuss diet changes/weight changes for improved LDL as well as increasing HDL.       Patient Active Problem List   Diagnosis Date Noted  . Obesity due to excess calories   . Snoring disorder   . HTN (hypertension) 08/30/2012  . Palpitations 04/06/2012  . OSA (obstructive sleep apnea) 03/06/2012  . Nicotine addiction 11/05/2011  . BMI 45.0-49.9, adult ([place]) 11/05/2011   Past Medical History:  Diagnosis Date  . Bell's palsy   . Hypertension   . Obesity due to excess calories   . Snoring disorder    Past Surgical History:  Procedure Laterality Date  . CYST EXCISION N/A 09/30/2017   Procedure: EXCISION OF POSTERIOR NECK CYST AND LOWER BACK CYST;  Surgeon: Ralene Ok, MD;  Location: [facility];  Service: General;  Laterality: N/A;  . VASECTOMY    . WISDOM TOOTH EXTRACTION     No Known Allergies Prior to Admission medications   Medication Sig Start Date End Date Taking? Authorizing Provider  lisinopril (PRINIVIL,ZESTRIL) 5 MG tablet Take 1 tablet (5 mg total) by mouth daily. 12/27/17   [name], MD   Social History   Socioeconomic History  . Marital status:  Married    Spouse name: Melissa  . Number of children: 1  . Years of education: 14  . Highest education level: Not on file  Occupational History  . 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 Occupation: Music therapist: [employer]  Social Needs  . Financial resource strain: Not on file  . Food insecurity:    Worry: Not on file    Inability: Not on file  . Transportation needs:    Medical: Not on file    Non-medical: Not on file  Tobacco Use  . Smoking status:  Current Every Day Smoker    Packs/day: 1.00    Years: 10.00    Pack years: 10.00    Types: Cigarettes  . Smokeless tobacco: Never Used  Substance and Sexual Activity  . Alcohol use: Yes    Comment: rarely  . Drug use: No  . Sexual activity: Yes    Birth control/protection: None    Comment: 1 partner in last 12 months  Lifestyle  . Physical activity:    Days per week: Not on file    Minutes per session: Not on file  . Stress: Not on file  Relationships  . Social connections:    Talks on phone: Not on file    Gets together: Not on file    Attends religious service: Not on file    Active member of club or organization: Not on file    Attends meetings of clubs or organizations: Not on file    Relationship status: Not on file  . Intimate partner violence:    Fear of current or ex partner: Not on file    Emotionally abused: Not on file    Physically abused: Not on file    Forced sexual activity: Not on file  Other Topics Concern  . Not on file  Social History Narrative   Patient is married Programme researcher, broadcasting/film/video) and lives at home with his family.   Patient has one child.   Patient is working full-time.   Patient has a high school education.   Patient is right-handed.   Patient drinks 3 cups of soda per day.     Observations/Objective: No distress, appropriate responses, understanding expressed of plan with all questions answered   Assessment and Plan: Hyperlipidemia, unspecified hyperlipidemia type - Plan: Lipid Panel  -Low HDL, elevated LDL.  Plan on repeat lipid panel and fasting lab visit.  Diet and exercise discussed.  Essential hypertension - Plan: lisinopril (ZESTRIL) 5 MG tablet, Comprehensive metabolic panel  -Tolerating current regimen, but recommended home monitoring of blood pressures with RTC precautions if elevated.  Continue same with recheck in 6 months  Obesity (BMI 30-39.9)  -As above discussed watching diet and portions.  Does get significant exercise working on  farm.   Follow Up Instructions:  6 months.    I discussed the assessment and treatment plan with the patient. The patient was provided an opportunity to ask questions and all were answered. The patient agreed with the plan and demonstrated an understanding of the instructions.   The patient was advised to call back or seek an in-person evaluation if the symptoms worsen or if the condition fails to improve as anticipated.  I provided 7 minutes of non-face-to-face time during this encounter.  Signed,   Merri Ray, MD [facility].  06/19/18

## 2018-06-19 NOTE — Patient Instructions (Addendum)
Keep a record of your blood pressures outside of the office and if those are over 140/90, return sooner than planned in 6 months to review medication changes.  Continue exercise throughout the week, with at least 150 minutes of exercise per week.  Watch portion control with diet and avoid fried foods/fast food is much as possible as well as sweetened beverages such as sodas or sweet tea.  Follow-up in 6 months.  Please let me know if there are questions prior to that time.   How to Take Your Blood Pressure You can take your blood pressure at home with a machine. You may need to check your blood pressure at home:  To check if you have high blood pressure (hypertension).  To check your blood pressure over time.  To make sure your blood pressure medicine is working. Supplies needed: You will need a blood pressure machine, or monitor. You can buy one at a drugstore or online. When choosing one:  Choose one with an arm cuff.  Choose one that wraps around your upper arm. Only one finger should fit between your arm and the cuff.  Do not choose one that measures your blood pressure from your wrist or finger. Your doctor can suggest a monitor. How to prepare Avoid these things for 30 minutes before checking your blood pressure:  Drinking caffeine.  Drinking alcohol.  Eating.  Smoking.  Exercising. Five minutes before checking your blood pressure:  Pee.  Sit in a dining chair. Avoid sitting in a soft couch or armchair.  Be quiet. Do not talk. How to take your blood pressure Follow the instructions that came with your machine. If you have a digital blood pressure monitor, these may be the instructions: 1. Sit up straight. 2. Place your feet on the floor. Do not cross your ankles or legs. 3. Rest your left arm at the level of your heart. You may rest it on a table, desk, or chair. 4. Pull up your shirt sleeve. 5. Wrap the blood pressure cuff around the upper part of your left  arm. The cuff should be 1 inch (2.5 cm) above your elbow. It is best to wrap the cuff around bare skin. 6. Fit the cuff snugly around your arm. You should be able to place only one finger between the cuff and your arm. 7. Put the cord inside the groove of your elbow. 8. Press the power button. 9. Sit quietly while the cuff fills with air and loses air. 10. Write down the numbers on the screen. 11. Wait 2-3 minutes and then repeat steps 1-10. What do the numbers mean? Two numbers make up your blood pressure. The first number is called systolic pressure. The second is called diastolic pressure. An example of a blood pressure reading is "120 over 80" (or 120/80). If you are an adult and do not have a medical condition, use this guide to find out if your blood pressure is normal: Normal  First number: below 120.  Second number: below 80. Elevated  First number: 120-129.  Second number: below 80. Hypertension stage 1  First number: 130-139.  Second number: 80-89. Hypertension stage 2  First number: 140 or above.  Second number: 45 or above. Your blood pressure is above normal even if only the top or bottom number is above normal. Follow these instructions at home:  Check your blood pressure as often as your doctor tells you to.  Take your monitor to your next doctor's appointment. Your doctor  will: ? Make sure you are using it correctly. ? Make sure it is working right.  Make sure you understand what your blood pressure numbers should be.  Tell your doctor if your medicines are causing side effects. Contact a doctor if:  Your blood pressure keeps being high. Get help right away if:  Your first blood pressure number is higher than 180.  Your second blood pressure number is higher than 120. This information is not intended to replace advice given to you by your health care provider. Make sure you discuss any questions you have with your health care provider. Document  Released: 12/18/2007 Document Revised: 12/03/2015 Document Reviewed: 06/13/2015 Elsevier Interactive Patient Education  [employer].   If you have lab work done today you will be contacted with your lab results within the next 2 weeks.  If you have not heard from [place] then please contact us. The fastest way to get your results is to register for My Chart.   IF you received an x-ray today, you will receive an invoice from [facility]. Please contact [facility] at [phone] with questions or concerns regarding your invoice.   IF you received labwork today, you will receive an invoice from [facility]. Please contact [facility] at [phone] with questions or concerns regarding your invoice.   Our billing staff will not be able to assist you with questions regarding bills from these companies.  You will be contacted with the lab results as soon as they are available. The fastest way to get your results is to activate your My Chart account. Instructions are located on the last page of this paperwork. If you have not heard from [place] regarding the results in 2 weeks, please contact this office.

## 2018-06-19 NOTE — Progress Notes (Signed)
CC- Patient stated he is doing well. He have not been having any issus with his blood pressure or medication for blood pressure. Patient stated he has not been taking his bp at home due to his machine is broken.

## 2018-06-21 ENCOUNTER — Ambulatory Visit (INDEPENDENT_AMBULATORY_CARE_PROVIDER_SITE_OTHER): Payer: 59 | Admitting: Family Medicine

## 2018-06-21 ENCOUNTER — Other Ambulatory Visit: Payer: Self-pay

## 2018-06-21 DIAGNOSIS — I10 Essential (primary) hypertension: Secondary | ICD-10-CM

## 2018-06-21 DIAGNOSIS — E785 Hyperlipidemia, unspecified: Secondary | ICD-10-CM

## 2018-06-22 LAB — COMPREHENSIVE METABOLIC PANEL
ALT: 27 IU/L (ref 0–44)
AST: 26 IU/L (ref 0–40)
Albumin/Globulin Ratio: 1.6 (ref 1.2–2.2)
Albumin: 4.1 g/dL (ref 4.0–5.0)
Alkaline Phosphatase: 76 IU/L (ref 39–117)
BUN/Creatinine Ratio: 12 (ref 9–20)
BUN: 14 mg/dL (ref 6–20)
Bilirubin Total: 0.5 mg/dL (ref 0.0–1.2)
CO2: 20 mmol/L (ref 20–29)
Calcium: 9.3 mg/dL (ref 8.7–10.2)
Chloride: 105 mmol/L (ref 96–106)
Creatinine, Ser: 1.14 mg/dL (ref 0.76–1.27)
GFR calc Af Amer: 96 mL/min/{1.73_m2} (ref >59)
GFR calc non Af Amer: 83 mL/min/{1.73_m2} (ref >59)
Globulin, Total: 2.5 g/dL (ref 1.5–4.5)
Glucose: 88 mg/dL (ref 65–99)
Potassium: 4.5 mmol/L (ref 3.5–5.2)
Sodium: 141 mmol/L (ref 134–144)
Total Protein: 6.6 g/dL (ref 6.0–8.5)

## 2018-06-22 LAB — LIPID PANEL
Chol/HDL Ratio: 5.2 ratio — ABNORMAL HIGH (ref 0.0–5.0)
Cholesterol, Total: 176 mg/dL (ref 100–199)
HDL: 34 mg/dL — ABNORMAL LOW (ref >39)
LDL Calculated: 124 mg/dL — ABNORMAL HIGH (ref 0–99)
Triglycerides: 90 mg/dL (ref 0–149)
VLDL Cholesterol Cal: 18 mg/dL (ref 5–40)

## 2018-12-20 ENCOUNTER — Other Ambulatory Visit: Payer: Self-pay

## 2018-12-20 ENCOUNTER — Encounter: Payer: Self-pay | Admitting: Family Medicine

## 2018-12-20 ENCOUNTER — Ambulatory Visit: Payer: 59 | Admitting: Family Medicine

## 2018-12-20 VITALS — BP 162/90 | HR 112 | Temp 98.2°F | Wt 276.2 lb

## 2018-12-20 DIAGNOSIS — I10 Essential (primary) hypertension: Secondary | ICD-10-CM

## 2018-12-20 DIAGNOSIS — Z23 Encounter for immunization: Secondary | ICD-10-CM

## 2018-12-20 DIAGNOSIS — E669 Obesity, unspecified: Secondary | ICD-10-CM

## 2018-12-20 DIAGNOSIS — E785 Hyperlipidemia, unspecified: Secondary | ICD-10-CM | POA: Diagnosis not present

## 2018-12-20 DIAGNOSIS — L853 Xerosis cutis: Secondary | ICD-10-CM

## 2018-12-20 MED ORDER — LISINOPRIL 10 MG PO TABS: 10.0000 mg | ORAL_TABLET | Freq: Every day | ORAL | 2 refills | Status: DC

## 2018-12-20 NOTE — Patient Instructions (Addendum)
Dry skin care: Drink at least 64 ounces of water daily. Consider a humidifier for the room where you sleep. Bathe once daily. Avoid using HOT water, as it dries skin.  Avoid deodorant soaps (Dial is the worst!) and stick with gentle cleansers (I like Cetaphil Liquid Cleanser). After bathing, dry off completely, then apply a thick emollient cream (I like Cetaphil Moisturizing Cream, or Eucerin). Apply the cream twice daily, or more.   Increase lisinopril to 10 mg each day, monitor home blood pressure readings.  If you get lightheaded, dizzy, or low readings, return to 5 mg and follow-up with me to discuss further.  Recheck in 6 months for repeat lab work.  Watch portion sizes, especially at restaurants.  Try to cut back on sugar-containing beverages including sweet tea.  Water is best.  Some form of exercise/activity most days per week - ideally 150 minutes total per week can also help with weight, blood pressure, cholesterol.    Thanks for coming in today and take care.    If you have lab work done today you will be contacted with your lab results within the next 2 weeks.  If you have not heard from [place] then please contact us. The fastest way to get your results is to register for My Chart.   IF you received an x-ray today, you will receive an invoice from [facility]. Please contact [facility] at [phone] with questions or concerns regarding your invoice.   IF you received labwork today, you will receive an invoice from [facility]. Please contact [facility] at [phone] with questions or concerns regarding your invoice.   Our billing staff will not be able to assist you with questions regarding bills from these companies.  You will be contacted with the lab results as soon as they are available. The fastest way to get your results is to activate your My Chart account. Instructions are located on the last page of this paperwork. If you have not heard from [place] regarding the  results in 2 weeks, please contact this office.

## 2018-12-20 NOTE — Progress Notes (Signed)
Subjective:  Patient ID: Bradley Morales, male    DOB: Jan 13, 1981  Age: 38 y.o. MRN: [id number]  CC:  Chief Complaint  Patient presents with  . Medical Management of Chronic Issues    f/u on chronic medical condition    HPI Bradley C Delon presents for   Hypertension: Lisinopril 5 mg daily. No new side effects with meds, no cough.  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 Home readings:no recent home readings. BP Readings from Last 3 Encounters:  12/20/18 (!) 162/90  12/27/17 135/87  09/30/17 118/90   Lab Results  Component Value Date   CREATININE 1.14 06/21/2018   Hyperlipidemia: Mild elevation in June.  Fasting today.   Lab Results  Component Value Date   CHOL 176 06/21/2018   HDL 34 (L) 06/21/2018   LDLCALC 124 (H) 06/21/2018   TRIG 90 06/21/2018   CHOLHDL 5.2 (H) 06/21/2018   Lab Results  Component Value Date   ALT 27 06/21/2018   AST 26 06/21/2018   ALKPHOS 76 06/21/2018   BILITOT 0.5 06/21/2018   Obstructive sleep apnea  AHI 9.2 in 2015, minimal improvement when he did use CPAP previously.  No snoring or daytime somnolence when discussed June 1  Obesity Body mass index is 39.63 kg/m. Wt Readings from Last 3 Encounters:  12/20/18 276 lb 3.2 oz (125.3 kg)  12/27/17 267 lb 3.2 oz (121.2 kg)  09/30/17 275 lb (124.7 kg)  no regular exercise.  Fast food: on weekends, restaurant food - [employer] for lunch 3-4 times per week. Sweet tea - 2 per day. Rare soda.   Dry Skin Legs, during winter. Uses liquid soap with charcoal in it for scrubbing.  Lotion after shower - [employer]   History Patient Active Problem List   Diagnosis Date Noted  . Obesity due to excess calories   . Snoring disorder   . HTN (hypertension) 08/30/2012  . Palpitations 04/06/2012  . OSA (obstructive sleep apnea) 03/06/2012  . Nicotine addiction 11/05/2011  . BMI 45.0-49.9, adult ([place]) 11/05/2011   Past Medical History:  Diagnosis Date  . Bell's palsy   . Hypertension   . Obesity  due to excess calories   . Snoring disorder    Past Surgical History:  Procedure Laterality Date  . CYST EXCISION N/A 09/30/2017   Procedure: EXCISION OF POSTERIOR NECK CYST AND LOWER BACK CYST;  Surgeon: Ralene Ok, MD;  Location: [facility];  Service: General;  Laterality: N/A;  . VASECTOMY    . WISDOM TOOTH EXTRACTION     No Known Allergies Prior to Admission medications   Medication Sig Start Date End Date Taking? Authorizing Provider  lisinopril (ZESTRIL) 5 MG tablet Take 1 tablet (5 mg total) by mouth daily. 06/19/18   [name], MD   Social History   Socioeconomic History  . Marital status: Married    Spouse name: Melissa  . Number of children: 1  . Years of education: 56  . Highest education level: Not on file  Occupational History  . 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 Occupation: Music therapist: [employer]  Social Needs  . Financial resource strain: Not on file  . Food insecurity    Worry: Not on file    Inability: Not on file  . Transportation needs    Medical: Not on file    Non-medical: Not on file  Tobacco Use  . Smoking status: Current Every Day Smoker  Packs/day: 1.00    Years: 10.00    Pack years: 10.00    Types: Cigarettes  . Smokeless tobacco: Never Used  Substance and Sexual Activity  . Alcohol use: Yes    Comment: rarely  . Drug use: No  . Sexual activity: Yes    Birth control/protection: None    Comment: 1 partner in last 12 months  Lifestyle  . Physical activity    Days per week: Not on file    Minutes per session: Not on file  . Stress: Not on file  Relationships  . Social Herbalist on phone: Not on file    Gets together: Not on file    Attends religious service: Not on file    Active member of club or organization: Not on file    Attends meetings of clubs or organizations: Not on file    Relationship status: Not on file  . Intimate partner violence    Fear of current or ex  partner: Not on file    Emotionally abused: Not on file    Physically abused: Not on file    Forced sexual activity: Not on file  Other Topics Concern  . Not on file  Social History Narrative   Patient is married Programme researcher, broadcasting/film/video) and lives at home with his family.   Patient has one child.   Patient is working full-time.   Patient has a high school education.   Patient is right-handed.   Patient drinks 3 cups of soda per day.    Review of Systems  Constitutional: Negative for fatigue and unexpected weight change.  Eyes: Negative for visual disturbance.  Respiratory: Negative for cough, chest tightness and shortness of breath.   Cardiovascular: Negative for chest pain, palpitations and leg swelling.  Gastrointestinal: Negative for abdominal pain and blood in stool.  Neurological: Negative for dizziness, light-headedness and headaches.     Objective:   Vitals:   12/20/18 0820 12/20/18 0822  BP: (!) 174/94 (!) 162/90  Pulse: (!) 112   Temp: 98.2 F (36.8 C)   TempSrc: Oral   SpO2: 99%   Weight: 276 lb 3.2 oz (125.3 kg)      Physical Exam Vitals signs reviewed.  Constitutional:      Appearance: He is well-developed. He is obese.  HENT:     Head: Normocephalic and atraumatic.  Eyes:     Pupils: Pupils are equal, round, and reactive to light.  Neck:     Vascular: No carotid bruit or JVD.  Cardiovascular:     Rate and Rhythm: Normal rate and regular rhythm.     Heart sounds: Normal heart sounds. No murmur.  Pulmonary:     Effort: Pulmonary effort is normal.     Breath sounds: Normal breath sounds. No rales.  Skin:    General: Skin is warm and dry.  Neurological:     Mental Status: He is alert and oriented to person, place, and time.        Assessment & Plan:  Bradley C Siverson is a 38 y.o. male . Essential hypertension - Plan: Comprehensive metabolic panel, lisinopril (ZESTRIL) 10 MG tablet  -Borderline at recent DOT physical, elevated today.  May have component of  whitecoat hypertension but will increase lisinopril to 10mg  qd.  Orthostatic precautions discussed, with RTC precautions and recheck in 72-months  Need for prophylactic vaccination and inoculation against influenza - Plan: Flu Vaccine QUAD 6+ mos PF IM (Fluarix Quad PF)  Hyperlipidemia, unspecified hyperlipidemia  type - Plan: Comprehensive metabolic panel, Lipid Panel  -Mild elevation previously.  Weight loss discussed initial approach and dietary given.  Check labs today  Dry skin  -Hydrating lotion discussed including repeat applications throughout the day.  RTC precautions if new rash  Obesity (BMI 30-39.9)  -Dietary guidance discussed as well as increased physical activity.  Meds ordered this encounter  Medications  . lisinopril (ZESTRIL) 10 MG tablet    Sig: Take 1 tablet (10 mg total) by mouth daily.    Dispense:  90 tablet    Refill:  2   Patient Instructions   Dry skin care: Drink at least 64 ounces of water daily. Consider a humidifier for the room where you sleep. Bathe once daily. Avoid using HOT water, as it dries skin.  Avoid deodorant soaps (Dial is the worst!) and stick with gentle cleansers (I like Cetaphil Liquid Cleanser). After bathing, dry off completely, then apply a thick emollient cream (I like Cetaphil Moisturizing Cream, or Eucerin). Apply the cream twice daily, or more.   Increase lisinopril to 10 mg each day, monitor home blood pressure readings.  If you get lightheaded, dizzy, or low readings, return to 5 mg and follow-up with me to discuss further.  Recheck in 6 months for repeat lab work.  Watch portion sizes, especially at restaurants.  Try to cut back on sugar-containing beverages including sweet tea.  Water is best.  Some form of exercise/activity most days per week - ideally 150 minutes total per week can also help with weight, blood pressure, cholesterol.    Thanks for coming in today and take care.    If you have lab work done today you will be  contacted with your lab results within the next 2 weeks.  If you have not heard from [place] then please contact us. The fastest way to get your results is to register for My Chart.   IF you received an x-ray today, you will receive an invoice from [facility]. Please contact [facility] at [phone] with questions or concerns regarding your invoice.   IF you received labwork today, you will receive an invoice from [place]. Please contact [facility] at [phone] with questions or concerns regarding your invoice.   Our billing staff will not be able to assist you with questions regarding bills from these companies.  You will be contacted with the lab results as soon as they are available. The fastest way to get your results is to activate your My Chart account. Instructions are located on the last page of this paperwork. If you have not heard from [place] regarding the results in 2 weeks, please contact this office.          Signed, Merri Ray, MD [facility]

## 2018-12-21 LAB — COMPREHENSIVE METABOLIC PANEL
ALT: 24 IU/L (ref 0–44)
AST: 18 IU/L (ref 0–40)
Albumin/Globulin Ratio: 2.1 (ref 1.2–2.2)
Albumin: 4.8 g/dL (ref 4.0–5.0)
Alkaline Phosphatase: 88 IU/L (ref 39–117)
BUN/Creatinine Ratio: 8 — ABNORMAL LOW (ref 9–20)
BUN: 9 mg/dL (ref 6–20)
Bilirubin Total: 0.8 mg/dL (ref 0.0–1.2)
CO2: 22 mmol/L (ref 20–29)
Calcium: 9.7 mg/dL (ref 8.7–10.2)
Chloride: 106 mmol/L (ref 96–106)
Creatinine, Ser: 1.07 mg/dL (ref 0.76–1.27)
GFR calc Af Amer: 103 mL/min/{1.73_m2} (ref >59)
GFR calc non Af Amer: 89 mL/min/{1.73_m2} (ref >59)
Globulin, Total: 2.3 g/dL (ref 1.5–4.5)
Glucose: 94 mg/dL (ref 65–99)
Potassium: 5.5 mmol/L — ABNORMAL HIGH (ref 3.5–5.2)
Sodium: 144 mmol/L (ref 134–144)
Total Protein: 7.1 g/dL (ref 6.0–8.5)

## 2018-12-21 LAB — LIPID PANEL
Chol/HDL Ratio: 4.3 ratio (ref 0.0–5.0)
Cholesterol, Total: 159 mg/dL (ref 100–199)
HDL: 37 mg/dL — ABNORMAL LOW (ref >39)
LDL Chol Calc (NIH): 104 mg/dL — ABNORMAL HIGH (ref 0–99)
Triglycerides: 99 mg/dL (ref 0–149)
VLDL Cholesterol Cal: 18 mg/dL (ref 5–40)

## 2019-01-02 ENCOUNTER — Other Ambulatory Visit: Payer: Self-pay | Admitting: Family Medicine

## 2019-01-02 DIAGNOSIS — E875 Hyperkalemia: Secondary | ICD-10-CM

## 2019-01-02 NOTE — Progress Notes (Signed)
See lab notes

## 2019-06-22 ENCOUNTER — Other Ambulatory Visit: Payer: Self-pay

## 2019-06-22 ENCOUNTER — Ambulatory Visit: Payer: 59 | Admitting: Family Medicine

## 2019-06-22 ENCOUNTER — Encounter: Payer: Self-pay | Admitting: Family Medicine

## 2019-06-22 VITALS — BP 128/84 | HR 110 | Temp 98.0°F | Ht 70.0 in | Wt 278.0 lb

## 2019-06-22 DIAGNOSIS — I10 Essential (primary) hypertension: Secondary | ICD-10-CM

## 2019-06-22 DIAGNOSIS — E785 Hyperlipidemia, unspecified: Secondary | ICD-10-CM

## 2019-06-22 DIAGNOSIS — Z131 Encounter for screening for diabetes mellitus: Secondary | ICD-10-CM | POA: Diagnosis not present

## 2019-06-22 DIAGNOSIS — E669 Obesity, unspecified: Secondary | ICD-10-CM | POA: Diagnosis not present

## 2019-06-22 LAB — COMPREHENSIVE METABOLIC PANEL
ALT: 38 IU/L (ref 0–44)
AST: 31 IU/L (ref 0–40)
Albumin/Globulin Ratio: 2 (ref 1.2–2.2)
Albumin: 4.7 g/dL (ref 4.0–5.0)
Alkaline Phosphatase: 84 IU/L (ref 48–121)
BUN/Creatinine Ratio: 14 (ref 9–20)
BUN: 16 mg/dL (ref 6–20)
Bilirubin Total: 0.5 mg/dL (ref 0.0–1.2)
CO2: 22 mmol/L (ref 20–29)
Calcium: 9.6 mg/dL (ref 8.7–10.2)
Chloride: 105 mmol/L (ref 96–106)
Creatinine, Ser: 1.17 mg/dL (ref 0.76–1.27)
GFR calc Af Amer: 92 mL/min/{1.73_m2} (ref >59)
GFR calc non Af Amer: 80 mL/min/{1.73_m2} (ref >59)
Globulin, Total: 2.3 g/dL (ref 1.5–4.5)
Glucose: 81 mg/dL (ref 65–99)
Potassium: 5.2 mmol/L (ref 3.5–5.2)
Sodium: 141 mmol/L (ref 134–144)
Total Protein: 7 g/dL (ref 6.0–8.5)

## 2019-06-22 LAB — LIPID PANEL
Chol/HDL Ratio: 5.8 ratio — ABNORMAL HIGH (ref 0.0–5.0)
Cholesterol, Total: 186 mg/dL (ref 100–199)
HDL: 32 mg/dL — ABNORMAL LOW (ref >39)
LDL Chol Calc (NIH): 132 mg/dL — ABNORMAL HIGH (ref 0–99)
Triglycerides: 121 mg/dL (ref 0–149)
VLDL Cholesterol Cal: 22 mg/dL (ref 5–40)

## 2019-06-22 LAB — HEMOGLOBIN A1C
Est. average glucose Bld gHb Est-mCnc: 108 mg/dL
Hgb A1c MFr Bld: 5.4 % (ref 4.8–5.6)

## 2019-06-22 MED ORDER — LISINOPRIL 10 MG PO TABS: 10.0000 mg | ORAL_TABLET | Freq: Every day | ORAL | 2 refills | Status: DC

## 2019-06-22 NOTE — Progress Notes (Signed)
Subjective:  Patient ID: Bradley Morales, male    DOB: 1980-03-09  Age: 39 y.o. MRN: [id number]  CC:  Chief Complaint  Patient presents with  . Hypertension    pt reports he dosen't feel any issues with his BP, but he dose not check his BP. pt reports taking his medication as directed with no side effects. when asked pt says no to all phyisical symptoms of hypertension.    HPI Bradley Morales presents for   Had covid vaccines at work.   Hypertension: Lisinopril was increased in December to 10 mg daily. No new side effects at this dose.  No new cough.  Home readings:none.  BP Readings from Last 3 Encounters:  06/22/19 128/84  12/20/18 (!) 162/90  12/27/17 135/87   Lab Results  Component Value Date   CREATININE 1.07 12/20/2018   Hyperkalemia: Potassium 5.5 December 2, advised to return for lab only visit which was not done. No palpitations. No supplements.   Hyperlipidemia: Mild elevation previously, plan for diet/exercise approach. Fasting today.   Lab Results  Component Value Date   CHOL 159 12/20/2018   HDL 37 (L) 12/20/2018   LDLCALC 104 (H) 12/20/2018   TRIG 99 12/20/2018   CHOLHDL 4.3 12/20/2018   Lab Results  Component Value Date   ALT 24 12/20/2018   AST 18 12/20/2018   ALKPHOS 88 12/20/2018   BILITOT 0.8 12/20/2018    Obesity No changes since last visit. Same work around the farm.  Fast food, restaurant food and sweet tea were discussed last visit.  Weight has increased 2 pounds since December.  Body mass index is 39.89 kg/m. Wt Readings from Last 3 Encounters:  06/22/19 278 lb (126.1 kg)  12/20/18 276 lb 3.2 oz (125.3 kg)  12/27/17 267 lb 3.2 oz (121.2 kg)      History Patient Active Problem List   Diagnosis Date Noted  . Obesity due to excess calories   . Snoring disorder   . HTN (hypertension) 08/30/2012  . Palpitations 04/06/2012  . OSA (obstructive sleep apnea) 03/06/2012  . Nicotine addiction 11/05/2011  . BMI 45.0-49.9, adult ([place])  11/05/2011   Past Medical History:  Diagnosis Date  . Bell's palsy   . Hypertension   . Obesity due to excess calories   . Snoring disorder    Past Surgical History:  Procedure Laterality Date  . CYST EXCISION N/A 09/30/2017   Procedure: EXCISION OF POSTERIOR NECK CYST AND LOWER BACK CYST;  Surgeon: Ralene Ok, MD;  Location: [facility];  Service: General;  Laterality: N/A;  . VASECTOMY    . WISDOM TOOTH EXTRACTION     No Known Allergies Prior to Admission medications   Medication Sig Start Date End Date Taking? Authorizing Provider  lisinopril (ZESTRIL) 10 MG tablet Take 1 tablet (10 mg total) by mouth daily. 12/20/18   [name], MD   Social History   Socioeconomic History  . Marital status: Married    Spouse name: Melissa  . Number of children: 1  . Years of education: 4  . Highest education level: Not on file  Occupational History  . 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 Occupation: Music therapist: [employer]  Tobacco Use  . Smoking status: Current Every Day Smoker    Packs/day: 1.00    Years: 10.00    Pack years: 10.00    Types: Cigarettes  . Smokeless tobacco: Never Used  Substance and  Sexual Activity  . Alcohol use: Yes    Comment: rarely  . Drug use: No  . Sexual activity: Yes    Birth control/protection: None    Comment: 1 partner in last 12 months  Other Topics Concern  . Not on file  Social History Narrative   Patient is married Programme researcher, broadcasting/film/video) and lives at home with his family.   Patient has one child.   Patient is working full-time.   Patient has a high school education.   Patient is right-handed.   Patient drinks 3 cups of soda per day.   Social Determinants of Health   Financial Resource Strain:   . Difficulty of Paying Living Expenses:   Food Insecurity:   . Worried About Charity fundraiser in the Last Year:   . Arboriculturist in the Last Year:   Transportation Needs:   . Film/video editor  (Medical):   Marland Kitchen Lack of Transportation (Non-Medical):   Physical Activity:   . Days of Exercise per Week:   . Minutes of Exercise per Session:   Stress:   . Feeling of Stress :   Social Connections:   . Frequency of Communication with Friends and Family:   . Frequency of Social Gatherings with Friends and Family:   . Attends Religious Services:   . Active Member of Clubs or Organizations:   . Attends Archivist Meetings:   Marland Kitchen Marital Status:   Intimate Partner Violence:   . Fear of Current or Ex-Partner:   . Emotionally Abused:   Marland Kitchen Physically Abused:   . Sexually Abused:     Review of Systems  Constitutional: Negative for fatigue and unexpected weight change.  Eyes: Negative for visual disturbance.  Respiratory: Negative for cough, chest tightness and shortness of breath.   Cardiovascular: Negative for chest pain, palpitations and leg swelling.  Gastrointestinal: Negative for abdominal pain and blood in stool.  Neurological: Negative for dizziness, light-headedness and headaches.     Objective:   Vitals:   06/22/19 0804 06/22/19 0807  BP: (!) 145/90 128/84  Pulse: (!) 110   Temp: 98 F (36.7 C)   TempSrc: Temporal   SpO2: 99%   Weight: 278 lb (126.1 kg)   Height: 5\' 10"  (1.778 m)      Physical Exam Vitals reviewed.  Constitutional:      Appearance: He is well-developed.  HENT:     Head: Normocephalic and atraumatic.  Eyes:     Pupils: Pupils are equal, round, and reactive to light.  Neck:     Vascular: No carotid bruit or JVD.  Cardiovascular:     Rate and Rhythm: Normal rate and regular rhythm.     Heart sounds: Normal heart sounds. No murmur.  Pulmonary:     Effort: Pulmonary effort is normal.     Breath sounds: Normal breath sounds. No rales.  Skin:    General: Skin is warm and dry.  Neurological:     Mental Status: He is alert and oriented to person, place, and time.        Assessment & Plan:  Bradley Morales is a 39 y.o. male  . Essential hypertension - Plan: Lipid panel, Comprehensive metabolic panel, lisinopril (ZESTRIL) 10 MG tablet  -  Stable, tolerating current regimen. Medications refilled. Labs pending as above.   Hyperlipidemia, unspecified hyperlipidemia type - Plan: Lipid panel, Comprehensive metabolic panel  -Mild elevation previously.  Repeat labs, anticipate improvement with weight loss.    Obesity (  BMI 30-39.9) - Plan: Hemoglobin A1c  -Discussed diet choices including avoidance of late night meals, smaller portions at dinner, healthy snacks during the day.  Avoidance of sugar-containing beverages, fast food as much as possible.    Screening for diabetes mellitus (DM) - Plan: Hemoglobin A1c   Meds ordered this encounter  Medications  . lisinopril (ZESTRIL) 10 MG tablet    Sig: Take 1 tablet (10 mg total) by mouth daily.    Dispense:  90 tablet    Refill:  2   Patient Instructions    Continue to try to decrease sugar-containing beverages and large meals before bedtime.  Do your best to try to spread out calories during the middle of the day, including smaller meals or healthy snacks throughout the day if needed.  Blood pressure looks okay today.  No change in medications.  Follow-up in 6 months.  Take care.     If you have lab work done today you will be contacted with your lab results within the next 2 weeks.  If you have not heard from [place] then please contact us. The fastest way to get your results is to register for My Chart.   IF you received an x-ray today, you will receive an invoice from [facility]. Please contact [facility] at [phone] with questions or concerns regarding your invoice.   IF you received labwork today, you will receive an invoice from [facility]. Please contact [facility] at [phone] with questions or concerns regarding your invoice.   Our billing staff will not be able to assist you with questions regarding bills from these companies.  You  will be contacted with the lab results as soon as they are available. The fastest way to get your results is to activate your My Chart account. Instructions are located on the last page of this paperwork. If you have not heard from [place] regarding the results in 2 weeks, please contact this office.         Signed, Merri Ray, MD [facility]

## 2019-06-22 NOTE — Patient Instructions (Addendum)
  Continue to try to decrease sugar-containing beverages and large meals before bedtime.  Do your best to try to spread out calories during the middle of the day, including smaller meals or healthy snacks throughout the day if needed.  Blood pressure looks okay today.  No change in medications.  Follow-up in 6 months.  Take care.     If you have lab work done today you will be contacted with your lab results within the next 2 weeks.  If you have not heard from [place] then please contact us. The fastest way to get your results is to register for My Chart.   IF you received an x-ray today, you will receive an invoice from [facility]. Please contact [facility] at [phone] with questions or concerns regarding your invoice.   IF you received labwork today, you will receive an invoice from [facility]. Please contact [facility] at [phone] with questions or concerns regarding your invoice.   Our billing staff will not be able to assist you with questions regarding bills from these companies.  You will be contacted with the lab results as soon as they are available. The fastest way to get your results is to activate your My Chart account. Instructions are located on the last page of this paperwork. If you have not heard from [place] regarding the results in 2 weeks, please contact this office.

## 2019-12-27 ENCOUNTER — Ambulatory Visit: Payer: 59 | Admitting: Family Medicine

## 2020-01-31 ENCOUNTER — Ambulatory Visit: Payer: 59 | Admitting: Family Medicine

## 2020-01-31 ENCOUNTER — Other Ambulatory Visit: Payer: Self-pay

## 2020-01-31 ENCOUNTER — Encounter: Payer: Self-pay | Admitting: Family Medicine

## 2020-01-31 VITALS — BP 132/86 | HR 93 | Temp 98.6°F | Ht 70.0 in | Wt 278.0 lb

## 2020-01-31 DIAGNOSIS — D2230 Melanocytic nevi of unspecified part of face: Secondary | ICD-10-CM | POA: Diagnosis not present

## 2020-01-31 DIAGNOSIS — E785 Hyperlipidemia, unspecified: Secondary | ICD-10-CM | POA: Diagnosis not present

## 2020-01-31 DIAGNOSIS — Z23 Encounter for immunization: Secondary | ICD-10-CM

## 2020-01-31 DIAGNOSIS — Z6839 Body mass index (BMI) 39.0-39.9, adult: Secondary | ICD-10-CM

## 2020-01-31 DIAGNOSIS — I10 Essential (primary) hypertension: Secondary | ICD-10-CM | POA: Diagnosis not present

## 2020-01-31 LAB — COMPREHENSIVE METABOLIC PANEL
Albumin: 4.9 g/dL (ref 4.0–5.0)
Globulin, Total: 2.7 g/dL (ref 1.5–4.5)

## 2020-01-31 MED ORDER — LISINOPRIL 10 MG PO TABS: 10.0000 mg | ORAL_TABLET | Freq: Every day | ORAL | 2 refills | Status: DC

## 2020-01-31 NOTE — Progress Notes (Signed)
Subjective:  Patient ID: Bradley Morales, male    DOB: 08/17/1980  Age: 40 y.o. MRN: [id number]  CC:  Chief Complaint  Patient presents with  . Follow-up    On hypertension and hyperlipidemia. Pt reports no known issues with BP since last OV. Pt reports his home readings are around today's office reading of 132/86.Pt reports no physical symptoms at this time.  Marland Kitchen SKIN CHECK    Pt has a spot on his L side of his face on the lower part of his cheek he would like the provider to look at. No pain or irritation.    HPI Bradley Morales presents for   Hypertension: Lisinopril 10mg  qd. No new side effects.  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  Rare fast food - once per week rare soda.  Home readings: 130-140/80.  Hx of OSA. Unable to tolerate machine. "borderline" in past. Study in 2015 - mild REM dependent sleep apnea. No snoring, no daytime somnolence.  Body mass index is 39.89 kg/m.   BP Readings from Last 3 Encounters:  01/31/20 132/86  06/22/19 128/84  12/20/18 (!) 162/90   Lab Results  Component Value Date   CREATININE 1.17 06/22/2019    Hyperlipidemia: The ASCVD Risk score Mikey Bussing DC Jr., et al., 2013) failed to calculate for the following reasons:   The 2013 ASCVD risk score is only valid for ages 24 to 33   No FH of early CAD/CVD.  Lab Results  Component Value Date   CHOL 186 06/22/2019   HDL 32 (L) 06/22/2019   LDLCALC 132 (H) 06/22/2019   TRIG 121 06/22/2019   CHOLHDL 5.8 (H) 06/22/2019   Lab Results  Component Value Date   ALT 38 06/22/2019   AST 31 06/22/2019   ALKPHOS 84 06/22/2019   BILITOT 0.5 06/22/2019   L face lesion: noticed spot on left face 1 month ago - bled with trying to pop. No change in size since that time.  Has not seen derm.  Father had skin CA.      History Patient Active Problem List    Diagnosis Date Noted  . Obesity due to excess calories   . Snoring disorder   . HTN (hypertension) 08/30/2012  . Palpitations 04/06/2012  . OSA (obstructive sleep apnea) 03/06/2012  . Nicotine addiction 11/05/2011  . BMI 45.0-49.9, adult ([place]) 11/05/2011   Past Medical History:  Diagnosis Date  . Bell's palsy   . Hypertension   . Obesity due to excess calories   . Snoring disorder    Past Surgical History:  Procedure Laterality Date  . CYST EXCISION N/A 09/30/2017   Procedure: EXCISION OF POSTERIOR NECK CYST AND LOWER BACK CYST;  Surgeon: Ralene Ok, MD;  Location: [facility];  Service: General;  Laterality: N/A;  . VASECTOMY    . WISDOM TOOTH EXTRACTION     No Known Allergies Prior to Admission medications   Medication Sig Start Date End Date Taking? Authorizing Provider  lisinopril (ZESTRIL) 10 MG tablet Take 1 tablet (10 mg total) by mouth daily. 06/22/19  Yes [name], MD   Social History   Socioeconomic History  . Marital status: Married    Spouse name: Melissa  . Number of children: 1  . Years of education: 43  . Highest education level: Not  on file  Occupational History  . 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 Occupation: Music therapist: [employer]  Tobacco Use  . Smoking status: Current Every Day Smoker    Packs/day: 1.00    Years: 10.00    Pack years: 10.00    Types: Cigarettes  . Smokeless tobacco: Never Used  Vaping Use  . Vaping Use: Former  Substance and Sexual Activity  . Alcohol use: Yes    Comment: rarely  . Drug use: No  . Sexual activity: Yes    Birth control/protection: None    Comment: 1 partner in last 12 months  Other Topics Concern  . Not on file  Social History Narrative   Patient is married Programme researcher, broadcasting/film/video) and lives at home with his family.   Patient has one child.   Patient is working full-time.   Patient has a high school education.   Patient is right-handed.   Patient drinks 3 cups of  soda per day.   Social Determinants of Health   Financial Resource Strain: Not on file  Food Insecurity: Not on file  Transportation Needs: Not on file  Physical Activity: Not on file  Stress: Not on file  Social Connections: Not on file  Intimate Partner Violence: Not on file    Review of Systems  Constitutional: Negative for fatigue and unexpected weight change.  Eyes: Negative for visual disturbance.  Respiratory: Negative for cough, chest tightness and shortness of breath.   Cardiovascular: Negative for chest pain, palpitations and leg swelling.  Gastrointestinal: Negative for abdominal pain and blood in stool.  Neurological: Negative for dizziness, light-headedness and headaches.     Objective:   Vitals:   01/31/20 1051  BP: 132/86  Pulse: 93  Temp: 98.6 F (37 C)  TempSrc: Temporal  SpO2: 100%  Weight: 278 lb (126.1 kg)  Height: 5\' 10"  (1.778 m)     Physical Exam Vitals reviewed.  Constitutional:      Appearance: He is well-developed and well-nourished.  HENT:     Head: Normocephalic and atraumatic.  Eyes:     Extraocular Movements: EOM normal.     Pupils: Pupils are equal, round, and reactive to light.  Neck:     Vascular: No carotid bruit or JVD.  Cardiovascular:     Rate and Rhythm: Normal rate and regular rhythm.     Heart sounds: Normal heart sounds. No murmur heard.   Pulmonary:     Effort: Pulmonary effort is normal.     Breath sounds: Normal breath sounds. No rales.  Musculoskeletal:        General: No edema.  Skin:    General: Skin is warm and dry.       Neurological:     Mental Status: He is alert and oriented to person, place, and time.  Psychiatric:        Mood and Affect: Mood and affect and mood normal.        Behavior: Behavior normal.        Assessment & Plan:  Bradley Morales is a 40 y.o. male . Hyperlipidemia, unspecified hyperlipidemia type - Plan: Lipid panel  -Check lipids.  Diet/activity for weight loss  discussed.  Need for vaccination - Plan: Td vaccine greater than or equal to 7yo preservative free IM  Essential hypertension - Plan: Comprehensive metabolic panel, lisinopril (ZESTRIL) 10 MG tablet  -  Stable, tolerating current regimen. Medications refilled. Labs pending as above.  Nevus of face - Plan: Ambulatory referral to Dermatology  -Small lesion but will have dermatology evaluate given frequent sun exposure.  BMI 39.0-39.9,adult  Lifestyle modifications as above.    Meds ordered this encounter  Medications  . lisinopril (ZESTRIL) 10 MG tablet    Sig: Take 1 tablet (10 mg total) by mouth daily.    Dispense:  90 tablet    Refill:  2    Refills on file if needed.   Patient Instructions    I will refer you to dermatology. No med changes for now. Watch portion sizes with meals and continue to stay active. Recheck in 6 months.    If you have lab work done today you will be contacted with your lab results within the next 2 weeks.  If you have not heard from [place] then please contact us. The fastest way to get your results is to register for My Chart.   IF you received an x-ray today, you will receive an invoice from [facility]. Please contact [facility] at [phone] with questions or concerns regarding your invoice.   IF you received labwork today, you will receive an invoice from [facility]. Please contact [facility] at [phone] with questions or concerns regarding your invoice.   Our billing staff will not be able to assist you with questions regarding bills from these companies.  You will be contacted with the lab results as soon as they are available. The fastest way to get your results is to activate your My Chart account. Instructions are located on the last page of this paperwork. If you have not heard from [place] regarding the results in 2 weeks, please contact this office.         Signed, Merri Ray, MD [facility]

## 2020-01-31 NOTE — Patient Instructions (Addendum)
  I will refer you to dermatology. No med changes for now. Watch portion sizes with meals and continue to stay active. Recheck in 6 months.    If you have lab work done today you will be contacted with your lab results within the next 2 weeks.  If you have not heard from [place] then please contact us. The fastest way to get your results is to register for My Chart.   IF you received an x-ray today, you will receive an invoice from [facility]. Please contact [facility] at [phone] with questions or concerns regarding your invoice.   IF you received labwork today, you will receive an invoice from [place]. Please contact [facility] at [phone] with questions or concerns regarding your invoice.   Our billing staff will not be able to assist you with questions regarding bills from these companies.  You will be contacted with the lab results as soon as they are available. The fastest way to get your results is to activate your My Chart account. Instructions are located on the last page of this paperwork. If you have not heard from [place] regarding the results in 2 weeks, please contact this office.

## 2020-02-01 LAB — COMPREHENSIVE METABOLIC PANEL
ALT: 25 IU/L (ref 0–44)
AST: 22 IU/L (ref 0–40)
Albumin/Globulin Ratio: 1.8 (ref 1.2–2.2)
Alkaline Phosphatase: 88 IU/L (ref 44–121)
BUN/Creatinine Ratio: 12 (ref 9–20)
BUN: 13 mg/dL (ref 6–20)
Bilirubin Total: 0.9 mg/dL (ref 0.0–1.2)
CO2: 22 mmol/L (ref 20–29)
Calcium: 10 mg/dL (ref 8.7–10.2)
Chloride: 100 mmol/L (ref 96–106)
Creatinine, Ser: 1.13 mg/dL (ref 0.76–1.27)
GFR calc Af Amer: 95 mL/min/{1.73_m2} (ref >59)
GFR calc non Af Amer: 83 mL/min/{1.73_m2} (ref >59)
Glucose: 85 mg/dL (ref 65–99)
Potassium: 4.6 mmol/L (ref 3.5–5.2)
Sodium: 137 mmol/L (ref 134–144)
Total Protein: 7.6 g/dL (ref 6.0–8.5)

## 2020-02-01 LAB — LIPID PANEL
Chol/HDL Ratio: 6.2 ratio — ABNORMAL HIGH (ref 0.0–5.0)
Cholesterol, Total: 210 mg/dL — ABNORMAL HIGH (ref 100–199)
HDL: 34 mg/dL — ABNORMAL LOW (ref >39)
LDL Chol Calc (NIH): 147 mg/dL — ABNORMAL HIGH (ref 0–99)
Triglycerides: 158 mg/dL — ABNORMAL HIGH (ref 0–149)
VLDL Cholesterol Cal: 29 mg/dL (ref 5–40)

## 2020-02-02 ENCOUNTER — Encounter: Payer: Self-pay | Admitting: Family Medicine

## 2020-08-07 ENCOUNTER — Encounter: Payer: Self-pay | Admitting: Family Medicine

## 2020-08-07 ENCOUNTER — Other Ambulatory Visit: Payer: Self-pay

## 2020-08-07 ENCOUNTER — Ambulatory Visit: Payer: 59 | Admitting: Family Medicine

## 2020-08-07 VITALS — BP 126/74 | HR 81 | Temp 98.3°F | Resp 16 | Ht 70.0 in | Wt 278.8 lb

## 2020-08-07 DIAGNOSIS — I10 Essential (primary) hypertension: Secondary | ICD-10-CM

## 2020-08-07 DIAGNOSIS — F17210 Nicotine dependence, cigarettes, uncomplicated: Secondary | ICD-10-CM | POA: Diagnosis not present

## 2020-08-07 DIAGNOSIS — Z6841 Body Mass Index (BMI) 40.0 and over, adult: Secondary | ICD-10-CM | POA: Diagnosis not present

## 2020-08-07 DIAGNOSIS — E785 Hyperlipidemia, unspecified: Secondary | ICD-10-CM | POA: Diagnosis not present

## 2020-08-07 LAB — COMPREHENSIVE METABOLIC PANEL WITH GFR
ALT: 20 U/L (ref 0–53)
AST: 18 U/L (ref 0–37)
Albumin: 4.3 g/dL (ref 3.5–5.2)
Alkaline Phosphatase: 71 U/L (ref 39–117)
BUN: 12 mg/dL (ref 6–23)
CO2: 23 meq/L (ref 19–32)
Calcium: 9.1 mg/dL (ref 8.4–10.5)
Chloride: 105 meq/L (ref 96–112)
Creatinine, Ser: 1.04 mg/dL (ref 0.40–1.50)
GFR: 91.34 mL/min
Glucose, Bld: 70 mg/dL (ref 70–99)
Potassium: 4.5 meq/L (ref 3.5–5.1)
Sodium: 140 meq/L (ref 135–145)
Total Bilirubin: 0.8 mg/dL (ref 0.2–1.2)
Total Protein: 6.7 g/dL (ref 6.0–8.3)

## 2020-08-07 LAB — LIPID PANEL
Cholesterol: 168 mg/dL (ref 0–200)
HDL: 31.5 mg/dL — ABNORMAL LOW (ref >39.00)
LDL Cholesterol: 119 mg/dL — ABNORMAL HIGH (ref 0–99)
NonHDL: 136.5
Total CHOL/HDL Ratio: 5
Triglycerides: 90 mg/dL (ref 0.0–149.0)
VLDL: 18 mg/dL (ref 0.0–40.0)

## 2020-08-07 MED ORDER — LISINOPRIL 10 MG PO TABS: 10.0000 mg | ORAL_TABLET | Freq: Every day | ORAL | 2 refills | Status: DC

## 2020-08-07 NOTE — Progress Notes (Signed)
Subjective:  Patient ID: Bradley Morales, male    DOB: 11/08/1980  Age: 40 y.o. MRN: [id number]  CC:  Chief Complaint  Patient presents with   Hypertension    Pt here for recheck denies physical sxs at this time.    Hyperlipidemia    Pt here for recheck on lab work, no concerns last check elevated     HPI Bradley C Wombles presents for   Hypertension Lisinopril 10mg  qd. No new side effects. 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  BP Readings from Last 3 Encounters:  08/07/20 126/74  01/31/20 132/86  06/22/19 128/84   Lab Results  Component Value Date   CREATININE 1.13 01/31/2020   Hyperlipidemia with obesity: Elevated in January.  No family history of early CAD/CVD.  Diet/activity/lifestyle modification for weight loss discussed. No changes since last visit. Still active with work Fast food: 2 times per week.  Soda - 20-40oz per day. Skipping lunch, and breakfast, large dinner around 7:30-8. Too hot to eat during day.  Fasting today.   Wt Readings from Last 3 Encounters:  08/07/20 278 lb 12.8 oz (126.5 kg)  01/31/20 278 lb (126.1 kg)  06/22/19 278 lb (126.1 kg)    Lab Results  Component Value Date   CHOL 210 (H) 01/31/2020   HDL 34 (L) 01/31/2020   LDLCALC 147 (H) 01/31/2020   TRIG 158 (H) 01/31/2020   CHOLHDL 6.2 (H) 01/31/2020   Lab Results  Component Value Date   ALT 25 01/31/2020   AST 22 01/31/2020   ALKPHOS 88 01/31/2020   BILITOT 0.9 01/31/2020   Nicotine addiction: Not ready to quit. 1 ppd. Health risks discussed.    History Patient Active Problem List   Diagnosis Date Noted   Obesity due to excess calories    Snoring disorder    HTN (hypertension) 08/30/2012   Palpitations 04/06/2012   OSA (obstructive sleep apnea) 03/06/2012   Nicotine addiction 11/05/2011   BMI 45.0-49.9, adult ([place]) 11/05/2011   Past Medical History:  Diagnosis Date   Bell's palsy    Hypertension    Obesity due to excess calories    Snoring disorder    Past Surgical  History:  Procedure Laterality Date   CYST EXCISION N/A 09/30/2017   Procedure: EXCISION OF POSTERIOR NECK CYST AND LOWER BACK CYST;  Surgeon: Ralene Ok, MD;  Location: [facility];  Service: General;  Laterality: N/A;   VASECTOMY     WISDOM TOOTH EXTRACTION     No Known Allergies Prior to Admission medications   Medication Sig Start Date End Date Taking? Authorizing Provider  lisinopril (ZESTRIL) 10 MG tablet Take 1 tablet (10 mg total) by mouth daily. 01/31/20  Yes [name], MD   Social History   Socioeconomic History   Marital status: Married    Spouse name: Melissa   Number of children: 1   Years of education: 12   Highest education level: Not on file  Occupational History   Occupation: Music therapist: [employer]   Occupation: Music therapist: [employer]  Tobacco Use   Smoking status: Every Day    Packs/day: 1.00    Years: 10.00    Pack years: 10.00    Types: Cigarettes   Smokeless tobacco: Never  Vaping Use   Vaping Use: Former  Substance and Sexual Activity   Alcohol use: Yes    Comment: rarely   Drug use: No   Sexual activity:  Yes    Birth control/protection: None    Comment: 1 partner in last 12 months  Other Topics Concern   Not on file  Social History Narrative   Patient is married Programme researcher, broadcasting/film/video) and lives at home with his family.   Patient has one child.   Patient is working full-time.   Patient has a high school education.   Patient is right-handed.   Patient drinks 3 cups of soda per day.   Social Determinants of Health   Financial Resource Strain: Not on file  Food Insecurity: Not on file  Transportation Needs: Not on file  Physical Activity: Not on file  Stress: Not on file  Social Connections: Not on file  Intimate Partner Violence: Not on file    Review of Systems  Constitutional:  Negative for fatigue and unexpected weight change.  Eyes:  Negative for visual disturbance.  Respiratory:  Negative for  cough, chest tightness and shortness of breath.   Cardiovascular:  Negative for chest pain, palpitations and leg swelling.  Gastrointestinal:  Negative for abdominal pain and blood in stool.  Neurological:  Negative for dizziness, light-headedness and headaches.    Objective:   Vitals:   08/07/20 0842  BP: 126/74  Pulse: 81  Resp: 16  Temp: 98.3 F (36.8 C)  TempSrc: Temporal  SpO2: 98%  Weight: 278 lb 12.8 oz (126.5 kg)  Height: 5\' 10"  (1.778 m)     Physical Exam Vitals reviewed.  Constitutional:      Appearance: He is well-developed. He is obese.  HENT:     Head: Normocephalic and atraumatic.  Neck:     Vascular: No carotid bruit or JVD.  Cardiovascular:     Rate and Rhythm: Normal rate and regular rhythm.     Heart sounds: Normal heart sounds. No murmur heard. Pulmonary:     Effort: Pulmonary effort is normal.     Breath sounds: Normal breath sounds. No rales.  Musculoskeletal:     Right lower leg: No edema.     Left lower leg: No edema.  Skin:    General: Skin is warm and dry.  Neurological:     Mental Status: He is alert and oriented to person, place, and time.  Psychiatric:        Mood and Affect: Mood normal.       Assessment & Plan:  Bradley C Weitz is a 40 y.o. male . Essential hypertension - Plan: Comprehensive metabolic panel, lisinopril (ZESTRIL) 10 MG tablet  -Stable, continue lisinopril.  Check labs  BMI 40.0-44.9, adult ([place])  -Dietary advice given.  Initial goal of protein shake/cold protein drink in place of one of his sodas during the day.  Cutting back on soda and increased water discussed.   Hyperlipidemia, unspecified hyperlipidemia type - Plan: Comprehensive metabolic panel, Lipid panel  -Check labs, no current meds.  Dietary modification as above.  Cigarette nicotine dependence without complication  -Precontemplative stage, advised to let me know when he is ready to quit, briefly discussed health risks.  Understanding  expressed.  Meds ordered this encounter  Medications   lisinopril (ZESTRIL) 10 MG tablet    Sig: Take 1 tablet (10 mg total) by mouth daily.    Dispense:  90 tablet    Refill:  2    Refills on file if needed.   Patient Instructions  Try to cut back on soda, water is best - especially with the hot weather. Try replacing soda with a protein drink during the morning  or lunch if you are not feeling like eating a meal. Goal of 1 replacement per day initially. Let me know if I can help further. When you are ready to quit smoking, let me know and I am happy to help if needed. No med changes today.  I will check some lab work, follow-up in 6 months.  Let me know if there are questions sooner.  Thanks for coming in today and take care.      Signed,   Merri Ray, MD [facility] 08/07/20 9:34 AM

## 2020-08-07 NOTE — Patient Instructions (Addendum)
Try to cut back on soda, water is best - especially with the hot weather. Try replacing soda with a protein drink during the morning or lunch if you are not feeling like eating a meal. Goal of 1 replacement per day initially. Let me know if I can help further. When you are ready to quit smoking, let me know and I am happy to help if needed. No med changes today.  I will check some lab work, follow-up in 6 months.  Let me know if there are questions sooner.  Thanks for coming in today and take care.

## 2021-02-13 ENCOUNTER — Encounter: Payer: Self-pay | Admitting: Family Medicine

## 2021-02-13 ENCOUNTER — Ambulatory Visit (INDEPENDENT_AMBULATORY_CARE_PROVIDER_SITE_OTHER): Payer: 59 | Admitting: Family Medicine

## 2021-02-13 VITALS — BP 122/74 | HR 82 | Temp 98.3°F | Resp 16 | Ht 70.0 in | Wt 284.6 lb

## 2021-02-13 DIAGNOSIS — Z0000 Encounter for general adult medical examination without abnormal findings: Secondary | ICD-10-CM

## 2021-02-13 DIAGNOSIS — E785 Hyperlipidemia, unspecified: Secondary | ICD-10-CM | POA: Diagnosis not present

## 2021-02-13 DIAGNOSIS — I10 Essential (primary) hypertension: Secondary | ICD-10-CM

## 2021-02-13 DIAGNOSIS — Z131 Encounter for screening for diabetes mellitus: Secondary | ICD-10-CM

## 2021-02-13 DIAGNOSIS — Z23 Encounter for immunization: Secondary | ICD-10-CM | POA: Diagnosis not present

## 2021-02-13 DIAGNOSIS — Z130 Encounter for screening for diseases of the blood and blood-forming organs and certain disorders involving the immune mechanism: Secondary | ICD-10-CM | POA: Diagnosis not present

## 2021-02-13 DIAGNOSIS — Z6841 Body Mass Index (BMI) 40.0 and over, adult: Secondary | ICD-10-CM

## 2021-02-13 DIAGNOSIS — F17210 Nicotine dependence, cigarettes, uncomplicated: Secondary | ICD-10-CM

## 2021-02-13 LAB — LIPID PANEL
Cholesterol: 170 mg/dL (ref 0–200)
HDL: 29.6 mg/dL — ABNORMAL LOW (ref >39.00)
LDL Cholesterol: 114 mg/dL — ABNORMAL HIGH (ref 0–99)
NonHDL: 140.02
Total CHOL/HDL Ratio: 6
Triglycerides: 128 mg/dL (ref 0.0–149.0)
VLDL: 25.6 mg/dL (ref 0.0–40.0)

## 2021-02-13 LAB — COMPREHENSIVE METABOLIC PANEL
ALT: 21 U/L (ref 0–53)
AST: 18 U/L (ref 0–37)
Albumin: 4.5 g/dL (ref 3.5–5.2)
Alkaline Phosphatase: 71 U/L (ref 39–117)
BUN: 12 mg/dL (ref 6–23)
CO2: 29 mEq/L (ref 19–32)
Calcium: 9.4 mg/dL (ref 8.4–10.5)
Chloride: 104 mEq/L (ref 96–112)
Creatinine, Ser: 1.09 mg/dL (ref 0.40–1.50)
GFR: 86.02 mL/min (ref >60.00)
Glucose, Bld: 83 mg/dL (ref 70–99)
Potassium: 4.9 mEq/L (ref 3.5–5.1)
Sodium: 140 mEq/L (ref 135–145)
Total Bilirubin: 0.9 mg/dL (ref 0.2–1.2)
Total Protein: 7 g/dL (ref 6.0–8.3)

## 2021-02-13 LAB — CBC WITH DIFFERENTIAL/PLATELET
Basophils Absolute: 0 10*3/uL (ref 0.0–0.1)
Basophils Relative: 0.4 % (ref 0.0–3.0)
Eosinophils Absolute: 0.1 10*3/uL (ref 0.0–0.7)
Eosinophils Relative: 1.1 % (ref 0.0–5.0)
HCT: 48.7 % (ref 39.0–52.0)
Hemoglobin: 16.1 g/dL (ref 13.0–17.0)
Lymphocytes Relative: 29.3 % (ref 12.0–46.0)
Lymphs Abs: 2 10*3/uL (ref 0.7–4.0)
MCHC: 33 g/dL (ref 30.0–36.0)
MCV: 86 fl (ref 78.0–100.0)
Monocytes Absolute: 0.7 10*3/uL (ref 0.1–1.0)
Monocytes Relative: 10.2 % (ref 3.0–12.0)
Neutro Abs: 4.1 10*3/uL (ref 1.4–7.7)
Neutrophils Relative %: 59 % (ref 43.0–77.0)
Platelets: 199 10*3/uL (ref 150.0–400.0)
RBC: 5.67 Mil/uL (ref 4.22–5.81)
RDW: 13.4 % (ref 11.5–15.5)
WBC: 7 10*3/uL (ref 4.0–10.5)

## 2021-02-13 LAB — HEMOGLOBIN A1C: Hgb A1c MFr Bld: 5.6 % (ref 4.6–6.5)

## 2021-02-13 MED ORDER — LISINOPRIL 10 MG PO TABS: 10.0000 mg | ORAL_TABLET | Freq: Every day | ORAL | 2 refills | Status: DC

## 2021-02-13 NOTE — Progress Notes (Signed)
Subjective:  Patient ID: Bradley Morales, male    DOB: 26-Dec-1980  Age: 41 y.o. MRN: [id number]  CC:  Chief Complaint  Patient presents with   Annual Exam    Pt here for annual exam no concerns, feeling well, wants flu shot today     HPI Bradley C Trella presents for   Annual physical exam  Hypertension: Lisinopril 10 mg daily, no new side effects.  Home readings:none.  BP Readings from Last 3 Encounters:  02/13/21 122/74  08/07/20 126/74  01/31/20 132/86   Lab Results  Component Value Date   CREATININE 1.04 08/07/2020   Obstructive sleep apnea AHI 9/2 in 2015 - did not tolerate CPAP, advised to sleep on side-  avoid supine sleep position. Feels rested during day, no nighttime wakening.   Hyperlipidemia: With obesity.  No family history of early CAD/CVD.  Lifestyle modifications discussed previously with meal planning, consistent meals, food choices.  Active with work.  Weight has increased since July- usually gains weight during winter.  Active job, no other exercise.  Fast food 2 days per week.  Eats dinner then to bed - largest meal.  Soda 1-2 per day. Sweet tea on occasion. Alcohol - rare.   The ASCVD Risk score (Arnett DK, et al., 2019) failed to calculate for the following reasons:   The 2019 ASCVD risk score is only valid for ages 81 to 18  Wt Readings from Last 3 Encounters:  02/13/21 284 lb 9.6 oz (129.1 kg)  08/07/20 278 lb 12.8 oz (126.5 kg)  01/31/20 278 lb (126.1 kg)   Lab Results  Component Value Date   CHOL 168 08/07/2020   HDL 31.50 (L) 08/07/2020   LDLCALC 119 (H) 08/07/2020   TRIG 90.0 08/07/2020   CHOLHDL 5 08/07/2020   Lab Results  Component Value Date   ALT 20 08/07/2020   AST 18 08/07/2020   ALKPHOS 71 08/07/2020   BILITOT 0.8 08/07/2020   Nicotine addiction Cigarettes - 1 pack per day, 18 pack years. Quit for 2 years, then restarted.  Not ready to quit at this time, but considering.  E-cigs helped prior.   Fh of uterine CA -  mom  Immunization History  Administered Date(s) Administered   Influenza Split 11/03/2011, 09/05/2012   Influenza,inj,Quad PF,6+ Mos 12/20/2018, 02/13/2021   Influenza-Unspecified 11/07/2013, 11/14/2014, 12/05/2019   Moderna Sars-Covid-2 Vaccination 04/26/2019, 05/24/2019, 12/25/2019   Td 01/31/2020   Tdap 01/18/2010  Flu vaccine today.  Single covid booster - discussed bivalent booster.   Vision Screening   Right eye Left eye Both eyes  Without correction 20/25 20/20 20/15   With correction     Due for yearly eye visit - has glasses if needed.   Dentist last week.   Alcohol - rare.   Tobacco - as above.   Exercise - as above.      History Patient Active Problem List   Diagnosis Date Noted   Obesity due to excess calories    Snoring disorder    HTN (hypertension) 08/30/2012   Palpitations 04/06/2012   OSA (obstructive sleep apnea) 03/06/2012   Nicotine addiction 11/05/2011   BMI 45.0-49.9, adult ([place]) 11/05/2011   Past Medical History:  Diagnosis Date   Bell's palsy    Hypertension    Obesity due to excess calories    Snoring disorder    Past Surgical History:  Procedure Laterality Date   CYST EXCISION N/A 09/30/2017   Procedure: EXCISION OF POSTERIOR NECK CYST AND LOWER BACK  CYST;  Surgeon: Ralene Ok, MD;  Location: [facility];  Service: General;  Laterality: N/A;   VASECTOMY     WISDOM TOOTH EXTRACTION     No Known Allergies Prior to Admission medications   Medication Sig Start Date End Date Taking? Authorizing Provider  lisinopril (ZESTRIL) 10 MG tablet Take 1 tablet (10 mg total) by mouth daily. 08/07/20  Yes [name], MD   Social History   Socioeconomic History   Marital status: Married    Spouse name: Melissa   Number of children: 1   Years of education: 12   Highest education level: Not on file  Occupational History   Occupation: Music therapist: [employer]   Occupation: Music therapist: [employer]   Tobacco Use   Smoking status: Every Day    Packs/day: 1.00    Years: 10.00    Pack years: 10.00    Types: Cigarettes   Smokeless tobacco: Never  Vaping Use   Vaping Use: Former  Substance and Sexual Activity   Alcohol use: Yes    Comment: rarely   Drug use: No   Sexual activity: Yes    Birth control/protection: None    Comment: 1 partner in last 12 months  Other Topics Concern   Not on file  Social History Narrative   Patient is married ([place]) and lives at home with his family.   Patient has one child.   Patient is working full-time.   Patient has a high school education.   Patient is right-handed.   Patient drinks 3 cups of soda per day.   Social Determinants of Health   Financial Resource Strain: Not on file  Food Insecurity: Not on file  Transportation Needs: Not on file  Physical Activity: Not on file  Stress: Not on file  Social Connections: Not on file  Intimate Partner Violence: Not on file    Review of Systems 13 point review of systems per patient health survey noted.  Negative other than as indicated above or in HPI.    Objective:   Vitals:   02/13/21 0755  BP: 122/74  Pulse: 82  Resp: 16  Temp: 98.3 F (36.8 C)  TempSrc: Temporal  SpO2: 100%  Weight: 284 lb 9.6 oz (129.1 kg)  Height: 5\' 10"  (1.778 m)     Physical Exam Vitals reviewed.  Constitutional:      Appearance: He is well-developed.  HENT:     Head: Normocephalic and atraumatic.     Right Ear: External ear normal.     Left Ear: External ear normal.  Eyes:     Conjunctiva/sclera: Conjunctivae normal.     Pupils: Pupils are equal, round, and reactive to light.  Neck:     Thyroid: No thyromegaly.  Cardiovascular:     Rate and Rhythm: Normal rate and regular rhythm.     Heart sounds: Normal heart sounds.  Pulmonary:     Effort: Pulmonary effort is normal. No respiratory distress.     Breath sounds: Normal breath sounds. No wheezing.  Abdominal:     General: There is no  distension.     Palpations: Abdomen is soft.     Tenderness: There is no abdominal tenderness.  Musculoskeletal:        General: No tenderness. Normal range of motion.     Cervical back: Normal range of motion and neck supple.  Lymphadenopathy:     Cervical: No cervical adenopathy.  Skin:  General: Skin is warm and dry.  Neurological:     Mental Status: He is alert and oriented to person, place, and time.     Deep Tendon Reflexes: Reflexes are normal and symmetric.  Psychiatric:        Behavior: Behavior normal.       Assessment & Plan:  Bradley C Desautel is a 41 y.o. male . Annual physical exam  - -anticipatory guidance as below in AVS, screening labs above. Health maintenance items as above in HPI discussed/recommended as applicable.   Essential hypertension - Plan: Comprehensive metabolic panel, lisinopril (ZESTRIL) 10 MG tablet  -  Stable, tolerating current regimen. Medications refilled. Labs pending as above.   Screening for diabetes mellitus (DM) - Plan: Hemoglobin A1c  Hyperlipidemia, unspecified hyperlipidemia type - Plan: Lipid panel  Screening for deficiency anemia - Plan: CBC with Differential/Platelet  Need for influenza vaccination - Plan: Flu Vaccine QUAD 6+ mos PF IM (Fluarix Quad PF)  BMI 40.0-44.9, adult ([place])  -Nutrition recommendations discussed with portion control, timing of meals, and avoidance of sugar containing beverages.  Monitoring level of activity/exercise including on weekends if possible.  Recheck 6 months.  Discussed options with medical or surgical weight loss specialist or nutritionist, deferred at this time.  Cigarette nicotine dependence without complication  -Contemplating cessation, success with e-cigarettes previously.  Advised to let me know if I can assist when he is ready to quit, and various prescription treatments discussed.  Handout given. 3 minute discussion.   Meds ordered this encounter  Medications   lisinopril (ZESTRIL) 10 MG  tablet    Sig: Take 1 tablet (10 mg total) by mouth daily.    Dispense:  90 tablet    Refill:  2    Refills on file if needed.   Patient Instructions  Watch portions, try to cut back on sugar beverages, and exercise goal of 150 minutes per week. Continue to stay active. Let me know if you would like to meet with a nutritionist, or weight specialist.   Blood pressure looks ok, no med changes.   Let me know if I can help with quitting smoking when you are ready.   If any concerns on labs I will let you know. Thanks for coming in today.   Steps to Quit Smoking Smoking tobacco is the leading cause of preventable death. It can affect almost every organ in the body. Smoking puts you and those around you at risk for developing many serious chronic diseases. Quitting smoking can be difficult, but it is one of the best things that you can do for your health. It is never too late to quit. How do I get ready to quit? When you decide to quit smoking, create a plan to help you succeed. Before you quit: Pick a date to quit. Set a date within the next 2 weeks to give you time to prepare. Write down the reasons why you are quitting. Keep this list in places where you will see it often. Tell your family, friends, and co-workers that you are quitting. Support from your loved ones can make quitting easier. Talk with your health care provider about your options for quitting smoking. Find out what treatment options are covered by your health insurance. Identify people, places, things, and activities that make you want to smoke (triggers). Avoid them. What first steps can I take to quit smoking? Throw away all cigarettes at home, at work, and in your car. Throw away smoking accessories, such as  Scientist, research (medical). Clean your car. Make sure to empty the ashtray. Clean your home, including curtains and carpets. What strategies can I use to quit smoking? Talk with your health care provider about combining  strategies, such as taking medicines while you are also receiving in-person counseling. Using these two strategies together makes you more likely to succeed in quitting than if you used either strategy on its own. If you are pregnant or breastfeeding, talk with your health care provider about finding counseling or other support strategies to quit smoking. Do not take medicine to help you quit smoking unless your health care provider tells you to do so. To quit smoking: Quit right away Quit smoking completely, instead of gradually reducing how much you smoke over a period of time. Research shows that stopping smoking right away is more successful than gradually quitting. Attend in-person counseling to help you build problem-solving skills. You are more likely to succeed in quitting if you attend counseling sessions regularly. Even short sessions of 10 minutes can be effective. Take medicine You may take medicines to help you quit smoking. Some medicines require a prescription and some you can purchase over-the-counter. Medicines may have nicotine in them to replace the nicotine in cigarettes. Medicines may: Help to stop cravings. Help to relieve withdrawal symptoms. Your health care provider may recommend: Nicotine patches, gum, or lozenges. Nicotine inhalers or sprays. Non-nicotine medicine that is taken by mouth. Find resources Find resources and support systems that can help you to quit smoking and remain smoke-free after you quit. These resources are most helpful when you use them often. They include: Online chats with a Social worker. Telephone quitlines. Printed Furniture conservator/restorer. Support groups or group counseling. Text messaging programs. Mobile phone apps or applications. Use apps that can help you stick to your quit plan by providing reminders, tips, and encouragement. There are many free apps for mobile devices as well as websites. Examples include Quit Guide from the [employer] and  smokefree.gov What things can I do to make it easier to quit?  Reach out to your family and friends for support and encouragement. Call telephone quitlines (1-800-QUIT-NOW), reach out to support groups, or work with a counselor for support. Ask people who smoke to avoid smoking around you. Avoid places that trigger you to smoke, such as bars, parties, or smoke-break areas at work. Spend time with people who do not smoke. Lessen the stress in your life. Stress can be a smoking trigger for some people. To lessen stress, try: Exercising regularly. Doing deep-breathing exercises. Doing yoga. Meditating. Performing a body scan. This involves closing your eyes, scanning your body from head to toe, and noticing which parts of your body are particularly tense. Try to relax the muscles in those areas. How will I feel when I quit smoking? Day 1 to 3 weeks Within the first 24 hours of quitting smoking, you may start to feel withdrawal symptoms. These symptoms are usually most noticeable 2-3 days after quitting, but they usually do not last for more than 2-3 weeks. You may experience these symptoms: Mood swings. Restlessness, anxiety, or irritability. Trouble concentrating. Dizziness. Strong cravings for sugary foods and nicotine. Mild weight gain. Constipation. Nausea. Coughing or a sore throat. Changes in how the medicines that you take for unrelated issues work in your body. Depression. Trouble sleeping (insomnia). Week 3 and afterward After the first 2-3 weeks of quitting, you may start to notice more positive results, such as: Improved sense of smell and taste. Decreased coughing  and sore throat. Slower heart rate. Lower blood pressure. Clearer skin. The ability to breathe more easily. Fewer sick days. Quitting smoking can be very challenging. Do not get discouraged if you are not successful the first time. Some people need to make many attempts to quit before they achieve long-term  success. Do your best to stick to your quit plan, and talk with your health care provider if you have any questions or concerns. Summary Smoking tobacco is the leading cause of preventable death. Quitting smoking is one of the best things that you can do for your health. When you decide to quit smoking, create a plan to help you succeed. Quit smoking right away, not slowly over a period of time. When you start quitting, seek help from your health care provider, family, or friends. This information is not intended to replace advice given to you by your health care provider. Make sure you discuss any questions you have with your health care provider. Document Revised: 09/12/2020 Document Reviewed: 03/25/2018 Elsevier Patient Education  2022 [place],   Merri Ray, MD [place] 02/13/21 8:44 AM

## 2021-02-13 NOTE — Patient Instructions (Addendum)
Watch portions, try to cut back on sugar beverages, and exercise goal of 150 minutes per week. Continue to stay active. Let me know if you would like to meet with a nutritionist, or weight specialist.   Blood pressure looks ok, no med changes.   Let me know if I can help with quitting smoking when you are ready.   If any concerns on labs I will let you know. Thanks for coming in today.   Steps to Quit Smoking Smoking tobacco is the leading cause of preventable death. It can affect almost every organ in the body. Smoking puts you and those around you at risk for developing many serious chronic diseases. Quitting smoking can be difficult, but it is one of the best things that you can do for your health. It is never too late to quit. How do I get ready to quit? When you decide to quit smoking, create a plan to help you succeed. Before you quit: Pick a date to quit. Set a date within the next 2 weeks to give you time to prepare. Write down the reasons why you are quitting. Keep this list in places where you will see it often. Tell your family, friends, and co-workers that you are quitting. Support from your loved ones can make quitting easier. Talk with your health care provider about your options for quitting smoking. Find out what treatment options are covered by your health insurance. Identify people, places, things, and activities that make you want to smoke (triggers). Avoid them. What first steps can I take to quit smoking? Throw away all cigarettes at home, at work, and in your car. Throw away smoking accessories, such as Scientist, research (medical). Clean your car. Make sure to empty the ashtray. Clean your home, including curtains and carpets. What strategies can I use to quit smoking? Talk with your health care provider about combining strategies, such as taking medicines while you are also receiving in-person counseling. Using these two strategies together makes you more likely to succeed in  quitting than if you used either strategy on its own. If you are pregnant or breastfeeding, talk with your health care provider about finding counseling or other support strategies to quit smoking. Do not take medicine to help you quit smoking unless your health care provider tells you to do so. To quit smoking: Quit right away Quit smoking completely, instead of gradually reducing how much you smoke over a period of time. Research shows that stopping smoking right away is more successful than gradually quitting. Attend in-person counseling to help you build problem-solving skills. You are more likely to succeed in quitting if you attend counseling sessions regularly. Even short sessions of 10 minutes can be effective. Take medicine You may take medicines to help you quit smoking. Some medicines require a prescription and some you can purchase over-the-counter. Medicines may have nicotine in them to replace the nicotine in cigarettes. Medicines may: Help to stop cravings. Help to relieve withdrawal symptoms. Your health care provider may recommend: Nicotine patches, gum, or lozenges. Nicotine inhalers or sprays. Non-nicotine medicine that is taken by mouth. Find resources Find resources and support systems that can help you to quit smoking and remain smoke-free after you quit. These resources are most helpful when you use them often. They include: Online chats with a Social worker. Telephone quitlines. Printed Furniture conservator/restorer. Support groups or group counseling. Text messaging programs. Mobile phone apps or applications. Use apps that can help you stick to your quit plan  by providing reminders, tips, and encouragement. There are many free apps for mobile devices as well as websites. Examples include Quit Guide from the [employer] and smokefree.gov What things can I do to make it easier to quit?  Reach out to your family and friends for support and encouragement. Call telephone quitlines  (1-800-QUIT-NOW), reach out to support groups, or work with a counselor for support. Ask people who smoke to avoid smoking around you. Avoid places that trigger you to smoke, such as bars, parties, or smoke-break areas at work. Spend time with people who do not smoke. Lessen the stress in your life. Stress can be a smoking trigger for some people. To lessen stress, try: Exercising regularly. Doing deep-breathing exercises. Doing yoga. Meditating. Performing a body scan. This involves closing your eyes, scanning your body from head to toe, and noticing which parts of your body are particularly tense. Try to relax the muscles in those areas. How will I feel when I quit smoking? Day 1 to 3 weeks Within the first 24 hours of quitting smoking, you may start to feel withdrawal symptoms. These symptoms are usually most noticeable 2-3 days after quitting, but they usually do not last for more than 2-3 weeks. You may experience these symptoms: Mood swings. Restlessness, anxiety, or irritability. Trouble concentrating. Dizziness. Strong cravings for sugary foods and nicotine. Mild weight gain. Constipation. Nausea. Coughing or a sore throat. Changes in how the medicines that you take for unrelated issues work in your body. Depression. Trouble sleeping (insomnia). Week 3 and afterward After the first 2-3 weeks of quitting, you may start to notice more positive results, such as: Improved sense of smell and taste. Decreased coughing and sore throat. Slower heart rate. Lower blood pressure. Clearer skin. The ability to breathe more easily. Fewer sick days. Quitting smoking can be very challenging. Do not get discouraged if you are not successful the first time. Some people need to make many attempts to quit before they achieve long-term success. Do your best to stick to your quit plan, and talk with your health care provider if you have any questions or concerns. Summary Smoking tobacco is the  leading cause of preventable death. Quitting smoking is one of the best things that you can do for your health. When you decide to quit smoking, create a plan to help you succeed. Quit smoking right away, not slowly over a period of time. When you start quitting, seek help from your health care provider, family, or friends. This information is not intended to replace advice given to you by your health care provider. Make sure you discuss any questions you have with your health care provider. Document Revised: 09/12/2020 Document Reviewed: 03/25/2018 Elsevier Patient Education  [place].

## 2021-08-20 ENCOUNTER — Ambulatory Visit: Payer: 59 | Admitting: Family Medicine

## 2021-08-20 ENCOUNTER — Encounter: Payer: Self-pay | Admitting: Family Medicine

## 2021-08-20 VITALS — BP 122/70 | HR 73 | Temp 98.0°F | Resp 16 | Ht 70.0 in | Wt 277.6 lb

## 2021-08-20 DIAGNOSIS — I10 Essential (primary) hypertension: Secondary | ICD-10-CM | POA: Diagnosis not present

## 2021-08-20 DIAGNOSIS — E785 Hyperlipidemia, unspecified: Secondary | ICD-10-CM

## 2021-08-20 DIAGNOSIS — Z1159 Encounter for screening for other viral diseases: Secondary | ICD-10-CM

## 2021-08-20 DIAGNOSIS — F17210 Nicotine dependence, cigarettes, uncomplicated: Secondary | ICD-10-CM

## 2021-08-20 LAB — COMPREHENSIVE METABOLIC PANEL
ALT: 18 U/L (ref 0–53)
AST: 18 U/L (ref 0–37)
Albumin: 4.4 g/dL (ref 3.5–5.2)
Alkaline Phosphatase: 65 U/L (ref 39–117)
BUN: 12 mg/dL (ref 6–23)
CO2: 27 mEq/L (ref 19–32)
Calcium: 9.7 mg/dL (ref 8.4–10.5)
Chloride: 103 mEq/L (ref 96–112)
Creatinine, Ser: 1.09 mg/dL (ref 0.40–1.50)
GFR: 85.71 mL/min (ref >60.00)
Glucose, Bld: 80 mg/dL (ref 70–99)
Potassium: 4.9 mEq/L (ref 3.5–5.1)
Sodium: 136 mEq/L (ref 135–145)
Total Bilirubin: 0.6 mg/dL (ref 0.2–1.2)
Total Protein: 7.4 g/dL (ref 6.0–8.3)

## 2021-08-20 LAB — LIPID PANEL
Cholesterol: 174 mg/dL (ref 0–200)
HDL: 32 mg/dL — ABNORMAL LOW (ref >39.00)
LDL Cholesterol: 121 mg/dL — ABNORMAL HIGH (ref 0–99)
NonHDL: 141.72
Total CHOL/HDL Ratio: 5
Triglycerides: 105 mg/dL (ref 0.0–149.0)
VLDL: 21 mg/dL (ref 0.0–40.0)

## 2021-08-20 MED ORDER — LISINOPRIL 10 MG PO TABS: 10.0000 mg | ORAL_TABLET | Freq: Every day | ORAL | 2 refills | Status: DC

## 2021-08-20 NOTE — Progress Notes (Signed)
Subjective:  Patient ID: Bradley Morales, male    DOB: Aug 14, 1980  Age: 41 y.o. MRN: [id number]  CC:  Chief Complaint  Patient presents with   Hypertension   Hyperlipidemia    Pt is fasting     HPI Bradley C Dorris presents for   Hypertension: Lisinopril 10 mg daily.  History of obstructive sleep apnea, did not tolerate CPAP, advised to sleep on side/avoid supine sleeping position.  Feels rested during the day with no nighttime wakening. No cough or other new side effects with lisinopril.  Home readings: 120/70 range. 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  Associated with obesity.  No family history of early CAD/CVD.  Physical activity with work.  Weight has improved 7 pounds since last visit. Lab Results  Component Value Date   CHOL 170 02/13/2021   HDL 29.60 (L) 02/13/2021   LDLCALC 114 (H) 02/13/2021   TRIG 128.0 02/13/2021   CHOLHDL 6 02/13/2021   Lab Results  Component Value Date   ALT 21 02/13/2021   AST 18 02/13/2021   ALKPHOS 71 02/13/2021   BILITOT 0.9 02/13/2021  Body mass index is 39.83 kg/m. Wt Readings from Last 3 Encounters:  08/20/21 277 lb 9.6 oz (125.9 kg)  02/13/21 284 lb 9.6 oz (129.1 kg)  08/07/20 278 lb 12.8 oz (126.5 kg)  Fasting today.   Nicotine addiction 1 pack/day, discussed in January.  Previous quit for 2 years then restarted. No recent quit attempts, plans to change to vaping in few weeks - helped transition off cigarettes in past.   HM: Recommended updated covid booster.  Hep C ab today.   History Patient Active Problem List   Diagnosis Date Noted   Obesity due to excess calories    Snoring disorder    HTN (hypertension) 08/30/2012   Palpitations 04/06/2012   OSA (obstructive sleep apnea) 03/06/2012   Nicotine  addiction 11/05/2011   BMI 45.0-49.9, adult ([place] [facility]) 11/05/2011   Past Medical History:  Diagnosis Date   Bell's palsy    Hypertension    Obesity due to excess calories    Snoring disorder    Past Surgical History:  Procedure Laterality Date   CYST EXCISION N/A 09/30/2017   Procedure: EXCISION OF POSTERIOR NECK CYST AND LOWER BACK CYST;  Surgeon: Ralene Ok, MD;  Location: [facility];  Service: General;  Laterality: N/A;   VASECTOMY     WISDOM TOOTH EXTRACTION     No Known Allergies Prior to Admission medications   Medication Sig Start Date End Date Taking? Authorizing Provider  lisinopril (ZESTRIL) 10 MG tablet Take 1 tablet (10 mg total) by mouth daily. 02/13/21  Yes [name], MD   Social History   Socioeconomic History   Marital status: Married    Spouse name: Melissa   Number of children: 1   Years of education: 12   Highest education level: Not on file  Occupational History   Occupation: Music therapist: [employer]   Occupation: Music therapist: [employer]  Tobacco Use   Smoking status: Every Day    Packs/day: 1.00    Years: 10.00    Total pack years: 10.00    Types: Cigarettes   Smokeless tobacco: Never  Vaping Use  Vaping Use: Former  Substance and Sexual Activity   Alcohol use: Yes    Comment: rarely   Drug use: No   Sexual activity: Yes    Birth control/protection: None    Comment: 1 partner in last 12 months  Other Topics Concern   Not on file  Social History Narrative   Patient is married Programme researcher, broadcasting/film/video) and lives at home with his family.   Patient has one child.   Patient is working full-time.   Patient has a high school education.   Patient is right-handed.   Patient drinks 3 cups of soda per day.   Social Determinants of Health   Financial Resource Strain: Not on file  Food Insecurity: Not on file  Transportation Needs: Not on file  Physical Activity: Not on file  Stress: Not on file  Social Connections:  Not on file  Intimate Partner Violence: Not on file    Review of Systems  Constitutional:  Negative for fatigue and unexpected weight change.  Eyes:  Negative for visual disturbance.  Respiratory:  Negative for cough, chest tightness and shortness of breath.   Cardiovascular:  Negative for chest pain, palpitations and leg swelling.  Gastrointestinal:  Negative for abdominal pain and blood in stool.  Neurological:  Negative for dizziness, light-headedness and headaches.     Objective:   Vitals:   08/20/21 0835  BP: 122/70  Pulse: 73  Resp: 16  Temp: 98 F (36.7 C)  TempSrc: Oral  SpO2: 99%  Weight: 277 lb 9.6 oz (125.9 kg)  Height: '5\' 10"'$  (1.778 m)     Physical Exam Vitals reviewed.  Constitutional:      Appearance: He is well-developed.  HENT:     Head: Normocephalic and atraumatic.  Neck:     Vascular: No carotid bruit or JVD.  Cardiovascular:     Rate and Rhythm: Normal rate and regular rhythm.     Heart sounds: Normal heart sounds. No murmur heard. Pulmonary:     Effort: Pulmonary effort is normal.     Breath sounds: Normal breath sounds. No rales.  Musculoskeletal:     Right lower leg: No edema.     Left lower leg: No edema.  Skin:    General: Skin is warm and dry.  Neurological:     Mental Status: He is alert and oriented to person, place, and time.  Psychiatric:        Mood and Affect: Mood normal.        Assessment & Plan:  Bradley C Lucking is a 41 y.o. male . Hyperlipidemia, unspecified hyperlipidemia type  -Check labs, continue weight loss, diet approach.  Essential hypertension  -Stable on lisinopril, continue same dose, check labs.  Need for hepatitis C screening test Check hep C antibody  Plans on vaping as transition to smoking cessation.  Advised to let me know if we can help further.  Handout given.  Recheck 6 months for physical  Meds ordered this encounter  Medications   lisinopril (ZESTRIL) 10 MG tablet    Sig: Take 1 tablet (10  mg total) by mouth daily.    Dispense:  90 tablet    Refill:  2    Refills on file if needed.   Patient Instructions  No med changes. Let me know if we can help with quitting smoking.   Steps to Quit Smoking Smoking tobacco is the leading cause of preventable death. It can affect almost every organ in the body. Smoking puts you and  those around you at risk for developing many serious chronic diseases. Quitting smoking can be very challenging. Do not get discouraged if you are not successful the first time. Some people need to make many attempts to quit before they achieve long-term success. Do your best to stick to your quit plan, and talk with your health care provider if you have any questions or concerns. How do I get ready to quit? When you decide to quit smoking, create a plan to help you succeed. Before you quit: Pick a date to quit. Set a date within the next 2 weeks to give you time to prepare. Write down the reasons why you are quitting. Keep this list in places where you will see it often. Tell your family, friends, and co-workers that you are quitting. Support from people you are close to can make quitting easier. Talk with your health care provider about your options for quitting smoking. Find out what treatment options are covered by your health insurance. Identify people, places, things, and activities that make you want to smoke (triggers). Avoid them. What first steps can I take to quit smoking? Throw away all cigarettes at home, at work, and in your car. Throw away smoking accessories, such as Scientist, research (medical). Clean your car. Make sure to empty the ashtray. Clean your home, including curtains and carpets. What strategies can I use to quit smoking? Talk with your health care provider about combining strategies, such as taking medicines while you are also receiving in-person counseling. Using these two strategies together makes you more likely to succeed in quitting than  if you used either strategy on its own. If you are pregnant or breastfeeding, talk with your health care provider about finding counseling or other support strategies to quit smoking. Do not take medicine to help you quit smoking unless your health care provider tells you to. Quit right away Quit smoking completely, instead of gradually reducing how much you smoke over a period of time. Stopping smoking right away may be more successful than gradually quitting. Attend in-person counseling to help you build problem-solving skills. You are more likely to succeed in quitting if you attend counseling sessions regularly. Even short sessions of 10 minutes can be effective. Take medicine You may take medicines to help you quit smoking. Some medicines require a prescription. You can also purchase over-the-counter medicines. Medicines may have nicotine in them to replace the nicotine in cigarettes. Medicines may: Help to stop cravings. Help to relieve withdrawal symptoms. Your health care provider may recommend: Nicotine patches, gum, or lozenges. Nicotine inhalers or sprays. Non-nicotine medicine that you take by mouth. Find resources Find resources and support systems that can help you quit smoking and remain smoke-free after you quit. These resources are most helpful when you use them often. They include: Online chats with a Social worker. Telephone quitlines. Printed Furniture conservator/restorer. Support groups or group counseling. Text messaging programs. Mobile phone apps or applications. Use apps that can help you stick to your quit plan by providing reminders, tips, and encouragement. Examples of free services include Quit Guide from the CDC and smokefree.gov  What can I do to make it easier to quit?  Reach out to your family and friends for support and encouragement. Call telephone quitlines, such as 1-800-QUIT-NOW, reach out to support groups, or work with a counselor for support. Ask people who smoke  to avoid smoking around you. Avoid places that trigger you to smoke, such as bars, parties, or smoke-break areas at  work. Spend time with people who do not smoke. Lessen the stress in your life. Stress can be a smoking trigger for some people. To lessen stress, try: Exercising regularly. Doing deep-breathing exercises. Doing yoga. Meditating. What benefits will I see if I quit smoking? Over time, you should start to see positive results, such as: Improved sense of smell and taste. Decreased coughing and sore throat. Slower heart rate. Lower blood pressure. Clearer and healthier skin. The ability to breathe more easily. Fewer sick days. Summary Quitting smoking can be very challenging. Do not get discouraged if you are not successful the first time. Some people need to make many attempts to quit before they achieve long-term success. When you decide to quit smoking, create a plan to help you succeed. Quit smoking right away, not slowly over a period of time. Find resources and support systems that can help you quit smoking and remain smoke-free after you quit. This information is not intended to replace advice given to you by your health care provider. Make sure you discuss any questions you have with your health care provider. Document Revised: 12/26/2020 Document Reviewed: 12/26/2020 Elsevier Patient Education  2023 [place],   Merri Ray, MD [facility] 08/20/21 9:27 AM

## 2021-08-20 NOTE — Patient Instructions (Signed)
No med changes. Let me know if we can help with quitting smoking.   Steps to Quit Smoking Smoking tobacco is the leading cause of preventable death. It can affect almost every organ in the body. Smoking puts you and those around you at risk for developing many serious chronic diseases. Quitting smoking can be very challenging. Do not get discouraged if you are not successful the first time. Some people need to make many attempts to quit before they achieve long-term success. Do your best to stick to your quit plan, and talk with your health care provider if you have any questions or concerns. How do I get ready to quit? When you decide to quit smoking, create a plan to help you succeed. Before you quit: Pick a date to quit. Set a date within the next 2 weeks to give you time to prepare. Write down the reasons why you are quitting. Keep this list in places where you will see it often. Tell your family, friends, and co-workers that you are quitting. Support from people you are close to can make quitting easier. Talk with your health care provider about your options for quitting smoking. Find out what treatment options are covered by your health insurance. Identify people, places, things, and activities that make you want to smoke (triggers). Avoid them. What first steps can I take to quit smoking? Throw away all cigarettes at home, at work, and in your car. Throw away smoking accessories, such as Scientist, research (medical). Clean your car. Make sure to empty the ashtray. Clean your home, including curtains and carpets. What strategies can I use to quit smoking? Talk with your health care provider about combining strategies, such as taking medicines while you are also receiving in-person counseling. Using these two strategies together makes you more likely to succeed in quitting than if you used either strategy on its own. If you are pregnant or breastfeeding, talk with your health care provider about  finding counseling or other support strategies to quit smoking. Do not take medicine to help you quit smoking unless your health care provider tells you to. Quit right away Quit smoking completely, instead of gradually reducing how much you smoke over a period of time. Stopping smoking right away may be more successful than gradually quitting. Attend in-person counseling to help you build problem-solving skills. You are more likely to succeed in quitting if you attend counseling sessions regularly. Even short sessions of 10 minutes can be effective. Take medicine You may take medicines to help you quit smoking. Some medicines require a prescription. You can also purchase over-the-counter medicines. Medicines may have nicotine in them to replace the nicotine in cigarettes. Medicines may: Help to stop cravings. Help to relieve withdrawal symptoms. Your health care provider may recommend: Nicotine patches, gum, or lozenges. Nicotine inhalers or sprays. Non-nicotine medicine that you take by mouth. Find resources Find resources and support systems that can help you quit smoking and remain smoke-free after you quit. These resources are most helpful when you use them often. They include: Online chats with a Social worker. Telephone quitlines. Printed Furniture conservator/restorer. Support groups or group counseling. Text messaging programs. Mobile phone apps or applications. Use apps that can help you stick to your quit plan by providing reminders, tips, and encouragement. Examples of free services include Quit Guide from the CDC and smokefree.gov  What can I do to make it easier to quit?  Reach out to your family and friends for support and encouragement. Call telephone  quitlines, such as 1-800-QUIT-NOW, reach out to support groups, or work with a Social worker for support. Ask people who smoke to avoid smoking around you. Avoid places that trigger you to smoke, such as bars, parties, or smoke-break areas at  work. Spend time with people who do not smoke. Lessen the stress in your life. Stress can be a smoking trigger for some people. To lessen stress, try: Exercising regularly. Doing deep-breathing exercises. Doing yoga. Meditating. What benefits will I see if I quit smoking? Over time, you should start to see positive results, such as: Improved sense of smell and taste. Decreased coughing and sore throat. Slower heart rate. Lower blood pressure. Clearer and healthier skin. The ability to breathe more easily. Fewer sick days. Summary Quitting smoking can be very challenging. Do not get discouraged if you are not successful the first time. Some people need to make many attempts to quit before they achieve long-term success. When you decide to quit smoking, create a plan to help you succeed. Quit smoking right away, not slowly over a period of time. Find resources and support systems that can help you quit smoking and remain smoke-free after you quit. This information is not intended to replace advice given to you by your health care provider. Make sure you discuss any questions you have with your health care provider. Document Revised: 12/26/2020 Document Reviewed: 12/26/2020 Elsevier Patient Education  [place].

## 2021-08-21 LAB — HEPATITIS C ANTIBODY: Hepatitis C Ab: NONREACTIVE

## 2021-12-24 ENCOUNTER — Ambulatory Visit
Admission: RE | Admit: 2021-12-24 | Discharge: 2021-12-24 | Disposition: A | Discharge status: Home / Self Care | Payer: 59 | Source: Ambulatory Visit | Attending: Emergency Medicine | Admitting: Emergency Medicine

## 2021-12-24 VITALS — BP 131/88 | HR 77 | Temp 98.2°F | Resp 18 | Ht 72.0 in | Wt 289.0 lb

## 2021-12-24 DIAGNOSIS — L0291 Cutaneous abscess, unspecified: Secondary | ICD-10-CM

## 2021-12-24 MED ORDER — DOXYCYCLINE HYCLATE 100 MG PO CAPS: 100.0000 mg | ORAL_CAPSULE | Freq: Two times a day (BID) | ORAL | 0 refills | Status: AC

## 2021-12-24 NOTE — ED Provider Notes (Signed)
Bradley Morales    CSN: [id number] Arrival date & time: 12/24/21  0801      History   Chief Complaint Chief Complaint  Patient presents with   Abscess    HPI Bradley Morales is a 41 y.o. male.  Patient presents with a "boil" on his right antecubital area x 3 days.  The area drained pus spontaneously yesterday but now has redness around it.  He denies fever, chills, numbness, weakness, or other symptoms.  No treatment at home. His medical history includes hypertension.    The history is provided by the patient and medical records.    Past Medical History:  Diagnosis Date   Bell's palsy    Hypertension    Obesity due to excess calories    Snoring disorder     Patient Active Problem List   Diagnosis Date Noted   Obesity due to excess calories    Snoring disorder    HTN (hypertension) 08/30/2012   Palpitations 04/06/2012   OSA (obstructive sleep apnea) 03/06/2012   Nicotine addiction 11/05/2011   BMI 45.0-49.9, adult ([place]) 11/05/2011    Past Surgical History:  Procedure Laterality Date   CYST EXCISION N/A 09/30/2017   Procedure: EXCISION OF POSTERIOR NECK CYST AND LOWER BACK CYST;  Surgeon: Ralene Ok, MD;  Location: [place];  Service: General;  Laterality: N/A;   [place] Medications    Prior to Admission medications   Medication Sig Start Date End Date Taking? Authorizing Provider  doxycycline (VIBRAMYCIN) 100 MG capsule Take 1 capsule (100 mg total) by mouth 2 (two) times daily for 7 days. 12/24/21 12/31/21 Yes Sharion Balloon, NP  lisinopril (ZESTRIL) 10 MG tablet Take 1 tablet (10 mg total) by mouth daily. 08/20/21   [name], MD    Family History Family History  Problem Relation Age of Onset   Cancer Mother    Hypertension Mother    Uterine cancer Mother    Hypertension Father    Diabetes Father     Social History Social History   Tobacco Use   Smoking status: Every Day    Packs/day: 1.00     Years: 10.00    Total pack years: 10.00    Types: Cigarettes   Smokeless tobacco: Never  Vaping Use   Vaping Use: Former  Substance Use Topics   Alcohol use: Yes    Comment: rarely   Drug use: No     Allergies   Patient has no known allergies.   Review of Systems Review of Systems  Constitutional:  Negative for chills and fever.  Musculoskeletal:  Negative for arthralgias and joint swelling.  Skin:  Positive for color change and wound.  Neurological:  Negative for weakness and numbness.  All other systems reviewed and are negative.    Physical Exam Triage Vital Signs ED Triage Vitals  Enc Vitals Group     BP      Pulse      Resp      Temp      Temp src      SpO2      Weight      Height      Head Circumference      Peak Flow      Pain Score      Pain Loc      Pain Edu?      Excl. in [place]?  No data found.  Updated Vital Signs BP 131/88   Pulse 77   Temp 98.2 F (36.8 C)   Resp 18   Ht 6' (1.829 m)   Wt 289 lb (131.1 kg)   SpO2 98%   BMI 39.20 kg/m   Visual Acuity Right Eye Distance:   Left Eye Distance:   Bilateral Distance:    Right Eye Near:   Left Eye Near:    Bilateral Near:     Physical Exam Vitals and nursing note reviewed.  Constitutional:      General: He is not in acute distress.    Appearance: Normal appearance. He is well-developed. He is not ill-appearing.  HENT:     Mouth/Throat:     Mouth: Mucous membranes are moist.  Cardiovascular:     Rate and Rhythm: Normal rate and regular rhythm.  Pulmonary:     Effort: Pulmonary effort is normal. No respiratory distress.  Musculoskeletal:        General: No swelling. Normal range of motion.     Cervical back: Neck supple.  Skin:    General: Skin is warm and dry.     Capillary Refill: Capillary refill takes less than 2 seconds.     Findings: Erythema and lesion present.     Comments: Small open wound on right antecubital space with localized erythema. No drainage.     Neurological:     General: No focal deficit present.     Mental Status: He is alert and oriented to person, place, and time.     Sensory: No sensory deficit.     Motor: No weakness.  Psychiatric:        Mood and Affect: Mood normal.        Behavior: Behavior normal.      UC Treatments / Results  Labs (all labs ordered are listed, but only abnormal results are displayed) Labs Reviewed - No data to display  EKG   Radiology No results found.  Procedures Procedures (including critical care time)  Medications Ordered in UC Medications - No data to display  Initial Impression / Assessment and Plan / UC Course  I have reviewed the triage vital signs and the nursing notes.  Pertinent labs & imaging results that were available during my care of the patient were reviewed by me and considered in my medical decision making (see chart for details).   Skin abscess.  Treating with doxycycline.  Wound care instructions and signs of worsening infection.  Instructed patient to follow-up with his PCP if his symptoms or not improving.  Education provided on skin abscess.  Patient agrees to plan of care.   Final Clinical Impressions(s) / UC Diagnoses   Final diagnoses:  Abscess     Discharge Instructions      Take the doxycycline as directed.  Follow up with your primary care provider if your symptoms are not improving.        ED Prescriptions     Medication Sig Dispense Auth. Provider   doxycycline (VIBRAMYCIN) 100 MG capsule Take 1 capsule (100 mg total) by mouth 2 (two) times daily for 7 days. 14 capsule Sharion Balloon, NP      PDMP not reviewed this encounter.   Sharion Balloon, NP 12/24/21 [phone]

## 2021-12-24 NOTE — ED Triage Notes (Signed)
Patient to Urgent Care with complaints of  appeared on Monday.   Area is present to inside of right elbow, reports it started draining purulent/ bloody drainage last night.

## 2021-12-24 NOTE — Discharge Instructions (Addendum)
Take the doxycycline as directed.    Follow up with your primary care provider if your symptoms are not improving.    

## 2022-02-25 ENCOUNTER — Ambulatory Visit (INDEPENDENT_AMBULATORY_CARE_PROVIDER_SITE_OTHER): Payer: 59 | Admitting: Family Medicine

## 2022-02-25 ENCOUNTER — Encounter: Payer: Self-pay | Admitting: Family Medicine

## 2022-02-25 VITALS — BP 128/76 | HR 89 | Temp 98.7°F | Ht 70.0 in | Wt 278.2 lb

## 2022-02-25 DIAGNOSIS — Z0000 Encounter for general adult medical examination without abnormal findings: Secondary | ICD-10-CM | POA: Diagnosis not present

## 2022-02-25 DIAGNOSIS — Z23 Encounter for immunization: Secondary | ICD-10-CM | POA: Diagnosis not present

## 2022-02-25 DIAGNOSIS — F17210 Nicotine dependence, cigarettes, uncomplicated: Secondary | ICD-10-CM

## 2022-02-25 DIAGNOSIS — Z131 Encounter for screening for diabetes mellitus: Secondary | ICD-10-CM | POA: Diagnosis not present

## 2022-02-25 DIAGNOSIS — Z6839 Body mass index (BMI) 39.0-39.9, adult: Secondary | ICD-10-CM | POA: Diagnosis not present

## 2022-02-25 DIAGNOSIS — E785 Hyperlipidemia, unspecified: Secondary | ICD-10-CM

## 2022-02-25 DIAGNOSIS — I10 Essential (primary) hypertension: Secondary | ICD-10-CM

## 2022-02-25 DIAGNOSIS — Z130 Encounter for screening for diseases of the blood and blood-forming organs and certain disorders involving the immune mechanism: Secondary | ICD-10-CM

## 2022-02-25 LAB — CBC WITH DIFFERENTIAL/PLATELET
Basophils Absolute: 0 10*3/uL (ref 0.0–0.1)
Basophils Relative: 0.4 % (ref 0.0–3.0)
Eosinophils Absolute: 0.1 10*3/uL (ref 0.0–0.7)
Eosinophils Relative: 1 % (ref 0.0–5.0)
HCT: 51.2 % (ref 39.0–52.0)
Hemoglobin: 17.2 g/dL — ABNORMAL HIGH (ref 13.0–17.0)
Lymphocytes Relative: 27.6 % (ref 12.0–46.0)
Lymphs Abs: 2.4 10*3/uL (ref 0.7–4.0)
MCHC: 33.5 g/dL (ref 30.0–36.0)
MCV: 87.4 fl (ref 78.0–100.0)
Monocytes Absolute: 0.9 10*3/uL (ref 0.1–1.0)
Monocytes Relative: 10 % (ref 3.0–12.0)
Neutro Abs: 5.3 10*3/uL (ref 1.4–7.7)
Neutrophils Relative %: 61 % (ref 43.0–77.0)
Platelets: 231 10*3/uL (ref 150.0–400.0)
RBC: 5.86 Mil/uL — ABNORMAL HIGH (ref 4.22–5.81)
RDW: 13.5 % (ref 11.5–15.5)
WBC: 8.6 10*3/uL (ref 4.0–10.5)

## 2022-02-25 LAB — LIPID PANEL
Cholesterol: 196 mg/dL (ref 0–200)
HDL: 31.4 mg/dL — ABNORMAL LOW (ref >39.00)
LDL Cholesterol: 137 mg/dL — ABNORMAL HIGH (ref 0–99)
NonHDL: 165.04
Total CHOL/HDL Ratio: 6
Triglycerides: 139 mg/dL (ref 0.0–149.0)
VLDL: 27.8 mg/dL (ref 0.0–40.0)

## 2022-02-25 LAB — COMPREHENSIVE METABOLIC PANEL
ALT: 30 U/L (ref 0–53)
AST: 24 U/L (ref 0–37)
Albumin: 4.6 g/dL (ref 3.5–5.2)
Alkaline Phosphatase: 68 U/L (ref 39–117)
BUN: 16 mg/dL (ref 6–23)
CO2: 27 mEq/L (ref 19–32)
Calcium: 9.9 mg/dL (ref 8.4–10.5)
Chloride: 103 mEq/L (ref 96–112)
Creatinine, Ser: 1.13 mg/dL (ref 0.40–1.50)
GFR: 81.78 mL/min (ref >60.00)
Glucose, Bld: 89 mg/dL (ref 70–99)
Potassium: 5.3 mEq/L — ABNORMAL HIGH (ref 3.5–5.1)
Sodium: 139 mEq/L (ref 135–145)
Total Bilirubin: 1.2 mg/dL (ref 0.2–1.2)
Total Protein: 7.5 g/dL (ref 6.0–8.3)

## 2022-02-25 LAB — HEMOGLOBIN A1C: Hgb A1c MFr Bld: 5.6 % (ref 4.6–6.5)

## 2022-02-25 MED ORDER — LISINOPRIL 10 MG PO TABS: 10.0000 mg | ORAL_TABLET | Freq: Every day | ORAL | 3 refills | Status: DC

## 2022-02-25 NOTE — Progress Notes (Signed)
Subjective:  Patient ID: Bradley Morales, male    DOB: 16-Sep-1980  Age: 42 y.o. MRN: [id number]  CC:  Chief Complaint  Patient presents with   Annual Exam    Pt is fasting, no concerns     HPI Bradley Morales presents for Annual Exam No health changes since last visit. Wife had stroke in October. Vision and balance issues, improving.   Hypertension: Lisinopril 10 mg daily Home readings: none No new side effects.  BP Readings from Last 3 Encounters:  02/25/22 128/76  12/24/21 131/88  08/20/21 122/70   Lab Results  Component Value Date   CREATININE 1.09 08/20/2021   Obstructive sleep apnea Previously test in 2015, did not tolerate CPAP.  Avoiding supine sleep position, rested during the day without nighttime wakening or sleeping on side.still doing well.   Nicotine addiction 1 pack/day, approximate 19 pack years.  Did quit in the past for 2 years then restarted.  Not ready to quit last year, currently 1 pack per day and vaping to try to cut back on cigarettes.   Obesity With elevated LDL on previous labs, no current statin.  Family history of early CAD/CVD.  Diet/exercise discussed.  No recent changes. Still active at home and physical work.   Wt Readings from Last 3 Encounters:  02/25/22 278 lb 3.2 oz (126.2 kg)  12/24/21 289 lb (131.1 kg)  08/20/21 277 lb 9.6 oz (125.9 kg)  Body mass index is 39.92 kg/m. Usually loses weight in summer.   Lab Results  Component Value Date   CHOL 174 08/20/2021   HDL 32.00 (L) 08/20/2021   LDLCALC 121 (H) 08/20/2021   TRIG 105.0 08/20/2021   CHOLHDL 5 08/20/2021      02/25/2022    8:00 AM 08/20/2021    8:37 AM 02/13/2021    7:59 AM 08/07/2020    8:44 AM 01/31/2020   10:54 AM  Depression screen PHQ 2/9  Decreased Interest 0 0 0 0 0  Down, Depressed, Hopeless 0 0 0 0 0  PHQ - 2 Score 0 0 0 0 0  Altered sleeping   0    Tired, decreased energy   0    Change in appetite   0    Feeling bad or failure about yourself    0    Trouble  concentrating   0    Moving slowly or fidgety/restless   0    Suicidal thoughts   0    PHQ-9 Score   0      Health Maintenance  Topic Date Due   INFLUENZA VACCINE  08/18/2021   COVID-19 Vaccine (4 - 2023-24 season) 03/13/2022 (Originally 09/18/2021)   DTaP/Tdap/Td (3 - Td or Tdap) 01/30/2030   Hepatitis C Screening  Completed   HIV Screening  Completed   HPV VACCINES  Aged Out  No fh of colon or prostate ca.    Immunization History  Administered Date(s) Administered   Influenza Split 11/03/2011, 09/05/2012   Influenza,inj,Quad PF,6+ Mos 12/20/2018, 02/13/2021   Influenza-Unspecified 11/07/2013, 11/14/2014, 12/05/2019   Moderna Sars-Covid-2 Vaccination 04/26/2019, 05/24/2019, 12/25/2019   Td 01/31/2020   Tdap 01/18/2010  Flu vaccine today.  Covid booster - option discussed at pharmacy   No results found. Optho - plans to schedule. Glasses used only as needed.   Dental: every 6 months. Appt next week.   Alcohol: 1 per week or less.   Tobacco: as above - cutting back.   Exercise: as above, FITT principle  discussed.    History Patient Active Problem List   Diagnosis Date Noted   Obesity due to excess calories    Snoring disorder    HTN (hypertension) 08/30/2012   Palpitations 04/06/2012   OSA (obstructive sleep apnea) 03/06/2012   Nicotine addiction 11/05/2011   BMI 45.0-49.9, adult ([facility]) 11/05/2011   Past Medical History:  Diagnosis Date   Bell's palsy    Hypertension    Obesity due to excess calories    Snoring disorder    Past Surgical History:  Procedure Laterality Date   CYST EXCISION N/A 09/30/2017   Procedure: EXCISION OF POSTERIOR NECK CYST AND LOWER BACK CYST;  Surgeon: Ralene Ok, MD;  Location: [facility];  Service: General;  Laterality: N/A;   VASECTOMY     WISDOM TOOTH EXTRACTION     No Known Allergies Prior to Admission medications   Medication Sig Start Date End Date Taking? Authorizing Provider  lisinopril (ZESTRIL) 10 MG tablet Take 1  tablet (10 mg total) by mouth daily. 08/20/21  Yes [name], MD   Social History   Socioeconomic History   Marital status: Married    Spouse name: Melissa   Number of children: 1   Years of education: 12   Highest education level: Not on file  Occupational History   Occupation: Music therapist: [employer]   Occupation: Music therapist: [employer]  Tobacco Use   Smoking status: Every Day    Packs/day: 1.00    Years: 10.00    Total pack years: 10.00    Types: Cigarettes   Smokeless tobacco: Never  Vaping Use   Vaping Use: Former  Substance and Sexual Activity   Alcohol use: Yes    Comment: rarely   Drug use: No   Sexual activity: Yes    Birth control/protection: None    Comment: 1 partner in last 12 months  Other Topics Concern   Not on file  Social History Narrative   Patient is married ([place]) and lives at home with his family.   Patient has one child.   Patient is working full-time.   Patient has a high school education.   Patient is right-handed.   Patient drinks 3 cups of soda per day.   Social Determinants of Health   Financial Resource Strain: Not on file  Food Insecurity: Not on file  Transportation Needs: Not on file  Physical Activity: Not on file  Stress: Not on file  Social Connections: Not on file  Intimate Partner Violence: Not on file    Review of Systems  13 point review of systems per patient health survey noted.  Negative other than as indicated above or in HPI.   Objective:   Vitals:   02/25/22 0802  BP: 128/76  Pulse: 89  Temp: 98.7 F (37.1 C)  TempSrc: Temporal  SpO2: 99%  Weight: 278 lb 3.2 oz (126.2 kg)  Height: '5\' 10"'$  (1.778 m)     Physical Exam Vitals reviewed.  Constitutional:      General: He is not in acute distress.    Appearance: He is well-developed. He is obese. He is not ill-appearing or diaphoretic.  HENT:     Head: Normocephalic and atraumatic.     Right Ear: External  ear normal.     Left Ear: External ear normal.  Eyes:     Conjunctiva/sclera: Conjunctivae normal.     Pupils: Pupils are equal, round, and reactive to light.  Neck:     Thyroid: No thyromegaly.  Cardiovascular:     Rate and Rhythm: Normal rate and regular rhythm.     Heart sounds: Normal heart sounds.  Pulmonary:     Effort: Pulmonary effort is normal. No respiratory distress.     Breath sounds: Normal breath sounds. No wheezing.  Abdominal:     General: There is no distension.     Palpations: Abdomen is soft.     Tenderness: There is no abdominal tenderness.  Musculoskeletal:        General: No tenderness. Normal range of motion.     Cervical back: Normal range of motion and neck supple.  Lymphadenopathy:     Cervical: No cervical adenopathy.  Skin:    General: Skin is warm and dry.  Neurological:     Mental Status: He is alert and oriented to person, place, and time.     Deep Tendon Reflexes: Reflexes are normal and symmetric.  Psychiatric:        Behavior: Behavior normal.       Assessment & Plan:  Bradley C Badeaux is a 42 y.o. male . Annual physical exam - -anticipatory guidance as below in AVS, screening labs above. Health maintenance items as above in HPI discussed/recommended as applicable.   Essential hypertension  -  Stable, tolerating current regimen. Medications refilled. Labs pending as above.   Hyperlipidemia, unspecified hyperlipidemia type  - mild elevated ldl prior. Repeat labs, diet/exercise discussed.   Screening for diabetes mellitus (DM)  -A1c has been normal but increasing and borderline on most recent testing.  Repeat A1c today.  Screening for deficiency anemia  -Check CBC  Cigarette nicotine dependence  -Commended on attempts with decreasing with use of vaping with ultimate goal of decreasing and stopping both.  Handout given.  RTC precautions   Need for influenza vaccination -flu vaccine given.   Meds ordered this encounter  Medications    lisinopril (ZESTRIL) 10 MG tablet    Sig: Take 1 tablet (10 mg total) by mouth daily.    Dispense:  90 tablet    Refill:  3    Refills on file if needed.   Patient Instructions  Keep up the good work with trying to quit smoking. Let me know if I can help.  No med changes today.  Thanks for coming in today.  Follow-up in 1 year for a physical unless there are concerns on labs.  Keep an eye on your blood pressure intermittently and if that number goes above 140/90, follow-up with me sooner.  Take care.   Managing the Challenge of Quitting Smoking Quitting smoking is a physical and mental challenge. You may have cravings, withdrawal symptoms, and temptation to smoke. Before quitting, work with your health care provider to make a plan that can help you manage quitting. Making a plan before you quit may keep you from smoking when you have the urge to smoke while trying to quit. How to manage lifestyle changes Managing stress Stress can make you want to smoke, and wanting to smoke may cause stress. It is important to find ways to manage your stress. You could try some of the following: Practice relaxation techniques. Breathe slowly and deeply, in through your nose and out through your mouth. Listen to music. Soak in a bath or take a shower. Imagine a peaceful place or vacation. Get some support. Talk with family or friends about your stress. Join a support group. Talk with a counselor or therapist. Get  some physical activity. Go for a walk, run, or bike ride. Play a favorite sport. Practice yoga.  Medicines Talk with your health care provider about medicines that might help you deal with cravings and make quitting easier for you. Relationships Social situations can be difficult when you are quitting smoking. To manage this, you can: Avoid parties and other social situations where people might be smoking. Avoid alcohol. Leave right away if you have the urge to smoke. Explain to your  family and friends that you are quitting smoking. Ask for support and let them know you might be a bit grumpy. Plan activities where smoking is not an option. General instructions Be aware that many people gain weight after they quit smoking. However, not everyone does. To keep from gaining weight, have a plan in place before you quit, and stick to the plan after you quit. Your plan should include: Eating healthy snacks. When you have a craving, it may help to: Eat popcorn, or try carrots, celery, or other cut vegetables. Chew sugar-free gum. Changing how you eat. Eat small portion sizes at meals. Eat 4-6 small meals throughout the day instead of 1-2 large meals a day. Be mindful when you eat. You should avoid watching television or doing other things that might distract you as you eat. Exercising regularly. Make time to exercise each day. If you do not have time for a long workout, do short bouts of exercise for 5-10 minutes several times a day. Do some form of strengthening exercise, such as weight lifting. Do some exercise that gets your heart beating and causes you to breathe deeply, such as walking fast, running, swimming, or biking. This is very important. Drinking plenty of water or other low-calorie or no-calorie drinks. Drink enough fluid to keep your urine pale yellow.  How to recognize withdrawal symptoms Your body and mind may experience discomfort as you try to get used to not having nicotine in your system. These effects are called withdrawal symptoms. They may include: Feeling hungrier than normal. Having trouble concentrating. Feeling irritable or restless. Having trouble sleeping. Feeling depressed. Craving a cigarette. These symptoms may surprise you, but they are normal to have when quitting smoking. To manage withdrawal symptoms: Avoid places, people, and activities that trigger your cravings. Remember why you want to quit. Get plenty of sleep. Avoid coffee and other  drinks that contain caffeine. These may worsen some of your symptoms. How to manage cravings Come up with a plan for how to deal with your cravings. The plan should include the following: A definition of the specific situation you want to deal with. An activity or action you will take to replace smoking. A clear idea for how this action will help. The name of someone who could help you with this. Cravings usually last for 5-10 minutes. Consider taking the following actions to help you with your plan to deal with cravings: Keep your mouth busy. Chew sugar-free gum. Suck on hard candies or a straw. Brush your teeth. Keep your hands and body busy. Change to a different activity right away. Squeeze or play with a ball. Do an activity or a hobby, such as making bead jewelry, practicing needlepoint, or working with wood. Mix up your normal routine. Take a short exercise break. Go for a quick walk, or run up and down stairs. Focus on doing something kind or helpful for someone else. Call a friend or family member to talk during a craving. Join a support group. Contact a  quitline. Where to find support To get help or find a support group: Call the [facility]'s Smoking Quitline: 1-800-QUIT-NOW (364)754-7042) Text QUIT to SmokefreeTXT: 798921 Where to find more information Visit these websites to find more information on quitting smoking: U.S. Department of Health and Human Services: www.smokefree.gov American Lung Association: www.freedomfromsmoking.org Centers for Disease Control and Prevention (CDC): http://www.wolf.info/ American Heart Association: www.heart.org Contact a health care provider if: You want to change your plan for quitting. The medicines you are taking are not helping. Your eating feels out of control or you cannot sleep. You feel depressed or become very anxious. Summary Quitting smoking is a physical and mental challenge. You will face cravings, withdrawal symptoms,  and temptation to smoke again. Preparation can help you as you go through these challenges. Try different techniques to manage stress, handle social situations, and prevent weight gain. You can deal with cravings by keeping your mouth busy (such as by chewing gum), keeping your hands and body busy, calling family or friends, or contacting a quitline for people who want to quit smoking. You can deal with withdrawal symptoms by avoiding places where people smoke, getting plenty of rest, and avoiding drinks that contain caffeine. This information is not intended to replace advice given to you by your health care provider. Make sure you discuss any questions you have with your health care provider. Document Revised: 12/26/2020 Document Reviewed: 12/26/2020 Elsevier Patient Education  [place] 66-46 Years Old, Male Preventive care refers to lifestyle choices and visits with your health care provider that can promote health and wellness. Preventive care visits are also called wellness exams. What can I expect for my preventive care visit? Counseling During your preventive care visit, your health care provider may ask about your: Medical history, including: Past medical problems. Family medical history. Current health, including: Emotional well-being. Home life and relationship well-being. Sexual activity. Lifestyle, including: Alcohol, nicotine or tobacco, and drug use. Access to firearms. Diet, exercise, and sleep habits. Safety issues such as seatbelt and bike helmet use. Sunscreen use. Work and work Statistician. Physical exam Your health care provider may check your: Height and weight. These may be used to calculate your BMI (body mass index). BMI is a measurement that tells if you are at a healthy weight. Waist circumference. This measures the distance around your waistline. This measurement also tells if you are at a healthy weight and may help predict your  risk of certain diseases, such as type 2 diabetes and high blood pressure. Heart rate and blood pressure. Body temperature. Skin for abnormal spots. What immunizations do I need?  Vaccines are usually given at various ages, according to a schedule. Your health care provider will recommend vaccines for you based on your age, medical history, and lifestyle or other factors, such as travel or where you work. What tests do I need? Screening Your health care provider may recommend screening tests for certain conditions. This may include: Lipid and cholesterol levels. Diabetes screening. This is done by checking your blood sugar (glucose) after you have not eaten for a while (fasting). Hepatitis B test. Hepatitis C test. HIV (human immunodeficiency virus) test. STI (sexually transmitted infection) testing, if you are at risk. Talk with your health care provider about your test results, treatment options, and if necessary, the need for more tests. Follow these instructions at home: Eating and drinking  Eat a healthy diet that includes fresh fruits and vegetables, whole grains, lean protein, and low-fat  dairy products. Drink enough fluid to keep your urine pale yellow. Take vitamin and mineral supplements as recommended by your health care provider. Do not drink alcohol if your health care provider tells you not to drink. If you drink alcohol: Limit how much you have to 0-2 drinks a day. Know how much alcohol is in your drink. In the [place] one drink equals one 12 oz bottle of beer (355 mL), one 5 oz glass of wine (148 mL), or one 1 oz glass of hard liquor (44 mL). Lifestyle Brush your teeth every morning and night with fluoride toothpaste. Floss one time each day. Exercise for at least 30 minutes 5 or more days each week. Do not use any products that contain nicotine or tobacco. These products include cigarettes, chewing tobacco, and vaping devices, such as e-cigarettes. If you need help  quitting, ask your health care provider. Do not use drugs. If you are sexually active, practice safe sex. Use a condom or other form of protection to prevent STIs. Find healthy ways to manage stress, such as: Meditation, yoga, or listening to music. Journaling. Talking to a trusted person. Spending time with friends and family. Minimize exposure to UV radiation to reduce your risk of skin cancer. Safety Always wear your seat belt while driving or riding in a vehicle. Do not drive: If you have been drinking alcohol. Do not ride with someone who has been drinking. If you have been using any mind-altering substances or drugs. While texting. When you are tired or distracted. Wear a helmet and other protective equipment during sports activities. If you have firearms in your house, make sure you follow all gun safety procedures. Seek help if you have been physically or sexually abused. What's next? Go to your health care provider once a year for an annual wellness visit. Ask your health care provider how often you should have your eyes and teeth checked. Stay up to date on all vaccines. This information is not intended to replace advice given to you by your health care provider. Make sure you discuss any questions you have with your health care provider. Document Revised: 07/02/2020 Document Reviewed: 07/02/2020 Elsevier Patient Education  [place],   Merri Ray, MD [place], [facility] 02/25/22 8:39 AM

## 2022-02-25 NOTE — Patient Instructions (Addendum)
Keep up the good work with trying to quit smoking. Let me know if I can help.  No med changes today.  Thanks for coming in today.  Follow-up in 1 year for a physical unless there are concerns on labs.  Keep an eye on your blood pressure intermittently and if that number goes above 140/90, follow-up with me sooner.  Take care.   Managing the Challenge of Quitting Smoking Quitting smoking is a physical and mental challenge. You may have cravings, withdrawal symptoms, and temptation to smoke. Before quitting, work with your health care provider to make a plan that can help you manage quitting. Making a plan before you quit may keep you from smoking when you have the urge to smoke while trying to quit. How to manage lifestyle changes Managing stress Stress can make you want to smoke, and wanting to smoke may cause stress. It is important to find ways to manage your stress. You could try some of the following: Practice relaxation techniques. Breathe slowly and deeply, in through your nose and out through your mouth. Listen to music. Soak in a bath or take a shower. Imagine a peaceful place or vacation. Get some support. Talk with family or friends about your stress. Join a support group. Talk with a counselor or therapist. Get some physical activity. Go for a walk, run, or bike ride. Play a favorite sport. Practice yoga.  Medicines Talk with your health care provider about medicines that might help you deal with cravings and make quitting easier for you. Relationships Social situations can be difficult when you are quitting smoking. To manage this, you can: Avoid parties and other social situations where people might be smoking. Avoid alcohol. Leave right away if you have the urge to smoke. Explain to your family and friends that you are quitting smoking. Ask for support and let them know you might be a bit grumpy. Plan activities where smoking is not an option. General instructions Be  aware that many people gain weight after they quit smoking. However, not everyone does. To keep from gaining weight, have a plan in place before you quit, and stick to the plan after you quit. Your plan should include: Eating healthy snacks. When you have a craving, it may help to: Eat popcorn, or try carrots, celery, or other cut vegetables. Chew sugar-free gum. Changing how you eat. Eat small portion sizes at meals. Eat 4-6 small meals throughout the day instead of 1-2 large meals a day. Be mindful when you eat. You should avoid watching television or doing other things that might distract you as you eat. Exercising regularly. Make time to exercise each day. If you do not have time for a long workout, do short bouts of exercise for 5-10 minutes several times a day. Do some form of strengthening exercise, such as weight lifting. Do some exercise that gets your heart beating and causes you to breathe deeply, such as walking fast, running, swimming, or biking. This is very important. Drinking plenty of water or other low-calorie or no-calorie drinks. Drink enough fluid to keep your urine pale yellow.  How to recognize withdrawal symptoms Your body and mind may experience discomfort as you try to get used to not having nicotine in your system. These effects are called withdrawal symptoms. They may include: Feeling hungrier than normal. Having trouble concentrating. Feeling irritable or restless. Having trouble sleeping. Feeling depressed. Craving a cigarette. These symptoms may surprise you, but they are normal to have when quitting  smoking. To manage withdrawal symptoms: Avoid places, people, and activities that trigger your cravings. Remember why you want to quit. Get plenty of sleep. Avoid coffee and other drinks that contain caffeine. These may worsen some of your symptoms. How to manage cravings Come up with a plan for how to deal with your cravings. The plan should include the  following: A definition of the specific situation you want to deal with. An activity or action you will take to replace smoking. A clear idea for how this action will help. The name of someone who could help you with this. Cravings usually last for 5-10 minutes. Consider taking the following actions to help you with your plan to deal with cravings: Keep your mouth busy. Chew sugar-free gum. Suck on hard candies or a straw. Brush your teeth. Keep your hands and body busy. Change to a different activity right away. Squeeze or play with a ball. Do an activity or a hobby, such as making bead jewelry, practicing needlepoint, or working with wood. Mix up your normal routine. Take a short exercise break. Go for a quick walk, or run up and down stairs. Focus on doing something kind or helpful for someone else. Call a friend or family member to talk during a craving. Join a support group. Contact a quitline. Where to find support To get help or find a support group: Call the [facility]'s Smoking Quitline: 1-800-QUIT-NOW 8062880167) Text QUIT to SmokefreeTXT: 341937 Where to find more information Visit these websites to find more information on quitting smoking: U.S. Department of Health and Human Services: www.smokefree.gov American Lung Association: www.freedomfromsmoking.org Centers for Disease Control and Prevention (CDC): http://www.wolf.info/ American Heart Association: www.heart.org Contact a health care provider if: You want to change your plan for quitting. The medicines you are taking are not helping. Your eating feels out of control or you cannot sleep. You feel depressed or become very anxious. Summary Quitting smoking is a physical and mental challenge. You will face cravings, withdrawal symptoms, and temptation to smoke again. Preparation can help you as you go through these challenges. Try different techniques to manage stress, handle social situations, and prevent  weight gain. You can deal with cravings by keeping your mouth busy (such as by chewing gum), keeping your hands and body busy, calling family or friends, or contacting a quitline for people who want to quit smoking. You can deal with withdrawal symptoms by avoiding places where people smoke, getting plenty of rest, and avoiding drinks that contain caffeine. This information is not intended to replace advice given to you by your health care provider. Make sure you discuss any questions you have with your health care provider. Document Revised: 12/26/2020 Document Reviewed: 12/26/2020 Elsevier Patient Education  [place] 28-22 Years Old, Male Preventive care refers to lifestyle choices and visits with your health care provider that can promote health and wellness. Preventive care visits are also called wellness exams. What can I expect for my preventive care visit? Counseling During your preventive care visit, your health care provider may ask about your: Medical history, including: Past medical problems. Family medical history. Current health, including: Emotional well-being. Home life and relationship well-being. Sexual activity. Lifestyle, including: Alcohol, nicotine or tobacco, and drug use. Access to firearms. Diet, exercise, and sleep habits. Safety issues such as seatbelt and bike helmet use. Sunscreen use. Work and work Statistician. Physical exam Your health care provider may check your: Height and weight. These may be used  to calculate your BMI (body mass index). BMI is a measurement that tells if you are at a healthy weight. Waist circumference. This measures the distance around your waistline. This measurement also tells if you are at a healthy weight and may help predict your risk of certain diseases, such as type 2 diabetes and high blood pressure. Heart rate and blood pressure. Body temperature. Skin for abnormal spots. What immunizations do  I need?  Vaccines are usually given at various ages, according to a schedule. Your health care provider will recommend vaccines for you based on your age, medical history, and lifestyle or other factors, such as travel or where you work. What tests do I need? Screening Your health care provider may recommend screening tests for certain conditions. This may include: Lipid and cholesterol levels. Diabetes screening. This is done by checking your blood sugar (glucose) after you have not eaten for a while (fasting). Hepatitis B test. Hepatitis C test. HIV (human immunodeficiency virus) test. STI (sexually transmitted infection) testing, if you are at risk. Talk with your health care provider about your test results, treatment options, and if necessary, the need for more tests. Follow these instructions at home: Eating and drinking  Eat a healthy diet that includes fresh fruits and vegetables, whole grains, lean protein, and low-fat dairy products. Drink enough fluid to keep your urine pale yellow. Take vitamin and mineral supplements as recommended by your health care provider. Do not drink alcohol if your health care provider tells you not to drink. If you drink alcohol: Limit how much you have to 0-2 drinks a day. Know how much alcohol is in your drink. In the [place] one drink equals one 12 oz bottle of beer (355 mL), one 5 oz glass of wine (148 mL), or one 1 oz glass of hard liquor (44 mL). Lifestyle Brush your teeth every morning and night with fluoride toothpaste. Floss one time each day. Exercise for at least 30 minutes 5 or more days each week. Do not use any products that contain nicotine or tobacco. These products include cigarettes, chewing tobacco, and vaping devices, such as e-cigarettes. If you need help quitting, ask your health care provider. Do not use drugs. If you are sexually active, practice safe sex. Use a condom or other form of protection to prevent STIs. Find healthy  ways to manage stress, such as: Meditation, yoga, or listening to music. Journaling. Talking to a trusted person. Spending time with friends and family. Minimize exposure to UV radiation to reduce your risk of skin cancer. Safety Always wear your seat belt while driving or riding in a vehicle. Do not drive: If you have been drinking alcohol. Do not ride with someone who has been drinking. If you have been using any mind-altering substances or drugs. While texting. When you are tired or distracted. Wear a helmet and other protective equipment during sports activities. If you have firearms in your house, make sure you follow all gun safety procedures. Seek help if you have been physically or sexually abused. What's next? Go to your health care provider once a year for an annual wellness visit. Ask your health care provider how often you should have your eyes and teeth checked. Stay up to date on all vaccines. This information is not intended to replace advice given to you by your health care provider. Make sure you discuss any questions you have with your health care provider. Document Revised: 07/02/2020 Document Reviewed: 07/02/2020 Elsevier Patient Education  2023 Elsevier  Inc.

## 2022-03-09 ENCOUNTER — Other Ambulatory Visit: Payer: Self-pay | Admitting: Family Medicine

## 2022-03-09 DIAGNOSIS — D582 Other hemoglobinopathies: Secondary | ICD-10-CM

## 2022-03-09 DIAGNOSIS — E875 Hyperkalemia: Secondary | ICD-10-CM

## 2022-03-09 NOTE — Progress Notes (Signed)
See lab notes

## 2022-07-10 ENCOUNTER — Other Ambulatory Visit: Payer: Self-pay

## 2022-07-10 ENCOUNTER — Emergency Department (HOSPITAL_COMMUNITY): Payer: 59

## 2022-07-10 ENCOUNTER — Emergency Department (HOSPITAL_COMMUNITY)
Admission: EM | Admit: 2022-07-10 | Discharge: 2022-07-10 | Disposition: A | Discharge status: Home / Self Care | Payer: 59 | Attending: Emergency Medicine | Admitting: Emergency Medicine

## 2022-07-10 ENCOUNTER — Encounter (HOSPITAL_COMMUNITY): Payer: Self-pay | Admitting: Emergency Medicine

## 2022-07-10 DIAGNOSIS — R079 Chest pain, unspecified: Secondary | ICD-10-CM | POA: Diagnosis present

## 2022-07-10 DIAGNOSIS — R072 Precordial pain: Secondary | ICD-10-CM | POA: Diagnosis not present

## 2022-07-10 DIAGNOSIS — I10 Essential (primary) hypertension: Secondary | ICD-10-CM | POA: Insufficient documentation

## 2022-07-10 LAB — BASIC METABOLIC PANEL
Anion gap: 9 (ref 5–15)
BUN: 11 mg/dL (ref 6–20)
CO2: 26 mmol/L (ref 22–32)
Calcium: 9 mg/dL (ref 8.9–10.3)
Chloride: 103 mmol/L (ref 98–111)
Creatinine, Ser: 1.24 mg/dL (ref 0.61–1.24)
GFR, Estimated: >60 mL/min (ref >60)
Glucose, Bld: 98 mg/dL (ref 70–99)
Potassium: 3.8 mmol/L (ref 3.5–5.1)
Sodium: 138 mmol/L (ref 135–145)

## 2022-07-10 LAB — CBC
HCT: 51.2 % (ref 39.0–52.0)
Hemoglobin: 16.6 g/dL (ref 13.0–17.0)
MCH: 28.4 pg (ref 26.0–34.0)
MCHC: 32.4 g/dL (ref 30.0–36.0)
MCV: 87.5 fL (ref 80.0–100.0)
Platelets: 237 10*3/uL (ref 150–400)
RBC: 5.85 MIL/uL — ABNORMAL HIGH (ref 4.22–5.81)
RDW: 13 % (ref 11.5–15.5)
WBC: 9.3 10*3/uL (ref 4.0–10.5)
nRBC: 0 % (ref 0.0–0.2)

## 2022-07-10 LAB — TROPONIN I (HIGH SENSITIVITY)
Troponin I (High Sensitivity): 5 ng/L
Troponin I (High Sensitivity): 4 ng/L (ref <18)

## 2022-07-10 MED ORDER — FAMOTIDINE 20 MG PO TABS: 20.0000 mg | ORAL_TABLET | Freq: Two times a day (BID) | ORAL | 0 refills | Status: DC

## 2022-07-10 NOTE — ED Provider Notes (Signed)
Patient seen after prior EDP.  Patient's ED evaluation is without evidence of significant acute pathology.  Patient is reassured and is comfortable and desires discharge home.  Importance of close follow-up is stressed.  Patient is agreeable with plan to trial Pepcid in the outpatient setting.  Patient also understands need for close follow-up with both PCP and also with cardiology.   Wynetta Fines, MD 07/10/22 [phone]

## 2022-07-10 NOTE — ED Notes (Signed)
Patient returned from X-ray. Stretcher locked in lowest position, call bell in reach.

## 2022-07-10 NOTE — ED Triage Notes (Signed)
Pt c/o left arm tingling after having a panic attack when the pt woke up with indigestion chest pain.

## 2022-07-10 NOTE — ED Provider Notes (Signed)
Emergency Department Provider Note   I have reviewed the triage vital signs and the nursing notes.   HISTORY  Chief Complaint Numbness and Chest Pain   HPI Bradley Morales is a 42 y.o. male with PMH of HTN presents to the ED with burning CP that woke him from sleep. He had pain begin around 3 AM. He developed heart palpitations as well as numbness in the left arm. He took a baby ASA and ultimately presented to the ED for evaluation. No active symptoms at this time. Describes a remote history of "panic attacks" but not sure if this was one or now. No SOB. No ACS history.    Past Medical History:  Diagnosis Date   Bell's palsy    Hypertension    Obesity due to excess calories    Snoring disorder     Review of Systems  Constitutional: No fever/chills Cardiovascular: Positive chest pain and palpitations.  Respiratory: Denies shortness of breath. Gastrointestinal: No abdominal pain.  No nausea, no vomiting.  No diarrhea.  No constipation. Genitourinary: Negative for dysuria. Musculoskeletal: Negative for back pain. Skin: Negative for rash. Neurological: Negative for headaches, focal weakness. Numbness to the left arm (resolved) - pins and needles sensation.   ____________________________________________   PHYSICAL EXAM:  VITAL SIGNS: ED Triage Vitals  Enc Vitals Group     BP 07/10/22 0516 (!) 149/92     Pulse Rate 07/10/22 0516 82     Resp 07/10/22 0516 16     Temp 07/10/22 0516 97.9 F (36.6 C)     Temp src --      SpO2 07/10/22 0516 100 %     Weight 07/10/22 0515 278 lb 3.5 oz (126.2 kg)     Height 07/10/22 0515 5\' 10"  (1.778 m)   Constitutional: Alert and oriented. Well appearing and in no acute distress. Eyes: Conjunctivae are normal. Head: Atraumatic. Nose: No congestion/rhinnorhea. Mouth/Throat: Mucous membranes are moist.   Neck: No stridor. Cardiovascular: Normal rate, regular rhythm. Good peripheral circulation. Grossly normal heart sounds.    Respiratory: Normal respiratory effort.  No retractions. Lungs CTAB. Gastrointestinal: Soft and nontender. No distention.  Musculoskeletal: No lower extremity tenderness nor edema. No gross deformities of extremities. Neurologic:  Normal speech and language. No gross focal neurologic deficits are appreciated. 5/5 strength in the bilateral upper extremities. Normal sensation bilaterally.  Skin:  Skin is warm, dry and intact. No rash noted.   ____________________________________________   LABS (all labs ordered are listed, but only abnormal results are displayed)  Labs Reviewed  CBC - Abnormal; Notable for the following components:      Result Value   RBC 5.85 (*)    All other components within normal limits  BASIC METABOLIC PANEL  TROPONIN I (HIGH SENSITIVITY)  TROPONIN I (HIGH SENSITIVITY)   ____________________________________________  EKG   EKG Interpretation  Date/Time:  Saturday July 10 2022 05:19:56 EDT Ventricular Rate:  83 PR Interval:  150 QRS Duration: 74 QT Interval:  352 QTC Calculation: 413 R Axis:   75 Text Interpretation: Normal sinus rhythm with sinus arrhythmia Normal ECG When compared with ECG of 23-Sep-2017 15:54, PREVIOUS ECG IS PRESENT Confirmed by Alona Bene [phone]) on 07/10/2022 5:27:07 AM       ____________________________________________   PROCEDURES  Procedure(s) performed:   Procedures  None  ____________________________________________   INITIAL IMPRESSION / ASSESSMENT AND PLAN / ED COURSE  Pertinent labs & imaging results that were available during my care of the patient were reviewed  by me and considered in my medical decision making (see chart for details).   This patient is Presenting for Evaluation of CP, which does require a range of treatment options, and is a complaint that involves a high risk of morbidity and mortality.  The Differential Diagnoses includes but is not exclusive to acute coronary syndrome, aortic  dissection, pulmonary embolism, cardiac tamponade, community-acquired pneumonia, pericarditis, musculoskeletal chest wall pain, etc.    Clinical Laboratory Tests Ordered, included troponin negative x 1. CBC without anemia. No AKI.   Radiologic Tests Ordered, included CXR. I independently interpreted the images and agree with radiology interpretation.   Cardiac Monitor Tracing which shows NSR.    Social Determinants of Health Risk patient is a smoker.   Medical Decision Making: Summary:  Patient presents to the ED with CP and left arm tingling. No focal neuro deficits to strongly suspect CVA. Plan for delta troponin.   Reevaluation with update and discussion with patient. Initial troponin negative. Will place Cardiology referral. Second troponin pending. Care transferred to Dr. Rodena Medin. Anticipate d/c if negative.   Considered admission but remaining workup pending.   Patient's presentation is most consistent with acute presentation with potential threat to life or bodily function.   Disposition: pending   ____________________________________________  FINAL CLINICAL IMPRESSION(S) / ED DIAGNOSES  Final diagnoses:  Precordial chest pain     NEW OUTPATIENT MEDICATIONS STARTED DURING THIS VISIT:  Discharge Medication List as of 07/10/2022  8:55 AM     START taking these medications   Details  famotidine (PEPCID) 20 MG tablet Take 1 tablet (20 mg total) by mouth 2 (two) times daily., Starting Sat 07/10/2022, Normal        Note:  This document was prepared using Dragon voice recognition software and may include unintentional dictation errors.  Alona Bene, MD, [facility]    Tikisha Molinaro, Arlyss Repress, MD 07/11/22 [phone]

## 2022-07-10 NOTE — Discharge Instructions (Addendum)

## 2022-07-10 NOTE — ED Notes (Addendum)
Patient transported to x-ray. ?

## 2022-09-23 ENCOUNTER — Encounter: Payer: Self-pay | Admitting: Cardiovascular Disease

## 2022-09-23 ENCOUNTER — Ambulatory Visit: Payer: 59 | Attending: Cardiovascular Disease | Admitting: Cardiovascular Disease

## 2022-09-23 VITALS — BP 138/80 | HR 87 | Ht 71.0 in | Wt 278.6 lb

## 2022-09-23 DIAGNOSIS — R0789 Other chest pain: Secondary | ICD-10-CM

## 2022-09-23 NOTE — Patient Instructions (Signed)
Medication Instructions:  Your physician recommends that you continue on your current medications as directed. Please refer to the Current Medication list given to you today.  *If you need a refill on your cardiac medications before your next appointment, please call your pharmacy*  Lab Work: If you have labs (blood work) drawn today and your tests are completely normal, you will receive your results only by: MyChart Message (if you have MyChart) OR A paper copy in the mail If you have any lab test that is abnormal or we need to change your treatment, we will call you to review the results.  Testing/Procedures: None ordered today.  Follow-Up: At [facility], you and your health needs are our priority.  As part of our continuing mission to provide you with exceptional heart care, we have created designated Provider Care Teams.  These Care Teams include your primary Cardiologist (physician) and Advanced Practice Providers (APPs -  Physician Assistants and Nurse Practitioners) who all work together to provide you with the care you need, when you need it.  We recommend signing up for the patient portal called "MyChart".  Sign up information is provided on this After Visit Summary.  MyChart is used to connect with patients for Virtual Visits (Telemedicine).  Patients are able to view lab/test results, encounter notes, upcoming appointments, etc.  Non-urgent messages can be sent to your provider as well.   To learn more about what you can do with MyChart, go to [email or web address].    Your next appointment:   As needed   Provider:   Kristeen Miss, MD

## 2022-09-23 NOTE — Progress Notes (Signed)
  Cardiology Office Note:  .   Date:  09/23/2022  ID:  Bradley Morales, DOB July 17, 1980, MRN [id number] PCP: Shade Flood, MD  [facility] Providers Cardiologist:  Haeven Morales  Click to update primary MD,subspecialty MD or APP then REFRESH:1}   History of Present Illness: .   Bradley Morales is a 42 y.o. 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  No ST or T wave changes  Risk Assessment/Calculations:             Physical Exam:   VS:  BP 138/80   Pulse 87   Ht 5\' 11"  (1.803 m)   Wt 278 lb 9.6 oz (126.4 kg)   SpO2 99%   BMI 38.86 kg/m    Wt Readings from Last 3 Encounters:  09/23/22 278 lb 9.6 oz (126.4 kg)  2022-07-27 278 lb 3.5 oz (126.2 kg)  02/25/22 278 lb 3.2 oz (126.2 kg)    GEN: Well nourished, well developed in no acute distress NECK: No JVD; No carotid bruits CARDIAC: RRR, no murmurs, rubs, gallops RESPIRATORY:  Clear to auscultation without rales, wheezing or rhonchi  ABDOMEN: Soft, non-tender, non-distended EXTREMITIES:  No edema; No deformity   ASSESSMENT AND PLAN: .      1.  Atypical chest pain: Bradley woke up with severe indigestion in mid June, 2024.  The pain lasted for several hours.  Workup in the ER was negative for coronary ischemia.  He is not had any recurrent pain.  His pain was very atypical and he has not had any since.  He continues to smoke 1 pack of cigarettes a day.  I have advised him to stop smoking.  Have also advised him to work on getting more aerobic exercise.  He needs to avoid fatty foods and fried foods.  I have given him ER precautions.  At this point I do not think he needs any additional workup.   He does need to modify his risk factors such as smoking, obesity, hyperlipidemia.  I will have him follow-up with his primary medical doctor.  Will see him as needed.       Dispo: PRN   Signed, Kristeen Miss, MD

## 2023-01-06 ENCOUNTER — Encounter: Payer: Self-pay | Admitting: Family Medicine

## 2023-01-06 ENCOUNTER — Ambulatory Visit: Payer: 59 | Admitting: Family Medicine

## 2023-01-06 VITALS — BP 128/80 | HR 80 | Temp 98.0°F | Ht 71.0 in | Wt 297.8 lb

## 2023-01-06 DIAGNOSIS — S01311D Laceration without foreign body of right ear, subsequent encounter: Secondary | ICD-10-CM | POA: Diagnosis not present

## 2023-01-06 DIAGNOSIS — S8701XD Crushing injury of right knee, subsequent encounter: Secondary | ICD-10-CM | POA: Diagnosis not present

## 2023-01-06 DIAGNOSIS — S8781XD Crushing injury of right lower leg, subsequent encounter: Secondary | ICD-10-CM | POA: Diagnosis not present

## 2023-01-06 DIAGNOSIS — Z23 Encounter for immunization: Secondary | ICD-10-CM

## 2023-01-06 DIAGNOSIS — W64XXXD Exposure to other animate mechanical forces, subsequent encounter: Secondary | ICD-10-CM

## 2023-01-06 DIAGNOSIS — S2242XD Multiple fractures of ribs, left side, subsequent encounter for fracture with routine healing: Secondary | ICD-10-CM

## 2023-01-06 DIAGNOSIS — M79604 Pain in right leg: Secondary | ICD-10-CM

## 2023-01-06 MED ORDER — OXYCODONE-ACETAMINOPHEN 5-325 MG PO TABS
1.0000 | ORAL_TABLET | Freq: Four times a day (QID) | ORAL | 0 refills | Status: DC | PRN
Start: 2023-01-06 — End: 2023-03-03

## 2023-01-06 NOTE — Progress Notes (Signed)
Subjective:  Patient ID: Bradley Morales, male    DOB: 20-Mar-1980  Age: 42 y.o. MRN: [id number]  CC:  Chief Complaint  Patient presents with   Suture / Staple Removal    Pt was trampled by a bull, went ED and got stitches, was told to do follow up with PCP, has fractured ribs Saturday 01/01/2023    HPI Bradley Morales presents for   ER follow-up. Notes reviewed from [facility], [place] emergency department, 01/01/2023.  Evaluated for blunt trauma.  Trampled that afternoon by a bull, but charged towards him, was hit and then stepped on by a bull.  Stepped on right leg injuries to right ear, left anterior ribs.  No LOC.  Tdap in 2022. CTA right lower extremity, no evidence of vascular injury.  Stranding in the anterior and posterior subcutaneous soft tissue below the knee but no underlying fracture or dislocation and no joint effusion.  Soft tissue contusions. CT chest, pelvis indicated nondisplaced fractures of the left anterior fourth and fifth ribs but no additional acute findings. CT cervical spine negative for fracture or other acute process CT head negative. Tib-fib x-ray of right with no acute fracture. Multiple abrasions were noted, laceration to head and right lower extremities.  Treated with fentanyl and Zofran. Did have a syncopal episode in the ER, likely vasovagal. No sign of compartment syndrome. Treated with oxycodone outpatient as needed for pain. Laceration repair to right ear, 2.2 cm wound.  #4 simple interrupted sutures.  To have removed in 5 to 6 days.  Since ER visit Took oxycodone first few days scheduled, now at night only.  Ibuprofen every 6-7 hours.  Certain movements cause rib soreness. No dyspnea or new cough.  Itching of ear, healing. No d/c or bleeding. No hearing changes. R leg still hurting. Having tingling/numbness form kneecap to calf. Min tingling in toes. No numbness or pain in toes. Some tingling in toes.  Has waterbed - sleeping in recliner - difficult  to elevate leg.  Walking ok - no weakness.   Controlled substance database reviewed, #10 oxycodone prescribed on 01/02/2023.  No concerns   History Patient Active Problem List   Diagnosis Date Noted   Obesity due to excess calories    Snoring    HTN (hypertension) 08/30/2012   Palpitations 04/06/2012   OSA (obstructive sleep apnea) 03/06/2012   Nicotine addiction 11/05/2011   BMI 45.0-49.9, adult (HCC) 11/05/2011   Past Medical History:  Diagnosis Date   Bell's palsy    Hypertension    Obesity due to excess calories    Snoring disorder    Past Surgical History:  Procedure Laterality Date   CYST EXCISION N/A 09/30/2017   Procedure: EXCISION OF POSTERIOR NECK CYST AND LOWER BACK CYST;  Surgeon: Axel Filler, MD;  Location: [facility] OR;  Service: General;  Laterality: N/A;   VASECTOMY     WISDOM TOOTH EXTRACTION     No Known Allergies Prior to Admission medications   Medication Sig Start Date End Date Taking? Authorizing Provider  ibuprofen (ADVIL) 600 MG tablet Take 600 mg by mouth every 4 (four) hours as needed. 01/02/23 01/09/23 Yes [provider]  lisinopril (ZESTRIL) 10 MG tablet Take 1 tablet (10 mg total) by mouth daily. 02/25/22  Yes Shade Flood, MD  oxyCODONE-acetaminophen (PERCOCET/ROXICET) 5-325 MG tablet Take 1 tablet by mouth every 6 (six) hours as needed. 01/02/23 01/07/23 Yes [provider]   Social History   Socioeconomic History   Marital  status: Married    Spouse name: Melissa   Number of children: 1   Years of education: 12   Highest education level: 12th grade  Occupational History   Occupation: Sports administrator: [employer]   Occupation: Sports administrator: [employer]  Tobacco Use   Smoking status: Every Day    Current packs/day: 1.00    Average packs/day: 1 pack/day for 10.0 years (10.0 ttl pk-yrs)    Types: Cigarettes   Smokeless tobacco: Never   Tobacco comments:    Debating states "Probably  need to"  Vaping Use   Vaping status: Former  Substance and Sexual Activity   Alcohol use: Yes    Comment: rarely   Drug use: No   Sexual activity: Yes    Birth control/protection: None, Surgical    Comment: 1 partner in last 12 months  Other Topics Concern   Not on file  Social History Narrative   Patient is married ([place]) and lives at home with his family.   Patient has one child.   Patient is working full-time.   Patient has a high school education.   Patient is right-handed.   Patient drinks 3 cups of soda per day.   Social Drivers of Corporate investment banker Strain: Low Risk  (01/03/2023)   Overall Financial Resource Strain (CARDIA)    Difficulty of Paying Living Expenses: Not very hard  Food Insecurity: No Food Insecurity (01/03/2023)   Hunger Vital Sign    Worried About Running Out of Food in the Last Year: Never true    Ran Out of Food in the Last Year: Never true  Transportation Needs: No Transportation Needs (01/03/2023)   PRAPARE - Administrator, Civil Service (Medical): No    Lack of Transportation (Non-Medical): No  Physical Activity: Insufficiently Active (01/03/2023)   Exercise Vital Sign    Days of Exercise per Week: 2 days    Minutes of Exercise per Session: 10 min  Stress: No Stress Concern Present (01/03/2023)   [employer] of Occupational Health - Occupational Stress Questionnaire    Feeling of Stress : Not at all  Social Connections: Moderately Integrated (01/03/2023)   Social Connection and Isolation Panel [NHANES]    Frequency of Communication with Friends and Family: More than three times a week    Frequency of Social Gatherings with Friends and Family: Three times a week    Attends Religious Services: 1 to 4 times per year    Active Member of Clubs or Organizations: No    Attends Banker Meetings: Not on file    Marital Status: Married  Intimate Partner Violence: Unknown (04/23/2021)   Received from [employer]   HITS    Physically Hurt: Not on file    Insult or Talk Down To: Not on file    Threaten Physical Harm: Not on file    Scream or Curse: Not on file    Review of Systems   Objective:   Vitals:   01/06/23 1012  BP: 128/80  Pulse: 80  Temp: 98 F (36.7 C)  TempSrc: Temporal  SpO2: 99%  Weight: 297 lb 12.8 oz (135.1 kg)  Height: 5\' 11"  (1.803 m)     Physical Exam Vitals reviewed.  Constitutional:      Appearance: He is well-developed.  HENT:     Head: Normocephalic and atraumatic.  Neck:     Vascular: No carotid bruit  or JVD.  Cardiovascular:     Rate and Rhythm: Normal rate and regular rhythm.     Heart sounds: Normal heart sounds. No murmur heard. Pulmonary:     Effort: Pulmonary effort is normal.     Breath sounds: Normal breath sounds. No rales.  Musculoskeletal:     Right lower leg: No edema.     Left lower leg: No edema.     Comments: Tender to palpation over left anterior mid rib margin approximately midclavicular line, no subcu emphysema, no ecchymosis or skin changes.  Skin intact.  Small area of ecchymosis on the lower right flank but no focal tenderness.  Faint ecchymosis at the thenar eminence on the right hand but no bony tenderness, full range of motion of the wrist and hand.  Full strength and function of phalanges.  Neurovascular intact distally to fingertips. Right leg, intact knee extension, flexion, no focal bony tenderness.  Strength intact with resisted knee extension and flexion as well as dorsiflexion and plantarflexion of foot.  Sensation intact into the toes with cap refill less than 1 second.  Temperature of foot and toes equal right versus left foot.  difficulty obtaining DP pulse but unable to obtain on left side as well.  Multiple areas of ecchymosis posterior leg, with abrasion across the front leg without sign of surrounding infection.  See photos.  Skin:    General: Skin is warm and dry.  Neurological:     Mental  Status: He is alert and oriented to person, place, and time.  Psychiatric:        Mood and Affect: Mood normal.   #4 simple interrupted sutures removed without difficulty.  Center of wound with small amount of superficial eschar, appears to have slight widening with tension.  Steri-Strip applied centrally, aftercare discussed.  48 minutes spent during visit, including chart review, ER note reviewed, discussion of various injuries as above and aftercare, removal of sutures, counseling and assimilation of information, exam, discussion of plan, and chart completion.         Assessment & Plan:  Bradley Morales is a 42 y.o. male . Accidental physical contact with animal, subsequent encounter Closed fracture of multiple ribs of left side with routine healing, subsequent encounter  -Pain currently controlled with decreasing need for oxycodone, temporary refill provided for now.  Transition to over-the-counter meds as tolerated with RTC precautions.  Lungs clear with equal breath sounds, no new cough.  RTC/ER precautions given.  Laceration of right ear, subsequent encounter  -#4 simple interrupted sutures removed as above.  Wound adherent unless pressure pulling on central aspect.  Steri-Strip placed with x-rays given to patient if that starts to peel off.  Recommended trying to leave Steri-Strip in place for 3-4 more days.  Avoid scrubbing area or pulling on area. RTC precautions given.  Wound care discussed.  Crush injury knee/lower leg, right, subsequent encounter - Plan: oxyCODONE-acetaminophen (PERCOCET/ROXICET) 5-325 MG tablet, Ambulatory referral to Orthopedic Surgery Right leg pain - Plan: oxyCODONE-acetaminophen (PERCOCET/ROXICET) 5-325 MG tablet, Ambulatory referral to Orthopedic Surgery  --Injury as above.  Cap refill less than 1 second no apparent sign of compartment syndrome at this time but we discussed potential symptoms and need for ER evaluation if those were to occur.  Continued pain  within the posterior thigh, anterior lower leg without focal bony tenderness and previous imaging was reassuring including vascular imaging.  Will refer to orthopedics for continued follow-up and decision on further testing versus continued symptomatic care.  Needs flu shot - Plan: Flu vaccine trivalent PF, 6mos and older(Flulaval,Afluria,Fluarix,Fluzone)   Meds ordered this encounter  Medications   oxyCODONE-acetaminophen (PERCOCET/ROXICET) 5-325 MG tablet    Sig: Take 1 tablet by mouth every 6 (six) hours as needed for moderate pain (pain score 4-6).    Dispense:  10 tablet    Refill:  0   Patient Instructions  At this point you can continue to clean the abrasions with soap and water, watch for any new redness from around those areas or pus.  I will also refer you to orthopedics to follow-up on the leg injuries but overall the exam is reassuring today as well as the imaging in the ER. If you notice any new numbness or worsening tingling of the foot, pale color to the toes, change in color of the toes, or other acute changes be seen in the ER as we discussed.  Okay to continue oxycodone at night for the rib fractures, expect those to improve and should be able to transition over to over-the-counter medicine soon.  Sutures were removed today, most of the wound appears to be, together well, the central part may need few more days to completely close, keep the Steri-Strip intact and replaced that if it starts to loosen.  Avoid any pressure or pulling of that skin.  Water can run over the area but do not scrub it.  If any new or worsening symptoms including new bleeding, discharge or pain, be seen.  Hang in there!    Signed,   Meredith Staggers, MD [facility], [facility] 01/06/23 11:13 AM

## 2023-01-06 NOTE — Patient Instructions (Addendum)
At this point you can continue to clean the abrasions with soap and water, watch for any new redness from around those areas or pus.  I will also refer you to orthopedics to follow-up on the leg injuries but overall the exam is reassuring today as well as the imaging in the ER. If you notice any new numbness or worsening tingling of the foot, pale color to the toes, change in color of the toes, or other acute changes be seen in the ER as we discussed.  Okay to continue oxycodone at night for the rib fractures, expect those to improve and should be able to transition over to over-the-counter medicine soon.  Sutures were removed today, most of the wound appears to be, together well, the central part may need few more days to completely close, keep the Steri-Strip intact and replaced that if it starts to loosen.  Avoid any pressure or pulling of that skin.  Water can run over the area but do not scrub it.  If any new or worsening symptoms including new bleeding, discharge or pain, be seen.  Hang in there!

## 2023-01-07 ENCOUNTER — Encounter: Payer: Self-pay | Admitting: Physician Assistant

## 2023-01-07 ENCOUNTER — Ambulatory Visit: Payer: 59 | Admitting: Physician Assistant

## 2023-01-07 DIAGNOSIS — S8781XA Crushing injury of right lower leg, initial encounter: Secondary | ICD-10-CM | POA: Diagnosis not present

## 2023-01-07 NOTE — Progress Notes (Signed)
Office Visit Note   Patient: Bradley Morales           Date of Birth: [birth year]           MRN: [id number] Visit Date: 01/07/2023              Requested by: Shade Flood, MD [address] [place],  [place] [address] PCP: Shade Flood, MD   Assessment & Plan: Visit Diagnoses:  1. 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  He was fully evaluated the emergency room where he had a CTA which did not demonstrate any vascular compromise to the right lower extremity.  Also had findings consistent with fourth and fifth anterior rib fractures.  He is actually doing fairly well.  He is here to be evaluated for his right lower leg.  He has been trying to elevate it is much as he can.  On exam today he has no signs of compartment syndrome no signs of cellulitis or infection he has 2 healing superficial lacerations 1 on the anterior tibia and 1 posterior.  Again no drainage no sign of infection.  His compartments are compressible he has active dorsiflexion plantarflexion eversion inversion of his ankle.  We will treat this symptomatically he does have some perceived numbness over the anterior tibia.  I suspect this is secondary to swelling.  Should elevate ice would like to follow-up in 3 weeks.  He understands if he had any significant increase in pain in his leg redness or erythema he is to seek emergency care.  With regard to his rib fractures he is not short of breath he today had these should heal uneventfully encourage deep breathing to avoid any atelectasis  Follow-Up Instructions: Return in about 3 weeks (around 01/28/2023).   Orders:  No orders of the defined types were placed in this encounter.  No orders of the defined types were placed in this encounter.     Procedures: No procedures performed   Clinical Data: No additional  findings.   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  He was evaluated with CT scans and no evidence of any right lower extremity fracture but comes in today to follow-up on the swelling in his right lower leg.  Denies any significant pain has been trying to elevate it to the best of his ability also has a history of rib fractures no shortness of breath  Review of Systems  All other systems reviewed and are negative.    Objective: Vital Signs: There were no vitals taken for this visit.  Physical Exam Constitutional:      Appearance: Normal appearance.  Skin:    General: Skin is warm and dry.  Neurological:     General: No focal deficit present.     Mental Status: He is alert and oriented to person, place, and time.  Psychiatric:        Mood and Affect: Mood normal.        Behavior: Behavior normal.     Ortho Exam Examination today right lower extremity strong pulse has active dorsiflexion plantarflexion eversion and inversion can extend and flex his leg without difficulty has  some paresthesias perceived over the anterior lateral tibia negative Homans' sign compartments are compressible and nontender Specialty Comments:  No specialty comments available.  Imaging: No results found.   PMFS History: Patient Active Problem List   Diagnosis Date Noted   Crushing injury of right leg 01/07/2023   Obesity due to excess calories    Snoring    HTN (hypertension) 08/30/2012   Palpitations 04/06/2012   OSA (obstructive sleep apnea) 03/06/2012   Nicotine addiction 11/05/2011   BMI 45.0-49.9, adult (HCC) 11/05/2011   Past Medical History:  Diagnosis Date   Bell's palsy    Hypertension    Obesity due to excess calories    Snoring disorder     Family History  Problem Relation Age of Onset   Cancer Mother    Hypertension Mother     Uterine cancer Mother    Hypertension Father    Diabetes Father    Cancer Maternal Grandmother     Past Surgical History:  Procedure Laterality Date   CYST EXCISION N/A 09/30/2017   Procedure: EXCISION OF POSTERIOR NECK CYST AND LOWER BACK CYST;  Surgeon: Axel Filler, MD;  Location: [facility] OR;  Service: General;  Laterality: N/A;   VASECTOMY     WISDOM TOOTH EXTRACTION     Social History   Occupational History   Occupation: Sports administrator: [employer]   Occupation: Sports administrator: [employer]  Tobacco Use   Smoking status: Every Day    Current packs/day: 1.00    Average packs/day: 1 pack/day for 10.0 years (10.0 ttl pk-yrs)    Types: Cigarettes   Smokeless tobacco: Never   Tobacco comments:    Debating states "Probably need to"  Vaping Use   Vaping status: Former  Substance and Sexual Activity   Alcohol use: Yes    Comment: rarely   Drug use: No   Sexual activity: Yes    Birth control/protection: None, Surgical    Comment: 1 partner in last 12 months

## 2023-01-07 NOTE — Progress Notes (Signed)
   Office Visit Note   Patient: Bradley Morales           Date of Birth: May 13, 1980           MRN: [id number] Visit Date: 01/07/2023              Requested by: Shade Flood, MD [address] [place],  [place] [address] PCP: Shade Flood, MD   Assessment & Plan: Visit Diagnoses: No diagnosis found.  Plan:   Follow-Up Instructions: No follow-ups on file.   Orders:  No orders of the defined types were placed in this encounter.  No orders of the defined types were placed in this encounter.     Procedures: No procedures performed   Clinical Data: No additional findings.   Subjective: Chief Complaint  Patient presents with   Right Leg - Injury    HPI  Review of Systems   Objective: Vital Signs: There were no vitals taken for this visit.  Physical Exam  Ortho Exam  Specialty Comments:  No specialty comments available.  Imaging: No results found.   PMFS History: Patient Active Problem List   Diagnosis Date Noted   Obesity due to excess calories    Snoring    HTN (hypertension) 08/30/2012   Palpitations 04/06/2012   OSA (obstructive sleep apnea) 03/06/2012   Nicotine addiction 11/05/2011   BMI 45.0-49.9, adult (HCC) 11/05/2011   Past Medical History:  Diagnosis Date   Bell's palsy    Hypertension    Obesity due to excess calories    Snoring disorder     Family History  Problem Relation Age of Onset   Cancer Mother    Hypertension Mother    Uterine cancer Mother    Hypertension Father    Diabetes Father    Cancer Maternal Grandmother     Past Surgical History:  Procedure Laterality Date   CYST EXCISION N/A 09/30/2017   Procedure: EXCISION OF POSTERIOR NECK CYST AND LOWER BACK CYST;  Surgeon: Axel Filler, MD;  Location: [facility] OR;  Service: General;  Laterality: N/A;   VASECTOMY     WISDOM TOOTH EXTRACTION     Social History   Occupational History   Occupation: Sports administrator: [employer]   Occupation:  Sports administrator: [employer]  Tobacco Use   Smoking status: Every Day    Current packs/day: 1.00    Average packs/day: 1 pack/day for 10.0 years (10.0 ttl pk-yrs)    Types: Cigarettes   Smokeless tobacco: Never   Tobacco comments:    Debating states "Probably need to"  Vaping Use   Vaping status: Former  Substance and Sexual Activity   Alcohol use: Yes    Comment: rarely   Drug use: No   Sexual activity: Yes    Birth control/protection: None, Surgical    Comment: 1 partner in last 12 months

## 2023-01-28 ENCOUNTER — Encounter: Payer: Self-pay | Admitting: Physician Assistant

## 2023-01-28 ENCOUNTER — Ambulatory Visit: Payer: 59 | Admitting: Physician Assistant

## 2023-01-28 DIAGNOSIS — S8781XD Crushing injury of right lower leg, subsequent encounter: Secondary | ICD-10-CM

## 2023-01-28 NOTE — Progress Notes (Signed)
 Office Visit Note   Patient: Bradley Morales           Date of Birth: 1980/07/20           MRN: [id number] Visit Date: 01/28/2023              Requested by: Levora Reyes SAUNDERS, MD [address] [place],  [place] [address] PCP: Levora Reyes SAUNDERS, MD  No chief complaint on file.     HPI: Patient is a pleasant 43 year old gentleman who is now almost a month status post right lower leg crushing injury secondary to a rodeo injury with a ball.  He reports he is doing much better has no complaints denies any fever chills  Assessment & Plan: Visit Diagnoses:  1. Crushing injury of right lower extremity, subsequent encounter     Plan: Calf is soft and supple he has a negative [place]' sign he has a laceration on the anterior aspect of his shin which is healing with eschar quite nicely no surrounding erythema or signs of cellulitis can continue to treat this symptomatically may follow-up as needed if he has any concerns  Follow-Up Instructions: Return if symptoms worsen or fail to improve.   Ortho Exam  Patient is alert, oriented, no adenopathy, well-dressed, normal affect, normal respiratory effort. Examination of his right lower extremity he has a healing laceration of the anterior part of his lower leg has good eschar without any drainage no surrounding erythema.  His calf is soft and supple he has good active dorsiflexion and plantarflexion of his ankle.  He has a negative Homans' sign.  No signs of cellulitis  Imaging: No results found. No images are attached to the encounter.  Labs: Lab Results  Component Value Date   HGBA1C 5.6 02/25/2022   HGBA1C 5.6 02/13/2021   HGBA1C 5.4 06/22/2019     Lab Results  Component Value Date   ALBUMIN 4.6 02/25/2022   ALBUMIN 4.4 08/20/2021   ALBUMIN 4.5 02/13/2021    No results found for: MG No results found for: VD25OH  No results found for: PREALBUMIN    Latest Ref Rng & Units 07/10/2022    5:21 AM 02/25/2022    8:40 AM  02/13/2021    8:47 AM  CBC EXTENDED  WBC 4.0 - 10.5 K/uL 9.3  8.6  7.0   RBC 4.22 - 5.81 MIL/uL 5.85  5.86  5.67   Hemoglobin 13.0 - 17.0 g/dL 83.3  82.7  83.8   HCT 39.0 - 52.0 % 51.2  51.2  48.7   Platelets 150 - 400 K/uL 237  231.0  199.0   NEUT# 1.4 - 7.7 K/uL  5.3  4.1   Lymph# 0.7 - 4.0 K/uL  2.4  2.0      There is no height or weight on file to calculate BMI.  Orders:  No orders of the defined types were placed in this encounter.  No orders of the defined types were placed in this encounter.    Procedures: No procedures performed  Clinical Data: No additional findings.  ROS:  All other systems negative, except as noted in the HPI. Review of Systems  Objective: Vital Signs: There were no vitals taken for this visit.  Specialty Comments:  No specialty comments available.  PMFS History: Patient Active Problem List   Diagnosis Date Noted   Crushing injury of right leg 01/07/2023   Obesity due to excess calories    Snoring    HTN (hypertension)  08/30/2012   Palpitations 04/06/2012   OSA (obstructive sleep apnea) 03/06/2012   Nicotine addiction 11/05/2011   BMI 45.0-49.9, adult (HCC) 11/05/2011   Past Medical History:  Diagnosis Date   Bell's palsy    Hypertension    Obesity due to excess calories    Snoring disorder     Family History  Problem Relation Age of Onset   Cancer Mother    Hypertension Mother    Uterine cancer Mother    Hypertension Father    Diabetes Father    Cancer Maternal Grandmother     Past Surgical History:  Procedure Laterality Date   CYST EXCISION N/A 09/30/2017   Procedure: EXCISION OF POSTERIOR NECK CYST AND LOWER BACK CYST;  Surgeon: Rubin Calamity, MD;  Location: [facility] OR;  Service: General;  Laterality: N/A;   VASECTOMY     WISDOM TOOTH EXTRACTION     Social History   Occupational History   Occupation: Sports Administrator: [employer]   Occupation: Sports Administrator: [employer]  Tobacco Use    Smoking status: Every Day    Current packs/day: 1.00    Average packs/day: 1 pack/day for 10.0 years (10.0 ttl pk-yrs)    Types: Cigarettes   Smokeless tobacco: Never   Tobacco comments:    Debating states Probably need to  Vaping Use   Vaping status: Former  Substance and Sexual Activity   Alcohol use: Yes    Comment: rarely   Drug use: No   Sexual activity: Yes    Birth control/protection: None, Surgical    Comment: 1 partner in last 12 months

## 2023-03-01 ENCOUNTER — Other Ambulatory Visit: Payer: Self-pay | Admitting: Family Medicine

## 2023-03-01 DIAGNOSIS — Z0000 Encounter for general adult medical examination without abnormal findings: Secondary | ICD-10-CM

## 2023-03-01 DIAGNOSIS — I10 Essential (primary) hypertension: Secondary | ICD-10-CM

## 2023-03-03 ENCOUNTER — Encounter: Payer: Self-pay | Admitting: Family Medicine

## 2023-03-03 ENCOUNTER — Ambulatory Visit (INDEPENDENT_AMBULATORY_CARE_PROVIDER_SITE_OTHER): Payer: 59 | Admitting: Family Medicine

## 2023-03-03 VITALS — BP 114/72 | HR 86 | Temp 98.4°F | Ht 70.0 in | Wt 285.2 lb

## 2023-03-03 DIAGNOSIS — I10 Essential (primary) hypertension: Secondary | ICD-10-CM | POA: Diagnosis not present

## 2023-03-03 DIAGNOSIS — Z130 Encounter for screening for diseases of the blood and blood-forming organs and certain disorders involving the immune mechanism: Secondary | ICD-10-CM | POA: Diagnosis not present

## 2023-03-03 DIAGNOSIS — F17210 Nicotine dependence, cigarettes, uncomplicated: Secondary | ICD-10-CM | POA: Diagnosis not present

## 2023-03-03 DIAGNOSIS — Z0000 Encounter for general adult medical examination without abnormal findings: Secondary | ICD-10-CM

## 2023-03-03 DIAGNOSIS — E785 Hyperlipidemia, unspecified: Secondary | ICD-10-CM

## 2023-03-03 DIAGNOSIS — Z131 Encounter for screening for diabetes mellitus: Secondary | ICD-10-CM | POA: Diagnosis not present

## 2023-03-03 LAB — LIPID PANEL
Cholesterol: 170 mg/dL (ref 0–200)
HDL: 33 mg/dL — ABNORMAL LOW (ref >39.00)
LDL Cholesterol: 115 mg/dL — ABNORMAL HIGH (ref 0–99)
NonHDL: 137.1
Total CHOL/HDL Ratio: 5
Triglycerides: 112 mg/dL (ref 0.0–149.0)
VLDL: 22.4 mg/dL (ref 0.0–40.0)

## 2023-03-03 LAB — COMPREHENSIVE METABOLIC PANEL
ALT: 19 U/L (ref 0–53)
AST: 22 U/L (ref 0–37)
Albumin: 4.6 g/dL (ref 3.5–5.2)
Alkaline Phosphatase: 88 U/L (ref 39–117)
BUN: 13 mg/dL (ref 6–23)
CO2: 30 meq/L (ref 19–32)
Calcium: 9.9 mg/dL (ref 8.4–10.5)
Chloride: 102 meq/L (ref 96–112)
Creatinine, Ser: 1.09 mg/dL (ref 0.40–1.50)
GFR: 84.79 mL/min (ref >60.00)
Glucose, Bld: 85 mg/dL (ref 70–99)
Potassium: 5.3 meq/L — ABNORMAL HIGH (ref 3.5–5.1)
Sodium: 138 meq/L (ref 135–145)
Total Bilirubin: 0.7 mg/dL (ref 0.2–1.2)
Total Protein: 7.8 g/dL (ref 6.0–8.3)

## 2023-03-03 LAB — CBC
HCT: 51.9 % (ref 39.0–52.0)
Hemoglobin: 17 g/dL (ref 13.0–17.0)
MCHC: 32.7 g/dL (ref 30.0–36.0)
MCV: 87.4 fL (ref 78.0–100.0)
Platelets: 249 10*3/uL (ref 150.0–400.0)
RBC: 5.94 Mil/uL — ABNORMAL HIGH (ref 4.22–5.81)
RDW: 13 % (ref 11.5–15.5)
WBC: 7.7 10*3/uL (ref 4.0–10.5)

## 2023-03-03 LAB — HEMOGLOBIN A1C: Hgb A1c MFr Bld: 5.6 % (ref 4.6–6.5)

## 2023-03-03 MED ORDER — LISINOPRIL 10 MG PO TABS
10.0000 mg | ORAL_TABLET | Freq: Every day | ORAL | 3 refills | Status: AC
Start: 2023-03-03 — End: ?

## 2023-03-03 NOTE — Progress Notes (Signed)
Subjective:  Patient ID: Bradley C Wheeland, male    DOB: December 26, 1980  Age: 43 y.o. MRN: [id number]  CC:  Chief Complaint  Patient presents with   Annual Exam    Pt is here for annual exam Pt reports no concerns Pt is FASTING    HPI Bradley Morales presents for Annual Exam  Last visit in December after multiple injuries, ED visit from blunt trauma after being trampled by a bull.  Ear laceration with sutures removed at that time, rib fractures with oxycodone, plan for transition to over-the-counter meds.  Crush injury/contusion of leg with hematoma.  Referred to orthopedics. Improving, still some nerve symptoms on outside of leg. Ribs are ok. No pain meds needed.   Nicotine addiction 1 pack/day for approximately 20 years.  Not ready to quit when discussed at his last visit, still 1 pack/day.  He was planning on vaping to try to assist with quitting cigarettes. Both vaping and 3/4 ppd. declines meds for cessation at this time.   Hypertension: Lisinopril 10 mg daily.  Also with obstructive sleep apnea, did not tolerate CPAP.  Avoid supine sleeping position. Wakes up rested.  Home readings: controlled on home readings, unknown exact readings.  BP Readings from Last 3 Encounters:  03/03/23 114/72  01/06/23 128/80  09/23/22 138/80   Lab Results  Component Value Date   CREATININE 1.24 07/10/2022   Lab Results  Component Value Date   CHOL 196 02/25/2022   HDL 31.40 (L) 02/25/2022   LDLCALC 137 (H) 02/25/2022   TRIG 139.0 02/25/2022   CHOLHDL 6 02/25/2022    Obesity Weight has improved from December reading, but up from September. Usually loses weight during summer.  Wt Readings from Last 3 Encounters:  03/03/23 285 lb 4 oz (129.4 kg)  01/06/23 297 lb 12.8 oz (135.1 kg)  09/23/22 278 lb 9.6 oz (126.4 kg)   Body mass index is 40.93 kg/m. Borderline A1c of 5.6 last year.      01/06/2023   10:14 AM 02/25/2022    8:00 AM 08/20/2021    8:37 AM 02/13/2021    7:59 AM 08/07/2020    8:44 AM   Depression screen PHQ 2/9  Decreased Interest 0 0 0 0 0  Down, Depressed, Hopeless 0 0 0 0 0  PHQ - 2 Score 0 0 0 0 0  Altered sleeping 0   0   Tired, decreased energy 0   0   Change in appetite 0   0   Feeling bad or failure about yourself  0   0   Trouble concentrating 0   0   Moving slowly or fidgety/restless 0   0   Suicidal thoughts 0   0   PHQ-9 Score 0   0     Health Maintenance  Topic Date Due   Pneumococcal Vaccine 66-35 Years old (1 of 2 - PCV) Never done   COVID-19 Vaccine (4 - 2024-25 season) 09/19/2022   DTaP/Tdap/Td (3 - Td or Tdap) 01/30/2030   INFLUENZA VACCINE  Completed   Hepatitis C Screening  Completed   HIV Screening  Completed   HPV VACCINES  Aged Out  FH of CA - uterine, mom. On chemo. Doing ok.  No FH of colon CA Prostate: does not have family history of prostate cancer The natural history of prostate cancer and ongoing controversy regarding screening and potential treatment outcomes of prostate cancer has been discussed with the patient. The meaning of a false positive  PSA and a false negative PSA has been discussed. He indicates understanding of the limitations of this screening test and wishes not. to proceed with screening PSA testing. No results found for: "PSA1", "PSA"  Immunization History  Administered Date(s) Administered   Influenza Split 11/03/2011, 09/05/2012   Influenza, Seasonal, Injecte, Preservative Fre 01/06/2023   Influenza,inj,Quad PF,6+ Mos 12/20/2018, 02/13/2021, 02/25/2022   Influenza-Unspecified 11/07/2013, 11/14/2014, 12/05/2019   Moderna Sars-Covid-2 Vaccination 04/26/2019, 05/24/2019, 12/25/2019   Td 01/31/2020   Tdap 01/18/2010  Covid booster - declines.   No results found. Optho - within past year. Few months ago. R eye changed rx - glasses.   Dental: every 6 months.   Alcohol: rare - few per month or less.   Tobacco: as above  Exercise: physical work, Presenter, broadcasting.   Declines sti screening.   History Patient  Active Problem List   Diagnosis Date Noted   Crushing injury of right leg 01/07/2023   Obesity due to excess calories    Snoring    HTN (hypertension) 08/30/2012   Palpitations 04/06/2012   Nicotine addiction 11/05/2011   BMI 45.0-49.9, adult (HCC) 11/05/2011   Past Medical History:  Diagnosis Date   Bell's palsy    Hypertension    Obesity due to excess calories    Snoring disorder    Past Surgical History:  Procedure Laterality Date   CYST EXCISION N/A 09/30/2017   Procedure: EXCISION OF POSTERIOR NECK CYST AND LOWER BACK CYST;  Surgeon: Axel Filler, MD;  Location: [facility] OR;  Service: General;  Laterality: N/A;   VASECTOMY     WISDOM TOOTH EXTRACTION     No Known Allergies Prior to Admission medications   Medication Sig Start Date End Date Taking? Authorizing Provider  lisinopril (ZESTRIL) 10 MG tablet Take 1 tablet by mouth once daily 03/01/23  Yes Shade Flood, MD   Social History   Socioeconomic History   Marital status: Married    Spouse name: Melissa   Number of children: 1   Years of education: 12   Highest education level: 12th grade  Occupational History   Occupation: Sports administrator: [employer]   Occupation: Sports administrator: [employer]  Tobacco Use   Smoking status: Every Day    Current packs/day: 1.00    Average packs/day: 1 pack/day for 10.0 years (10.0 ttl pk-yrs)    Types: Cigarettes   Smokeless tobacco: Never   Tobacco comments:    Debating states "Probably need to"  Vaping Use   Vaping status: Former  Substance and Sexual Activity   Alcohol use: Yes    Comment: rarely   Drug use: No   Sexual activity: Yes    Birth control/protection: None, Surgical    Comment: 1 partner in last 12 months  Other Topics Concern   Not on file  Social History Narrative   Patient is married ([place]) and lives at home with his family.   Patient has one child.   Patient is working full-time.   Patient has a high school  education.   Patient is right-handed.   Patient drinks 3 cups of soda per day.   Social Drivers of Corporate investment banker Strain: Low Risk  (01/03/2023)   Overall Financial Resource Strain (CARDIA)    Difficulty of Paying Living Expenses: Not very hard  Food Insecurity: No Food Insecurity (01/03/2023)   Hunger Vital Sign    Worried About Running Out of Food in the Last  Year: Never true    Ran Out of Food in the Last Year: Never true  Transportation Needs: No Transportation Needs (01/03/2023)   PRAPARE - Administrator, Civil Service (Medical): No    Lack of Transportation (Non-Medical): No  Physical Activity: Insufficiently Active (01/03/2023)   Exercise Vital Sign    Days of Exercise per Week: 2 days    Minutes of Exercise per Session: 10 min  Stress: No Stress Concern Present (01/03/2023)   [employer] of Occupational Health - Occupational Stress Questionnaire    Feeling of Stress : Not at all  Social Connections: Moderately Integrated (01/03/2023)   Social Connection and Isolation Panel [NHANES]    Frequency of Communication with Friends and Family: More than three times a week    Frequency of Social Gatherings with Friends and Family: Three times a week    Attends Religious Services: 1 to 4 times per year    Active Member of Clubs or Organizations: No    Attends Banker Meetings: Not on file    Marital Status: Married  Intimate Partner Violence: Unknown (04/23/2021)   Received from [facility], [facility]   HITS    Physically Hurt: Not on file    Insult or Talk Down To: Not on file    Threaten Physical Harm: Not on file    Scream or Curse: Not on file    Review of Systems 13 point review of systems per patient health survey noted.  Negative other than as indicated above or in HPI.    Objective:   Vitals:   03/03/23 0800  BP: 114/72  Pulse: 86  Temp: 98.4 F (36.9 C)  SpO2: 97%  Weight: 285 lb 4 oz (129.4 kg)  Height: 5'  10" (1.778 m)     Physical Exam Vitals reviewed.  Constitutional:      Appearance: He is well-developed.  HENT:     Head: Normocephalic and atraumatic.     Right Ear: External ear normal.     Left Ear: External ear normal.  Eyes:     Conjunctiva/sclera: Conjunctivae normal.     Pupils: Pupils are equal, round, and reactive to light.  Neck:     Thyroid: No thyromegaly.  Cardiovascular:     Rate and Rhythm: Normal rate and regular rhythm.     Heart sounds: Normal heart sounds.  Pulmonary:     Effort: Pulmonary effort is normal. No respiratory distress.     Breath sounds: Normal breath sounds. No wheezing.  Abdominal:     General: There is no distension.     Palpations: Abdomen is soft.     Tenderness: There is no abdominal tenderness.  Musculoskeletal:        General: No tenderness. Normal range of motion.     Cervical back: Normal range of motion and neck supple.  Lymphadenopathy:     Cervical: No cervical adenopathy.  Skin:    General: Skin is warm and dry.  Neurological:     Mental Status: He is alert and oriented to person, place, and time.     Deep Tendon Reflexes: Reflexes are normal and symmetric.  Psychiatric:        Behavior: Behavior normal.      Assessment & Plan:  Bradley Morales is a 43 y.o. male . Essential hypertension - Plan: lisinopril (ZESTRIL) 10 MG tablet  Annual physical exam - Plan: lisinopril (ZESTRIL) 10 MG tablet  -anticipatory guidance as below in AVS,  screening labs above. Health maintenance items as above in HPI discussed/recommended as applicable.    Meds ordered this encounter  Medications   lisinopril (ZESTRIL) 10 MG tablet    Sig: Take 1 tablet (10 mg total) by mouth daily.    Dispense:  90 tablet    Refill:  3   Patient Instructions  Continue to try to cut back on smoking.  Will check some labs today.  If any concerns I will let you know.  No change in meds for now.  Take care!  Preventive Care 84-51 Years Old, Male Preventive  care refers to lifestyle choices and visits with your health care provider that can promote health and wellness. Preventive care visits are also called wellness exams. What can I expect for my preventive care visit? Counseling During your preventive care visit, your health care provider may ask about your: Medical history, including: Past medical problems. Family medical history. Current health, including: Emotional well-being. Home life and relationship well-being. Sexual activity. Lifestyle, including: Alcohol, nicotine or tobacco, and drug use. Access to firearms. Diet, exercise, and sleep habits. Safety issues such as seatbelt and bike helmet use. Sunscreen use. Work and work Astronomer. Physical exam Your health care provider will check your: Height and weight. These may be used to calculate your BMI (body mass index). BMI is a measurement that tells if you are at a healthy weight. Waist circumference. This measures the distance around your waistline. This measurement also tells if you are at a healthy weight and may help predict your risk of certain diseases, such as type 2 diabetes and high blood pressure. Heart rate and blood pressure. Body temperature. Skin for abnormal spots. What immunizations do I need?  Vaccines are usually given at various ages, according to a schedule. Your health care provider will recommend vaccines for you based on your age, medical history, and lifestyle or other factors, such as travel or where you work. What tests do I need? Screening Your health care provider may recommend screening tests for certain conditions. This may include: Lipid and cholesterol levels. Diabetes screening. This is done by checking your blood sugar (glucose) after you have not eaten for a while (fasting). Hepatitis B test. Hepatitis C test. HIV (human immunodeficiency virus) test. STI (sexually transmitted infection) testing, if you are at risk. Lung cancer  screening. Prostate cancer screening. Colorectal cancer screening. Talk with your health care provider about your test results, treatment options, and if necessary, the need for more tests. Follow these instructions at home: Eating and drinking  Eat a diet that includes fresh fruits and vegetables, whole grains, lean protein, and low-fat dairy products. Take vitamin and mineral supplements as recommended by your health care provider. Do not drink alcohol if your health care provider tells you not to drink. If you drink alcohol: Limit how much you have to 0-2 drinks a day. Know how much alcohol is in your drink. In the [place] one drink equals one 12 oz bottle of beer (355 mL), one 5 oz glass of wine (148 mL), or one 1 oz glass of hard liquor (44 mL). Lifestyle Brush your teeth every morning and night with fluoride toothpaste. Floss one time each day. Exercise for at least 30 minutes 5 or more days each week. Do not use any products that contain nicotine or tobacco. These products include cigarettes, chewing tobacco, and vaping devices, such as e-cigarettes. If you need help quitting, ask your health care provider. Do not use drugs. If you  are sexually active, practice safe sex. Use a condom or other form of protection to prevent STIs. Take aspirin only as told by your health care provider. Make sure that you understand how much to take and what form to take. Work with your health care provider to find out whether it is safe and beneficial for you to take aspirin daily. Find healthy ways to manage stress, such as: Meditation, yoga, or listening to music. Journaling. Talking to a trusted person. Spending time with friends and family. Minimize exposure to UV radiation to reduce your risk of skin cancer. Safety Always wear your seat belt while driving or riding in a vehicle. Do not drive: If you have been drinking alcohol. Do not ride with someone who has been drinking. When you are tired or  distracted. While texting. If you have been using any mind-altering substances or drugs. Wear a helmet and other protective equipment during sports activities. If you have firearms in your house, make sure you follow all gun safety procedures. What's next? Go to your health care provider once a year for an annual wellness visit. Ask your health care provider how often you should have your eyes and teeth checked. Stay up to date on all vaccines. This information is not intended to replace advice given to you by your health care provider. Make sure you discuss any questions you have with your health care provider. Document Revised: 07/02/2020 Document Reviewed: 07/02/2020 Elsevier Patient Education  2024 Elsevier Inc.    Signed,   Meredith Staggers, MD [facility], [facility] 03/03/23 8:30 AM

## 2023-03-03 NOTE — Patient Instructions (Signed)
Continue to try to cut back on smoking.  Will check some labs today.  If any concerns I will let you know.  No change in meds for now.  Take care!  Preventive Care 79-41 Years Old, Male Preventive care refers to lifestyle choices and visits with your health care provider that can promote health and wellness. Preventive care visits are also called wellness exams. What can I expect for my preventive care visit? Counseling During your preventive care visit, your health care provider may ask about your: Medical history, including: Past medical problems. Family medical history. Current health, including: Emotional well-being. Home life and relationship well-being. Sexual activity. Lifestyle, including: Alcohol, nicotine or tobacco, and drug use. Access to firearms. Diet, exercise, and sleep habits. Safety issues such as seatbelt and bike helmet use. Sunscreen use. Work and work Astronomer. Physical exam Your health care provider will check your: Height and weight. These may be used to calculate your BMI (body mass index). BMI is a measurement that tells if you are at a healthy weight. Waist circumference. This measures the distance around your waistline. This measurement also tells if you are at a healthy weight and may help predict your risk of certain diseases, such as type 2 diabetes and high blood pressure. Heart rate and blood pressure. Body temperature. Skin for abnormal spots. What immunizations do I need?  Vaccines are usually given at various ages, according to a schedule. Your health care provider will recommend vaccines for you based on your age, medical history, and lifestyle or other factors, such as travel or where you work. What tests do I need? Screening Your health care provider may recommend screening tests for certain conditions. This may include: Lipid and cholesterol levels. Diabetes screening. This is done by checking your blood sugar (glucose) after you have not  eaten for a while (fasting). Hepatitis B test. Hepatitis C test. HIV (human immunodeficiency virus) test. STI (sexually transmitted infection) testing, if you are at risk. Lung cancer screening. Prostate cancer screening. Colorectal cancer screening. Talk with your health care provider about your test results, treatment options, and if necessary, the need for more tests. Follow these instructions at home: Eating and drinking  Eat a diet that includes fresh fruits and vegetables, whole grains, lean protein, and low-fat dairy products. Take vitamin and mineral supplements as recommended by your health care provider. Do not drink alcohol if your health care provider tells you not to drink. If you drink alcohol: Limit how much you have to 0-2 drinks a day. Know how much alcohol is in your drink. In the [place] one drink equals one 12 oz bottle of beer (355 mL), one 5 oz glass of wine (148 mL), or one 1 oz glass of hard liquor (44 mL). Lifestyle Brush your teeth every morning and night with fluoride toothpaste. Floss one time each day. Exercise for at least 30 minutes 5 or more days each week. Do not use any products that contain nicotine or tobacco. These products include cigarettes, chewing tobacco, and vaping devices, such as e-cigarettes. If you need help quitting, ask your health care provider. Do not use drugs. If you are sexually active, practice safe sex. Use a condom or other form of protection to prevent STIs. Take aspirin only as told by your health care provider. Make sure that you understand how much to take and what form to take. Work with your health care provider to find out whether it is safe and beneficial for you to take aspirin  daily. Find healthy ways to manage stress, such as: Meditation, yoga, or listening to music. Journaling. Talking to a trusted person. Spending time with friends and family. Minimize exposure to UV radiation to reduce your risk of skin  cancer. Safety Always wear your seat belt while driving or riding in a vehicle. Do not drive: If you have been drinking alcohol. Do not ride with someone who has been drinking. When you are tired or distracted. While texting. If you have been using any mind-altering substances or drugs. Wear a helmet and other protective equipment during sports activities. If you have firearms in your house, make sure you follow all gun safety procedures. What's next? Go to your health care provider once a year for an annual wellness visit. Ask your health care provider how often you should have your eyes and teeth checked. Stay up to date on all vaccines. This information is not intended to replace advice given to you by your health care provider. Make sure you discuss any questions you have with your health care provider. Document Revised: 07/02/2020 Document Reviewed: 07/02/2020 Elsevier Patient Education  2024 [employer].

## 2023-03-08 ENCOUNTER — Telehealth: Payer: Self-pay

## 2023-03-08 ENCOUNTER — Encounter: Payer: Self-pay | Admitting: Family Medicine

## 2023-03-08 DIAGNOSIS — E875 Hyperkalemia: Secondary | ICD-10-CM

## 2023-03-08 NOTE — Telephone Encounter (Signed)
 Potassium level was borderline elevated, similar readings in February of last year.  If you are taking any potassium supplements would stop those at this time.  Otherwise lets recheck those labs in the next week or 2 at a lab only visit.  I will have our staff schedule it.   Cholesterol levels were slightly improved from previous readings.  Continue to watch diet, exercise.  Blood counts were stable.  55-month blood sugar test at the higher end of normal but stable.  Let me know if you have questions.   Dr. Neva Seat

## 2023-03-08 NOTE — Telephone Encounter (Signed)
 Copied from CRM [id number]. Topic: Clinical - Request for Lab/Test Order >> Mar 08, 2023  3:28 PM Bradley Morales wrote: Reason for CRM: Patient called in returning a missed call. Per patients notes he needs to come back for a follow up lab draw, please order labs. Patient was scheduled for 03/25/2023 for a lab visit.

## 2023-03-08 NOTE — Telephone Encounter (Signed)
-----   Message from Shade Flood sent at 03/08/2023  2:01 PM EST ----- Results sent by MyChart, please schedule lab visit, BMP for hyperkalemia.

## 2023-03-09 NOTE — Telephone Encounter (Signed)
 Copied from CRM [id number]. Topic: Clinical - Request for Lab/Test Order >> Mar 08, 2023  3:28 PM Florestine Avers wrote: Reason for CRM: Patient called in returning a missed call. Per patients notes he needs to come back for a follow up lab draw, please order labs. Patient was scheduled for 03/25/2023 for a lab visit.    Labs have been ordered

## 2023-03-15 ENCOUNTER — Telehealth: Payer: Self-pay

## 2023-03-15 NOTE — Telephone Encounter (Signed)
 Lab results have been discussed.   Verbalized understanding? Yes  Are there any questions? No   Lab appointment made for 03/25/2023. Lab orders placed.

## 2023-03-15 NOTE — Telephone Encounter (Addendum)
 Called Pt to discuss lab results, Pt did not answer, Left VM to return Call. Looks like pt is scheduled on 03/25/2023 for a lab visit. Note shared by Thea Silversmith from 03/08/2023 looks like we just need to order the labs? Will verify with pt when he calls back if the lab results from 03/08/2023 were relayed to him.

## 2023-03-15 NOTE — Telephone Encounter (Signed)
 Copied from CRM [id number]. Topic: Clinical - Lab/Test Results >> Mar 15, 2023 11:42 AM Drema Balzarine wrote: Reason for CRM: Patient returning call regarding lab results, says he already knows about lab note from 2/18 but he received another call today 2/25.

## 2023-03-15 NOTE — Telephone Encounter (Signed)
-----   Message from Shade Flood sent at 03/15/2023 10:15 AM EST ----- Results sent by MyChart, but appears patient has not yet reviewed those results.  Please call and make sure they have either seen note or discuss result note.  Thanks.

## 2023-03-25 ENCOUNTER — Other Ambulatory Visit (INDEPENDENT_AMBULATORY_CARE_PROVIDER_SITE_OTHER): Payer: 59

## 2023-03-25 DIAGNOSIS — E875 Hyperkalemia: Secondary | ICD-10-CM | POA: Diagnosis not present

## 2023-03-25 LAB — COMPREHENSIVE METABOLIC PANEL
ALT: 20 U/L (ref 0–53)
AST: 19 U/L (ref 0–37)
Albumin: 4.2 g/dL (ref 3.5–5.2)
Alkaline Phosphatase: 90 U/L (ref 39–117)
BUN: 11 mg/dL (ref 6–23)
CO2: 29 meq/L (ref 19–32)
Calcium: 9.4 mg/dL (ref 8.4–10.5)
Chloride: 103 meq/L (ref 96–112)
Creatinine, Ser: 1.03 mg/dL (ref 0.40–1.50)
GFR: 90.71 mL/min (ref >60.00)
Glucose, Bld: 102 mg/dL — ABNORMAL HIGH (ref 70–99)
Potassium: 5.8 meq/L — ABNORMAL HIGH (ref 3.5–5.1)
Sodium: 139 meq/L (ref 135–145)
Total Bilirubin: 0.5 mg/dL (ref 0.2–1.2)
Total Protein: 7 g/dL (ref 6.0–8.3)

## 2023-03-30 ENCOUNTER — Telehealth: Payer: Self-pay

## 2023-03-30 DIAGNOSIS — E875 Hyperkalemia: Secondary | ICD-10-CM

## 2023-03-30 NOTE — Telephone Encounter (Signed)
 Lab results have been discussed.   Verbalized understanding? Yes  Are there any questions? No     Patient states he is not taking any potassium supplements, no muscle injury then the previous noted injury, Not taking any supplements at all.

## 2023-03-30 NOTE — Telephone Encounter (Signed)
-----   Message from Shade Flood sent at 03/29/2023  1:38 PM EDT ----- Call patient.  Potassium level higher than last reading, still elevated.  Is he taking any potassium supplements?  Any recent muscle injury, not counting the injury from a few months ago.  Any other over-the-counter supplements?

## 2023-03-31 ENCOUNTER — Other Ambulatory Visit (INDEPENDENT_AMBULATORY_CARE_PROVIDER_SITE_OTHER)

## 2023-03-31 DIAGNOSIS — E875 Hyperkalemia: Secondary | ICD-10-CM | POA: Diagnosis not present

## 2023-03-31 LAB — BASIC METABOLIC PANEL
BUN: 12 mg/dL (ref 6–23)
CO2: 26 meq/L (ref 19–32)
Calcium: 9.2 mg/dL (ref 8.4–10.5)
Chloride: 104 meq/L (ref 96–112)
Creatinine, Ser: 1.11 mg/dL (ref 0.40–1.50)
GFR: 82.92 mL/min (ref >60.00)
Glucose, Bld: 90 mg/dL (ref 70–99)
Potassium: 4.4 meq/L (ref 3.5–5.1)
Sodium: 137 meq/L (ref 135–145)

## 2023-03-31 NOTE — Telephone Encounter (Signed)
 Without any specific cause and if no symptoms, this could be pseudohyperkalemia or elevated potassium based on blood specimen draw.  I would like to have him come back either today or tomorrow for a lab only visit and I will discuss method of repeat draw with lab tech.  Let me know which day he will be able to come by and timing of that visit so I can discuss with Akera.  Thanks.

## 2023-03-31 NOTE — Telephone Encounter (Signed)
 Patient is coming in today and I have him placed for 9:45am.

## 2023-03-31 NOTE — Telephone Encounter (Signed)
 Patient here for lab visit, hyperkalemia possible pseudohyperkalemia.  Denies palpitations, fatigue, or other new symptoms.  He is not taking any potassium supplement or change in diet of potassium rich foods.  Suspected pseudohyperkalemia, will try venipuncture without tourniquet.  Repeat BMP ordered.  Discussed with lab tech.

## 2023-04-02 ENCOUNTER — Encounter: Payer: Self-pay | Admitting: Family Medicine

## 2023-08-19 ENCOUNTER — Ambulatory Visit: Admitting: Family Medicine

## 2023-08-19 ENCOUNTER — Encounter: Payer: Self-pay | Admitting: Family Medicine

## 2023-08-19 VITALS — BP 124/80 | HR 83 | Temp 98.6°F | Ht 70.0 in | Wt 284.2 lb

## 2023-08-19 DIAGNOSIS — Z720 Tobacco use: Secondary | ICD-10-CM | POA: Diagnosis not present

## 2023-08-19 DIAGNOSIS — R7309 Other abnormal glucose: Secondary | ICD-10-CM | POA: Diagnosis not present

## 2023-08-19 DIAGNOSIS — I10 Essential (primary) hypertension: Secondary | ICD-10-CM

## 2023-08-19 DIAGNOSIS — Z6841 Body Mass Index (BMI) 40.0 and over, adult: Secondary | ICD-10-CM

## 2023-08-19 DIAGNOSIS — Z7289 Other problems related to lifestyle: Secondary | ICD-10-CM

## 2023-08-19 DIAGNOSIS — Z7689 Persons encountering health services in other specified circumstances: Secondary | ICD-10-CM

## 2023-08-19 NOTE — Progress Notes (Signed)
 Patient Office Visit  Assessment & Plan:  Hypertension, unspecified type -     CBC with Differential/Platelet -     Comprehensive metabolic panel with GFR  BMI 40.0-44.9, adult (HCC)  Encounter to establish care -     Lipid panel  Elevated glucose -     Hemoglobin A1c  Tobacco use  Current every day vaping   Assessment and Plan    Crushing injury of right leg with persistent numbness and tingling Sustained in December, resulting in ongoing but stable symptoms.  Hypertension Well-controlled with Lisinopril  Automatic BP machines unreliable. Low-sodium diet maintained. - Order blood work to check potassium and kidney function. - Continue current antihypertensive medication regimen. - Advise on maintaining a low-sodium diet.  Elevated potassium, previously resolved, for monitoring Previous episodes resolved upon retesting. - Order blood work to recheck potassium levels.  Nicotine dependence (cigarettes and vaping) Long history of smoking, transitioned to vaping. Declined Chantix due to side effects. Declined pneumonia vaccination. - Continue vaping as a harm reduction strategy. - Discuss potential future use of Chantix if interested. patient declined today     Test results were reviewed and analyzed as part of the medical decision making of this visit. Reviewed previous notes from Dr. Levora and previous labs during office visit. Patient declined pneumococcal vaccine  Recommend healthy diet i.e mediterranean/DASH diet, consistent exercise - 30 minutes 5 day per week, and gradual weight loss.  Return in about 3 months (around 11/19/2023), or if symptoms worsen or fail to improve.   Subjective:    Patient ID: Bradley Morales, male    DOB: 03/15/1980  Age: 43 y.o. MRN: [id number]  No chief complaint on file.   HPI Discussed the use of AI scribe software for clinical note transcription with the patient, who gave verbal consent to proceed.  History of Present Illness         Bradley Morales is a 43 year old male who presents for a routine follow-up visit and establish primary care here.   He has a history of hypertension and is currently taking Lisinipril for blood pressure management. The medication is effective, although he experiences issues with automatic blood pressure machines, which often give inaccurate readings. He follows a low-salt diet, does not add salt to his food, and primarily eats at home during the week, with occasional fast food on weekends.  He has a significant smoking history, having started smoking at around 43 years old. He quit smoking for two to three years using vaping but resumed smoking after his mother was diagnosed with ovarian cancer, which has metastasized to her bones. He is currently attempting to quit smoking again using vaping, which he started a few weeks ago. He smokes about a pack a day when he does smoke.  In December of the previous year, he experienced a significant injury resulting in a crushing injury to his right leg and cracked ribs. He was trampled by a bull during a rodeo event, which led to numbness and tingling in his right leg that persists. He also sustained a laceration to his ear that required stitches.  He has a family history of cancer, with his mother currently battling ovarian cancer and his maternal uncle having died from lung cancer. His father has diabetes, and his blood sugar was slightly elevated at 102 mg/dL during his last check-up, although he does not have prediabetes.  He underwent blood work in few months ago which showed elevated potassium levels initially, but subsequent  tests were normal. He is up to date on his tetanus shot following his injury in December. Physical Exam CHEST: Lungs clear to auscultation bilaterally. Results LABS Potassium: High (03/25/2023) Blood Glucose: 102 (03/2023) Assessment & Plan Crushing injury of right leg with persistent numbness and tingling Sustained in December,  resulting in ongoing but stable symptoms.  Hypertension Well-controlled with Lisinopril  Automatic BP machines unreliable. Low-sodium diet maintained. - Order blood work to check potassium and kidney function. - Continue current antihypertensive medication regimen. - Advise on maintaining a low-sodium diet.  Elevated potassium, previously resolved, for monitoring Previous episodes resolved upon retesting. - Order blood work to recheck potassium levels.  Nicotine dependence (cigarettes and vaping) Long history of smoking, transitioned to vaping. Declined Chantix due to side effects. Declined pneumonia vaccination. - Continue vaping as a harm reduction strategy. - Discuss potential future use of Chantix if interested. patient declined today    The 10-year ASCVD risk score (Arnett DK, et al., 2019) is: 5.8%  Past Medical History:  Diagnosis Date   Bell's palsy    Hypertension    Obesity due to excess calories    Snoring disorder    Past Surgical History:  Procedure Laterality Date   CYST EXCISION N/A 09/30/2017   Procedure: EXCISION OF POSTERIOR NECK CYST AND LOWER BACK CYST;  Surgeon: Rubin Calamity, MD;  Location: [facility] OR;  Service: General;  Laterality: N/A;   VASECTOMY     WISDOM TOOTH EXTRACTION     Social History   Tobacco Use   Smoking status: Every Day    Current packs/day: 1.00    Average packs/day: 1 pack/day for 10.0 years (10.0 ttl pk-yrs)    Types: Cigarettes   Smokeless tobacco: Never   Tobacco comments:    Debating states Probably need to  Vaping Use   Vaping status: Former  Substance Use Topics   Alcohol use: Yes    Comment: rarely   Drug use: No   Family History  Problem Relation Age of Onset   Ovarian cancer Mother    Hypertension Mother    Uterine cancer Mother    Hypertension Father    Diabetes Father    Obesity Father    Varicose Veins Father    Cancer Maternal Grandmother    Lung cancer Maternal Uncle    No Known Allergies  ROS     Objective:    BP 124/80   Pulse 83   Temp 98.6 F (37 C)   Ht 5' 10 (1.778 m)   Wt 284 lb 4 oz (128.9 kg)   SpO2 99%   BMI 40.79 kg/m  BP Readings from Last 3 Encounters:  08/19/23 124/80  03/03/23 114/72  01/06/23 128/80   Wt Readings from Last 3 Encounters:  08/19/23 284 lb 4 oz (128.9 kg)  03/03/23 285 lb 4 oz (129.4 kg)  01/06/23 297 lb 12.8 oz (135.1 kg)    Physical Exam Vitals and nursing note reviewed.  Constitutional:      General: He is not in acute distress.    Appearance: Normal appearance.  HENT:     Head: Normocephalic.     Right Ear: Tympanic membrane, ear canal and external ear normal.     Left Ear: Tympanic membrane, ear canal and external ear normal.  Eyes:     Extraocular Movements: Extraocular movements intact.     Conjunctiva/sclera: Conjunctivae normal.     Pupils: Pupils are equal, round, and reactive to light.  Cardiovascular:     Rate  and Rhythm: Normal rate and regular rhythm.     Heart sounds: Normal heart sounds.  Pulmonary:     Effort: Pulmonary effort is normal.     Breath sounds: Normal breath sounds.  Musculoskeletal:     Right lower leg: No edema.     Left lower leg: No edema.     Comments: Right leg- surgical scar noted.   Neurological:     General: No focal deficit present.     Mental Status: He is alert and oriented to person, place, and time.  Psychiatric:        Mood and Affect: Mood normal.        Behavior: Behavior normal.      No results found for any visits on 08/19/23.

## 2023-08-20 LAB — COMPREHENSIVE METABOLIC PANEL WITH GFR
AG Ratio: 1.8 (calc) (ref 1.0–2.5)
ALT: 20 U/L (ref 9–46)
AST: 18 U/L (ref 10–40)
Albumin: 4.5 g/dL (ref 3.6–5.1)
Alkaline phosphatase (APISO): 68 U/L (ref 36–130)
BUN: 11 mg/dL (ref 7–25)
CO2: 27 mmol/L (ref 20–32)
Calcium: 9.4 mg/dL (ref 8.6–10.3)
Chloride: 105 mmol/L (ref 98–110)
Creat: 1.01 mg/dL (ref 0.60–1.29)
Globulin: 2.5 g/dL (ref 1.9–3.7)
Glucose, Bld: 74 mg/dL (ref 65–99)
Potassium: 4.5 mmol/L (ref 3.5–5.3)
Sodium: 139 mmol/L (ref 135–146)
Total Bilirubin: 0.5 mg/dL (ref 0.2–1.2)
Total Protein: 7 g/dL (ref 6.1–8.1)
eGFR: 96 mL/min/1.73m2 (ref >=60)

## 2023-08-20 LAB — CBC WITH DIFFERENTIAL/PLATELET
Absolute Lymphocytes: 2352 {cells}/uL (ref 850–3900)
Absolute Monocytes: 720 {cells}/uL (ref 200–950)
Basophils Absolute: 32 {cells}/uL (ref 0–200)
Basophils Relative: 0.4 %
Eosinophils Absolute: 80 {cells}/uL (ref 15–500)
Eosinophils Relative: 1 %
HCT: 48.6 % (ref 38.5–50.0)
Hemoglobin: 15.7 g/dL (ref 13.2–17.1)
MCH: 28.5 pg (ref 27.0–33.0)
MCHC: 32.3 g/dL (ref 32.0–36.0)
MCV: 88.2 fL (ref 80.0–100.0)
MPV: 11.6 fL (ref 7.5–12.5)
Monocytes Relative: 9 %
Neutro Abs: 4816 {cells}/uL (ref 1500–7800)
Neutrophils Relative %: 60.2 %
Platelets: 214 Thousand/uL (ref 140–400)
RBC: 5.51 Million/uL (ref 4.20–5.80)
RDW: 13 % (ref 11.0–15.0)
Total Lymphocyte: 29.4 %
WBC: 8 Thousand/uL (ref 3.8–10.8)

## 2023-08-20 LAB — LIPID PANEL
Cholesterol: 167 mg/dL (ref <200)
HDL: 34 mg/dL — ABNORMAL LOW (ref >=40)
LDL Cholesterol (Calc): 114 mg/dL — ABNORMAL HIGH
Non-HDL Cholesterol (Calc): 133 mg/dL — ABNORMAL HIGH (ref <130)
Total CHOL/HDL Ratio: 4.9 (calc) (ref <5.0)
Triglycerides: 86 mg/dL (ref <150)

## 2023-08-20 LAB — HEMOGLOBIN A1C
Hgb A1c MFr Bld: 5.6 % (ref <5.7)
Mean Plasma Glucose: 114 mg/dL
eAG (mmol/L): 6.3 mmol/L

## 2023-08-22 ENCOUNTER — Ambulatory Visit: Payer: Self-pay | Admitting: Family Medicine

## 2023-09-01 ENCOUNTER — Ambulatory Visit: Payer: 59 | Admitting: Family Medicine

## 2023-11-21 ENCOUNTER — Encounter: Payer: Self-pay | Admitting: Radiology

## 2023-12-02 ENCOUNTER — Encounter: Payer: Self-pay | Admitting: Family Medicine

## 2023-12-02 ENCOUNTER — Telehealth: Payer: Self-pay | Admitting: Family Medicine

## 2023-12-02 ENCOUNTER — Ambulatory Visit: Admitting: Family Medicine

## 2023-12-02 VITALS — BP 140/82 | HR 84 | Temp 98.4°F | Ht 70.0 in | Wt 284.0 lb

## 2023-12-02 DIAGNOSIS — Z720 Tobacco use: Secondary | ICD-10-CM | POA: Diagnosis not present

## 2023-12-02 DIAGNOSIS — L853 Xerosis cutis: Secondary | ICD-10-CM

## 2023-12-02 DIAGNOSIS — I10 Essential (primary) hypertension: Secondary | ICD-10-CM

## 2023-12-02 DIAGNOSIS — E7800 Pure hypercholesterolemia, unspecified: Secondary | ICD-10-CM

## 2023-12-02 MED ORDER — TRIAMCINOLONE ACETONIDE 0.1 % EX CREA
TOPICAL_CREAM | Freq: Two times a day (BID) | CUTANEOUS | 2 refills | Status: DC
Start: 2023-12-02 — End: 2023-12-05

## 2023-12-02 NOTE — Telephone Encounter (Signed)
 Copied from CRM [id number]. Topic: Clinical - Prescription Issue >> Dec 02, 2023 10:01 AM Treva T wrote: Reason for CRM: Received call from Nena, pharmacy tech at [facility], [phone]  Calling in regards to medication: med: triamcinolone  0.1%-Cetaphil equivalent 1:1 cream mixture  States they do not do any compounding medications any longer, and this prescription will need to be sent to a compounding pharmacy for patient.    [id number] - [place] ([place]), [place] - [address] [place] ([place]) [place] [address] Phone: [phone] Fax: [phone]

## 2023-12-02 NOTE — Progress Notes (Signed)
 Patient Office Visit  Assessment & Plan:  Hypertension, unspecified type  Tobacco use  Elevated LDL cholesterol level  Dry skin -     triamcinolone  0.1%-Cetaphil equivalent 1:1 cream mixture; Apply topically 2 (two) times daily. Apply to BD to affected areas  Dispense: 120 g; Refill: 2   Assessment and Plan    Essential hypertension Blood pressure slightly elevated but well-controlled on low-dose lisinopril . No side effects reported. Prefers current dosage. - Continue current dose of lisinopril . - Encouraged adequate hydration. - Re-evaluate blood pressure in four months.  Pure hypercholesterolemia Cholesterol levels slightly elevated. No family history of heart disease. Discussed potential cholesterol medication initiation. - Recheck cholesterol levels during next blood work. - Consider starting cholesterol medication if levels remain elevated.  Tobacco use Continues to vape and occasionally smoke. Not ready to quit but considering it. Discussed Chantix for cessation and side effects. - Discussed potential use of Chantix with dosage adjustment if side effects occur. - Encouraged smoking cessation efforts.  Xerosis cutis Experiences dry skin, especially behind knees, worsened by winter. Using high-end moisturizer. Discussed combination moisturizer with steroid. - Prescribed Cetaphil moisturizer with triamcinolone . - Advised using milder soap like Dove or Nivea.      Discussed tobacco sensation i.e. Chantix but patient would like to hold off for now.  Patient will follow-up in 4 months or sooner if necessary.  Blood pressure stay borderline elevated we will increase lisinopril  to 20 mg once a day.  Continue healthy diet and exercise as tolerated.  Return in about 4 months (around 03/31/2024) for hypertension.   Subjective:    Patient ID: Bradley Morales, male    DOB: [birth year]  Age: 43 y.o. MRN: [id number]  Chief Complaint  Patient presents with   Medical Management of  Chronic Issues    HPI Discussed the use of AI scribe software for clinical note transcription with the patient, who gave verbal consent to proceed.  History of Present Illness        History of Present Illness Bradley Morales is a 43 year old male with hypertension who presents for a follow-up visit.  He is currently on a low dose of lisinopril , which he tolerates well. No headaches are reported. He drinks about three cups of water a day at work.  He discusses his dietary habits, noting that he does not add extra salt to his food and has reduced his intake of fast food. He eats out approximately twice a week, with most meals being home-cooked by his wife.  His last blood work showed slightly elevated LDL cholesterol levels, but his sugars and kidney function were good. He does not have a family history of heart disease, although both parents had diabetes, and his mother passed away from cancer.  He has a history of smoking, having started at age 7 and quit at 38, but he currently vapes and occasionally smokes cigarettes.  He experiences dry skin, particularly behind his knees, which worsens in the winter. He uses a high-dollar moisturizer and applies it after showering in the morning. He also uses baby wash as soap.  Results LABS Cholesterol: Elevated  Assessment and Plan Essential hypertension Blood pressure slightly elevated but well-controlled on low-dose lisinopril . No side effects reported. Prefers current dosage. - Continue current dose of lisinopril . - Encouraged adequate hydration. - Re-evaluate blood pressure in four months.  Pure hypercholesterolemia Cholesterol levels slightly elevated. No family history of heart disease. Discussed potential cholesterol medication initiation. - Recheck cholesterol levels during  next blood work. - Consider starting cholesterol medication if levels remain elevated.  Tobacco use Continues to vape and occasionally smoke. Not ready to quit  but considering it. Discussed Chantix for cessation and side effects. - Discussed potential use of Chantix with dosage adjustment if side effects occur. - Encouraged smoking cessation efforts.  Xerosis cutis Experiences dry skin, especially behind knees, worsened by winter. Using high-end moisturizer. Discussed combination moisturizer with steroid. - Prescribed Cetaphil moisturizer with triamcinolone . - Advised using milder soap like Dove or Nivea.    The 10-year ASCVD risk score (Arnett DK, et al., 2019) is: 6.6%  Past Medical History:  Diagnosis Date   Bell's palsy    Hypertension    Obesity due to excess calories    Snoring disorder    Past Surgical History:  Procedure Laterality Date   CYST EXCISION N/A 09/30/2017   Procedure: EXCISION OF POSTERIOR NECK CYST AND LOWER BACK CYST;  Surgeon: Rubin Calamity, MD;  Location: [facility] OR;  Service: General;  Laterality: N/A;   VASECTOMY     WISDOM TOOTH EXTRACTION     Social History   Tobacco Use   Smoking status: Every Day    Current packs/day: 1.00    Average packs/day: 1 pack/day for 10.0 years (10.0 ttl pk-yrs)    Types: Cigarettes   Smokeless tobacco: Never   Tobacco comments:    Debating states Probably need to  Vaping Use   Vaping status: Former  Substance Use Topics   Alcohol use: Yes    Comment: rarely   Drug use: No   Family History  Problem Relation Age of Onset   Ovarian cancer Mother    Hypertension Mother    Uterine cancer Mother    Hypertension Father    Diabetes Father    Obesity Father    Varicose Veins Father    Cancer Maternal Grandmother    Lung cancer Maternal Uncle    No Known Allergies  ROS    Objective:    BP (!) 140/82   Pulse 84   Temp 98.4 F (36.9 C)   Ht 5' 10 (1.778 m)   Wt 284 lb (128.8 kg)   SpO2 98%   BMI 40.75 kg/m  BP Readings from Last 3 Encounters:  12/02/23 (!) 140/82  08/19/23 124/80  03/03/23 114/72   Wt Readings from Last 3 Encounters:  12/02/23 284 lb  (128.8 kg)  08/19/23 284 lb 4 oz (128.9 kg)  03/03/23 285 lb 4 oz (129.4 kg)    Physical Exam Vitals and nursing note reviewed.  Constitutional:      General: He is not in acute distress.    Appearance: Normal appearance.  HENT:     Head: Normocephalic.     Right Ear: Tympanic membrane, ear canal and external ear normal.     Left Ear: Tympanic membrane, ear canal and external ear normal.  Eyes:     Extraocular Movements: Extraocular movements intact.     Pupils: Pupils are equal, round, and reactive to light.  Cardiovascular:     Rate and Rhythm: Normal rate and regular rhythm.     Heart sounds: Normal heart sounds.  Pulmonary:     Effort: Pulmonary effort is normal.     Breath sounds: Normal breath sounds. No wheezing or rhonchi.  Musculoskeletal:     Right lower leg: No edema.     Left lower leg: No edema.  Neurological:     General: No focal deficit present.  Mental Status: He is alert and oriented to person, place, and time.  Psychiatric:        Mood and Affect: Mood normal.        Behavior: Behavior normal.      No results found for any visits on 12/02/23.

## 2023-12-05 ENCOUNTER — Other Ambulatory Visit: Payer: Self-pay

## 2023-12-05 DIAGNOSIS — L853 Xerosis cutis: Secondary | ICD-10-CM

## 2023-12-05 MED ORDER — TRIAMCINOLONE ACETONIDE 0.1 % EX CREA
TOPICAL_CREAM | Freq: Two times a day (BID) | CUTANEOUS | 2 refills | Status: AC
Start: 2023-12-05 — End: ?

## 2024-03-30 ENCOUNTER — Ambulatory Visit: Admitting: Family Medicine
